# Patient Record
Sex: Male | Born: 1950 | Race: White | Hispanic: No | Marital: Married | Smoking: Current every day smoker
Health system: Southern US, Community
[De-identification: ages and names within clinical notes are randomized; demographics above are authoritative.]

## PROBLEM LIST (undated history)

## (undated) DIAGNOSIS — G4733 Obstructive sleep apnea (adult) (pediatric): Secondary | ICD-10-CM

## (undated) DIAGNOSIS — E119 Type 2 diabetes mellitus without complications: Secondary | ICD-10-CM

## (undated) DIAGNOSIS — K219 Gastro-esophageal reflux disease without esophagitis: Secondary | ICD-10-CM

## (undated) DIAGNOSIS — H269 Unspecified cataract: Secondary | ICD-10-CM

## (undated) DIAGNOSIS — E079 Disorder of thyroid, unspecified: Secondary | ICD-10-CM

## (undated) DIAGNOSIS — C801 Malignant (primary) neoplasm, unspecified: Secondary | ICD-10-CM

## (undated) DIAGNOSIS — G4730 Sleep apnea, unspecified: Secondary | ICD-10-CM

## (undated) DIAGNOSIS — R740 Nonspecific elevation of levels of transaminase and lactic acid dehydrogenase [LDH]: Secondary | ICD-10-CM

## (undated) DIAGNOSIS — R7309 Other abnormal glucose: Secondary | ICD-10-CM

## (undated) DIAGNOSIS — G7000 Myasthenia gravis without (acute) exacerbation: Secondary | ICD-10-CM

## (undated) HISTORY — DX: Myasthenia gravis without (acute) exacerbation: G70.00

## (undated) HISTORY — PX: POLYPECTOMY: SHX149

## (undated) HISTORY — DX: Nonspecific elevation of levels of transaminase and lactic acid dehydrogenase (ldh): R74.0

## (undated) HISTORY — DX: Unspecified cataract: H26.9

## (undated) HISTORY — DX: Sleep apnea, unspecified: G47.30

## (undated) HISTORY — DX: Obstructive sleep apnea (adult) (pediatric): G47.33

## (undated) HISTORY — DX: Disorder of thyroid, unspecified: E07.9

## (undated) HISTORY — DX: Gastro-esophageal reflux disease without esophagitis: K21.9

## (undated) HISTORY — DX: Other abnormal glucose: R73.09

## (undated) HISTORY — DX: Type 2 diabetes mellitus without complications: E11.9

## (undated) HISTORY — DX: Malignant (primary) neoplasm, unspecified: C80.1

---

## 1996-10-06 HISTORY — PX: HERNIA REPAIR: SHX51

## 2004-11-12 ENCOUNTER — Ambulatory Visit (HOSPITAL_BASED_OUTPATIENT_CLINIC_OR_DEPARTMENT_OTHER): Admission: RE | Admit: 2004-11-12 | Discharge: 2004-11-12 | Payer: Self-pay | Admitting: Family Medicine

## 2004-11-12 ENCOUNTER — Ambulatory Visit: Payer: Self-pay | Admitting: Internal Medicine

## 2004-12-03 ENCOUNTER — Encounter: Payer: Self-pay | Admitting: Family Medicine

## 2004-12-03 ENCOUNTER — Ambulatory Visit (HOSPITAL_BASED_OUTPATIENT_CLINIC_OR_DEPARTMENT_OTHER): Admission: RE | Admit: 2004-12-03 | Discharge: 2004-12-03 | Payer: Self-pay | Admitting: Family Medicine

## 2005-10-06 HISTORY — PX: BASAL CELL CARCINOMA EXCISION: SHX1214

## 2009-10-01 ENCOUNTER — Encounter: Payer: Self-pay | Admitting: Family Medicine

## 2009-12-12 ENCOUNTER — Ambulatory Visit: Payer: Self-pay | Admitting: Family Medicine

## 2009-12-12 DIAGNOSIS — K219 Gastro-esophageal reflux disease without esophagitis: Secondary | ICD-10-CM | POA: Insufficient documentation

## 2009-12-12 DIAGNOSIS — R7401 Elevation of levels of liver transaminase levels: Secondary | ICD-10-CM | POA: Insufficient documentation

## 2009-12-12 DIAGNOSIS — R7309 Other abnormal glucose: Secondary | ICD-10-CM

## 2009-12-12 DIAGNOSIS — E1165 Type 2 diabetes mellitus with hyperglycemia: Secondary | ICD-10-CM | POA: Insufficient documentation

## 2009-12-12 DIAGNOSIS — R740 Nonspecific elevation of levels of transaminase and lactic acid dehydrogenase [LDH]: Secondary | ICD-10-CM

## 2009-12-12 HISTORY — DX: Gastro-esophageal reflux disease without esophagitis: K21.9

## 2009-12-12 HISTORY — DX: Other abnormal glucose: R73.09

## 2009-12-12 HISTORY — DX: Elevation of levels of liver transaminase levels: R74.01

## 2010-02-13 ENCOUNTER — Ambulatory Visit: Payer: Self-pay | Admitting: Family Medicine

## 2010-02-13 LAB — CONVERTED CEMR LAB: Blood Glucose, Fingerstick: 125

## 2010-02-14 LAB — CONVERTED CEMR LAB
Albumin: 4.3 g/dL (ref 3.5–5.2)
Alkaline Phosphatase: 62 units/L (ref 39–117)
Bilirubin, Direct: 0.1 mg/dL (ref 0.0–0.3)
Hgb A1c MFr Bld: 6.3 % (ref 4.6–6.5)
Total Protein: 6.8 g/dL (ref 6.0–8.3)

## 2010-03-07 ENCOUNTER — Encounter: Payer: Self-pay | Admitting: Family Medicine

## 2010-04-20 ENCOUNTER — Encounter: Payer: Self-pay | Admitting: Family Medicine

## 2010-08-14 ENCOUNTER — Ambulatory Visit: Payer: Self-pay | Admitting: Family Medicine

## 2010-08-14 DIAGNOSIS — G4733 Obstructive sleep apnea (adult) (pediatric): Secondary | ICD-10-CM | POA: Insufficient documentation

## 2010-08-14 HISTORY — DX: Obstructive sleep apnea (adult) (pediatric): G47.33

## 2010-08-16 LAB — CONVERTED CEMR LAB
ALT: 38 units/L (ref 0–53)
AST: 25 units/L (ref 0–37)
Bilirubin, Direct: 0.1 mg/dL (ref 0.0–0.3)
Hgb A1c MFr Bld: 6.4 % (ref 4.6–6.5)
Total Bilirubin: 0.9 mg/dL (ref 0.3–1.2)

## 2010-08-20 ENCOUNTER — Encounter: Payer: Self-pay | Admitting: Family Medicine

## 2010-11-05 NOTE — Assessment & Plan Note (Signed)
Summary: PT NEW TO [facility]/TO EST/CJR   Vital Signs:  Patient profile:   60 year old male Height:      70.75 inches Weight:      281 pounds BMI:     39.61 Temp:     98.6 degrees F oral Pulse rate:   60 / minute Pulse rhythm:   regular Resp:     12 per minute BP sitting:   130 / 80  (left arm) Cuff size:   large  Vitals Entered By: Sid Falcon LPN (December 12, 1608 10:16 AM)  Nutrition Counseling: Patient's BMI is greater than 25 and therefore counseled on weight management options. CC: New pt to establish   History of Present Illness: New patient to establish care.  Past medical history reviewed. History of basal cell skin cancer 2007. History of mild reflux controlled with over-the-counter ranitidine.  Recent history is that he had life insurance physical significant for elevated glucose 130 with hemoglobin A1c 7.3%. No prior history of diabetes. Denies any thirst, weight loss or urine frequency.  diabetes mother and grandmother. Father had history of atrial fib.  Patient does not exercise.  poor dietary compliance. No visual difficulties.  patient also had mildly elevated liver transaminases on lab work. No alcohol use. No history of risk factors for hepatitis B or C.  Date of last tetanus unknown. No history of Pneumovax. Smokes cigars  Preventive Screening-Counseling & Management  Alcohol-Tobacco     Smoking Status: current     Year Started: 1974  Caffeine-Diet-Exercise     Does Patient Exercise: no  Allergies (verified): No Known Drug Allergies  Past History:  Family History: Last updated: 12/12/2009 Father, heart disease Mother, Type ll diabetes Both grandfathers maternal grandmother, type ll diabetes  Social History: Last updated: 12/12/2009 Occupation:  Dentist Married Current Smoker, cigars Regular exercise-no Alcohol use-no  Risk Factors: Exercise: no (12/12/2009)  Risk Factors: Smoking Status: current (12/12/2009)  Past Medical  History: Skin cancer, face GERD HIV testing  Past Surgical History: Umbilical hernia 1998 MOHS, basal cell CA face 2007  Family History: Father, heart disease Mother, Type ll diabetes Both grandfathers maternal grandmother, type ll diabetes  Social History: Occupation:  Dentist Married Current Smoker, cigars Regular exercise-no Alcohol use-no Smoking Status:  current Occupation:  employed Does Patient Exercise:  no  Review of Systems  The patient denies anorexia, fever, weight loss, weight gain, vision loss, chest pain, syncope, dyspnea on exertion, peripheral edema, prolonged cough, headaches, hemoptysis, abdominal pain, melena, hematochezia, severe indigestion/heartburn, muscle weakness, and suspicious skin lesions.    Physical Exam  General:  Well-developed,well-nourished,in no acute distress; alert,appropriate and cooperative throughout examination Head:  Normocephalic and atraumatic without obvious abnormalities. No apparent alopecia or balding. Eyes:  pupils equal, pupils round, and pupils reactive to light.   Ears:  External ear exam shows no significant lesions or deformities.  Otoscopic examination reveals clear canals, tympanic membranes are intact bilaterally without bulging, retraction, inflammation or discharge. Hearing is grossly normal bilaterally. Mouth:  Oral mucosa and oropharynx without lesions or exudates.  Teeth in good repair. Neck:  No deformities, masses, or tenderness noted. Lungs:  Normal respiratory effort, chest expands symmetrically. Lungs are clear to auscultation, no crackles or wheezes. Heart:  Normal rate and regular rhythm. S1 and S2 normal without gallop, murmur, click, rub or other extra sounds. Abdomen:  soft and non-tender.   Extremities:  No clubbing, cyanosis, edema, or deformity noted with normal full range of motion of all joints.  Neurologic:  alert & oriented X3, cranial nerves II-XII intact, and strength normal in all  extremities.     Impression & Recommendations:  Problem # 1:  HYPERGLYCEMIA (ICD-790.29) patient had a one-time fasting glucose 130. Discussed lifestyle management. Work on weight loss and reduce sugars and starches and reassess fasting glucose within 2 months along with repeat A1c  Problem # 2:  GERD (ICD-530.81)  His updated medication list for this problem includes:    Zantac 150 Mg Tabs (Ranitidine hcl) .Marland Kitchen... At bedtime daily  Problem # 3:  Preventive Health Care (ICD-V70.0) Tdap and Pneumovax recommended and patient consents and these are both given.  Problem # 4:  TRANSAMINASES, SERUM, ELEVATED (ICD-790.4) probably fatty liver related. Recheck in 2 months.  Complete Medication List: 1)  Zantac 150 Mg Tabs (Ranitidine hcl) .... At bedtime daily  Other Orders: Tdap => 40yrs IM (21308) Pneumococcal Vaccine (65784) Admin 1st Vaccine (69629) Admin of Any Addtl Vaccine (52841)  Patient Instructions: 1)  It is important that you exercise reguarly at least 20 minutes 5 times a week. If you develop chest pain, have severe difficulty breathing, or feel very tired, stop exercising immediately and seek medical attention.  2)  You need to lose weight. Consider a lower calorie diet and regular exercise.  3)  Reduce overall sugars and starches and diet. 4)  Please schedule a follow-up appointment in 2 months FASTING and we will plan to repeat A1C, liver enzymes, and Glucose then.   Immunizations Administered:  Tetanus Vaccine:    Vaccine Type: Tdap    Site: left deltoid    Mfr: GlaxoSmithKline    Dose: 0.5 ml    Route: IM    Given by: Sid Falcon LPN    Exp. Date: 12/01/2011    Lot #: [id number]  Pneumonia Vaccine:    Vaccine Type: Pneumovax    Site: right deltoid    Mfr: Merck    Dose: 0.5 ml    Route: IM    Given by: Sid Falcon LPN    Exp. Date: 01/24/2011    Lot #: [id number]

## 2010-11-05 NOTE — Assessment & Plan Note (Signed)
Summary: 6 month rov/njr   Vital Signs:  Patient profile:   60 year old male Weight:      269 pounds Temp:     98.7 degrees F oral BP sitting:   110 / 78  (left arm) Cuff size:   large  Vitals Entered By: Sid Falcon LPN (August 14, 2010 8:21 AM) CBG Result 141   History of Present Illness: Patient seen in followup hyperglycemia. Not monitoring blood sugars at home. Last A1c 6.3%. No symptoms of hyperglycemia. Poor compliance with diet at times. Also history of elevated liver transaminases which have been mildly elevated. History of GERD well-controlled. Obstructive sleep apnea using CPAP consistently but no reported problems.  no regular ETOH use.   Allergies (verified): No Known Drug Allergies  Past History:  Past Medical History: Last updated: 02/13/2010 Basal Cell Ca, face GERD Elevated blood glucose  Past Surgical History: Last updated: 12/12/2009 Umbilical hernia 1998 MOHS, basal cell CA face 2007  Family History: Last updated: 12/12/2009 Father, heart disease Mother, Type ll diabetes Both grandfathers maternal grandmother, type ll diabetes  Social History: Last updated: 12/12/2009 Occupation:  Dentist Married Current Smoker, cigars Regular exercise-no Alcohol use-no  Risk Factors: Exercise: no (12/12/2009)  Risk Factors: Smoking Status: current (02/13/2010) PMH-FH-SH reviewed for relevance  Review of Systems  The patient denies anorexia, fever, weight loss, weight gain, chest pain, syncope, dyspnea on exertion, headaches, hemoptysis, abdominal pain, melena, hematochezia, and severe indigestion/heartburn.    Physical Exam  General:  Well-developed,well-nourished,in no acute distress; alert,appropriate and cooperative throughout examination Mouth:  Oral mucosa and oropharynx without lesions or exudates.  Teeth in good repair. Neck:  No deformities, masses, or tenderness noted. Lungs:  Normal respiratory effort, chest expands  symmetrically. Lungs are clear to auscultation, no crackles or wheezes. Heart:  Normal rate and regular rhythm. S1 and S2 normal without gallop, murmur, click, rub or other extra sounds. Extremities:  No clubbing, cyanosis, edema, or deformity noted with normal full range of motion of all joints.   Psych:  normally interactive, good eye contact, not anxious appearing, and not depressed appearing.     Impression & Recommendations:  Problem # 1:  HYPERGLYCEMIA (ICD-790.29) diet and weight loss discussed. Orders: TLB-A1C / Hgb A1C (Glycohemoglobin) (83036-A1C) Venipuncture (96295) Specimen Handling (28413)  Problem # 2:  TRANSAMINASES, SERUM, ELEVATED (ICD-790.4)  Orders: TLB-Hepatic/Liver Function Pnl (80076-HEPATIC) Venipuncture (24401) Specimen Handling (02725)  Problem # 3:  OBSTRUCTIVE SLEEP APNEA (ICD-327.23)  Problem # 4:  GERD (ICD-530.81)  His updated medication list for this problem includes:    Zantac 150 Mg Tabs (Ranitidine hcl) .Marland Kitchen... At bedtime daily  Complete Medication List: 1)  Zantac 150 Mg Tabs (Ranitidine hcl) .... At bedtime daily  Other Orders: Capillary Blood Glucose/CBG (36644)  Patient Instructions: 1)  Please schedule a follow-up appointment in 6 months .  2)  It is important that you exercise reguarly at least 20 minutes 5 times a week. If you develop chest pain, have severe difficulty breathing, or feel very tired, stop exercising immediately and seek medical attention.  3)  You need to lose weight. Consider a lower calorie diet and regular exercise.    Orders Added: 1)  TLB-A1C / Hgb A1C (Glycohemoglobin) [83036-A1C] 2)  TLB-Hepatic/Liver Function Pnl [80076-HEPATIC] 3)  Capillary Blood Glucose/CBG [82948] 4)  Venipuncture [03474] 5)  Specimen Handling [99000] 6)  Est. Patient Level IV [25956]  Appended Document: 6 month rov/njr    Clinical Lists Changes  Medications: Added new medication  of FREESTYLE FREEDOM LITE W/DEVICE KIT (BLOOD  GLUCOSE MONITORING SUPPL) Disp on 11/9 for BS checks as needed Added new medication of FREESTYLE LANCETS  MISC (LANCETS) as needed Added new medication of FREESTYLE TEST  STRP (GLUCOSE BLOOD) prn

## 2010-11-05 NOTE — Medication Information (Signed)
Summary: Order for CPAP Supplies/[facility]  Order for CPAP Supplies/[facility]   Imported By: Maryln Gottron 03/11/2010 10:25:10  _____________________________________________________________________  External Attachment:    Type:   Image     Comment:   External Document

## 2010-11-05 NOTE — Assessment & Plan Note (Signed)
Summary: 2 month rov/pt will come in fasting for labs/njr   Vital Signs:  Patient profile:   60 year old male Weight:      268 pounds Temp:     97.8 degrees F oral BP sitting:   102 / 70  (left arm) Cuff size:   large  Vitals Entered By: Sid Falcon LPN (Feb 13, 2010 8:23 AM) CC: 2 month follow-up CBG Result 125   History of Present Illness: Followup type 2 diabetes. Recent fasting blood sugar 130 with A1c 7.3%. Patient has lost 13 pounds since last visit with dietary modification. No symptoms of hyperglycemia. Currently on no medications. Has greatly reduced carbohydrate intake. Also recent mild elevation in liver transaminases and we planned followup today.  Preventive Screening-Counseling & Management  Alcohol-Tobacco     Smoking Status: current  Comments: currently smoking an occasional cigar  Allergies (verified): No Known Drug Allergies  Past History:  Past Medical History: Basal Cell Ca, face GERD Elevated blood glucose  Review of Systems  The patient denies chest pain, syncope, dyspnea on exertion, peripheral edema, and abdominal pain.    Physical Exam  General:  Well-developed,well-nourished,in no acute distress; alert,appropriate and cooperative throughout examination Mouth:  Oral mucosa and oropharynx without lesions or exudates.  Teeth in good repair. Neck:  No deformities, masses, or tenderness noted. Lungs:  Normal respiratory effort, chest expands symmetrically. Lungs are clear to auscultation, no crackles or wheezes. Heart:  Normal rate and regular rhythm. S1 and S2 normal without gallop, murmur, click, rub or other extra sounds.   Impression & Recommendations:  Problem # 1:  HYPERGLYCEMIA (ICD-790.29) Assessment Improved  Orders: Glucose, (CBG) (16109) Venipuncture (60454) TLB-A1C / Hgb A1C (Glycohemoglobin) (83036-A1C)  Problem # 2:  TRANSAMINASES, SERUM, ELEVATED (ICD-790.4) prob related to fatty liver changes. Orders: Venipuncture  (09811) TLB-Hepatic/Liver Function Pnl (80076-HEPATIC)  Complete Medication List: 1)  Zantac 150 Mg Tabs (Ranitidine hcl) .... At bedtime daily  Patient Instructions: 1)  It is important that you exercise reguarly at least 20 minutes 5 times a week. If you develop chest pain, have severe difficulty breathing, or feel very tired, stop exercising immediately and seek medical attention.  2)  You need to lose weight. Consider a lower calorie diet and regular exercise.  3)  Check your blood sugars regularly. If your readings are usually above:130  or below 70 you should contact our office.  4)  Please schedule a follow-up appointment in 6 months .

## 2011-02-07 ENCOUNTER — Encounter: Payer: Self-pay | Admitting: Family Medicine

## 2011-02-12 ENCOUNTER — Ambulatory Visit: Payer: Self-pay | Admitting: Family Medicine

## 2011-02-18 ENCOUNTER — Encounter: Payer: Self-pay | Admitting: Family Medicine

## 2011-02-19 ENCOUNTER — Ambulatory Visit (INDEPENDENT_AMBULATORY_CARE_PROVIDER_SITE_OTHER): Payer: PRIVATE HEALTH INSURANCE | Admitting: Family Medicine

## 2011-02-19 ENCOUNTER — Encounter: Payer: Self-pay | Admitting: Family Medicine

## 2011-02-19 DIAGNOSIS — E119 Type 2 diabetes mellitus without complications: Secondary | ICD-10-CM

## 2011-02-19 DIAGNOSIS — R7401 Elevation of levels of liver transaminase levels: Secondary | ICD-10-CM

## 2011-02-19 DIAGNOSIS — R739 Hyperglycemia, unspecified: Secondary | ICD-10-CM

## 2011-02-19 DIAGNOSIS — E785 Hyperlipidemia, unspecified: Secondary | ICD-10-CM

## 2011-02-19 DIAGNOSIS — R7309 Other abnormal glucose: Secondary | ICD-10-CM

## 2011-02-19 LAB — LIPID PANEL
HDL: 33.7 mg/dL — ABNORMAL LOW (ref >39.00)
Total CHOL/HDL Ratio: 4
Triglycerides: 118 mg/dL (ref 0.0–149.0)
VLDL: 23.6 mg/dL (ref 0.0–40.0)

## 2011-02-19 LAB — HEMOGLOBIN A1C: Hgb A1c MFr Bld: 6.4 % (ref 4.6–6.5)

## 2011-02-19 LAB — HEPATIC FUNCTION PANEL
ALT: 46 U/L (ref 0–53)
AST: 28 U/L (ref 0–37)
Albumin: 4.1 g/dL (ref 3.5–5.2)
Total Bilirubin: 0.8 mg/dL (ref 0.3–1.2)

## 2011-02-19 LAB — GLUCOSE, POCT (MANUAL RESULT ENTRY): POC Glucose: 139

## 2011-02-19 NOTE — Patient Instructions (Signed)
Work on weight loss and more consistent exercise.

## 2011-02-19 NOTE — Progress Notes (Signed)
  Subjective:    Patient ID: Rodney Martinez, male    DOB: April 30, 1951, 61 y.o.   MRN: [id number]  HPI Patient has history of type 2 diabetes currently not treated with medication. Not monitoring blood sugars at home. No symptoms of hyperglycemia. Fasting blood sugar here today 139. Last A1c 6.4%. No consistent exercise. Fairly compliant with diet. Also history of obstructive sleep apnea. Uses CPAP and that seems to be working well. History of mildly elevated liver transaminases. No alcohol use.   Review of Systems  Constitutional: Negative for fever, chills, activity change, fatigue and unexpected weight change.  Respiratory: Negative for cough and shortness of breath.   Cardiovascular: Negative for chest pain, palpitations and leg swelling.  Gastrointestinal: Negative for abdominal pain.  Neurological: Negative for dizziness and headaches.       Objective:   Physical Exam  Constitutional: He appears well-developed and well-nourished.  HENT:  Right Ear: External ear normal.  Left Ear: External ear normal.  Neck: Neck supple. No thyromegaly present.  Cardiovascular: Normal rate, regular rhythm and normal heart sounds.   No murmur heard. Pulmonary/Chest: Effort normal and breath sounds normal. No respiratory distress. He has no wheezes. He has no rales.  Musculoskeletal: He exhibits no edema.  Lymphadenopathy:    He has no cervical adenopathy.          Assessment & Plan:  #1 type 2 diabetes. Reassess A1c today #2 history of reported mild hyperlipidemia. Recheck lipids and hepatic panel

## 2011-02-20 NOTE — Progress Notes (Signed)
Quick Note:  Pt wife informed ______ 

## 2011-02-21 NOTE — Procedures (Signed)
NAME:  Rodney Martinez, Rodney Martinez NO.:  [id number]   MEDICAL RECORD NO.:  [id number]          PATIENT TYPE:  OUT   LOCATION:  SLEEP CENTER                 FACILITY:  [facility]   PHYSICIAN:  Clinton D. Maple Hudson, M.D. DATE OF BIRTH:  [birth year]   DATE OF STUDY:  12/03/2004                              NOCTURNAL POLYSOMNOGRAM   REFERRING PHYSICIAN:  Dara Hoyer, MD   INDICATIONS FOR STUDY:  Hypersomnia with sleep apnea. Epworth sleepiness  score 14/24, BMI 37, weight 260 pounds. A diagnostic NPSG on November 12, 2004, recorded RDI of 75 per  hour. CPAP titration is requested.   SLEEP ARCHITECTURE:  Total sleep time 419 minutes with sleep efficiency of  89%. Stage I was 4%, stage II was 80%, stages III and IV were 3%, REM was  12% of total sleep time. Latency to sleep onset 11 minutes. Latency to REM  109 minutes. Awake after sleep onset 39 minutes. Arousal index 13. No  medication taken.   RESPIRATORY DATA:  CPAP titration protocol. CPAP was titrated to 16 CWP, RDI  1.9 per hour, using a large Resmed Ultra Mirage full face mask with heated  humidifier. Most events were stressed with residual snoring and pressures  above 12 CWP. Suggest initial pressure trial of 16 CWP understanding it can  be turned down if a few steps if necessary for comfort at home.   OXYGEN DATA:  Mild to moderate snoring finally suppressed by CPAP. Oxygen  nadir of 74% which corrected by CPAP to a mean oxygen saturation of 94% to  95% on room air.   CARDIAC DATA:  Sinus rhythm with PVCs including some intervals of  ventricular trigeminy not clearly associated with respiratory events.   MOVEMENT/PARASOMNIA:  127 limbs were recorded, but very few of these could  be  identified as causing sleep disturbance. Bathroom times one.   IMPRESSION/RECOMMENDATIONS:  1.  Successful CPAP titration to 16 CWP, RDI of 1.9 per hour  using a large      Resmed Ultra Mirage full face mask with heated humidifier. See comments      above.  2.  Previous diagnostic NPSG on November 12, 2004, recorded an RDI of 75 per      hour.  3.  Short intervals of ventricular trigeminy.    CDY/MEDQ  D:  12/08/2004 08:33:57  T:  12/08/2004 09:20:00  Job:  [id number]

## 2011-02-21 NOTE — Procedures (Signed)
NAME:  Rodney Martinez, Rodney Martinez NO.:  [id number]   MEDICAL RECORD NO.:  [id number]          PATIENT TYPE:  OUT   LOCATION:  SLEEP CENTER                 FACILITY:  [facility]   PHYSICIAN:  Clinton D. Maple Hudson, M.D. DATE OF BIRTH:  03-Dec-1950   DATE OF STUDY:  11/12/2004                              NOCTURNAL POLYSOMNOGRAM   REFFERING PHYSICIAN:  Dara Hoyer, MD   INDICATIONS FOR STUDY:  Hypersomnia with sleep apnea. Epworth sleepiness  score 15/24, BMI 37, weight 260 pounds.   SLEEP ARCHITECTURE:  Total sleep time 335 minutes with sleep efficiency of  75%. Stage I was 5%, stage II was 78%, stages III and IV were absent, REM  was 17% of total sleep time. Latency to sleep onset 99 minutes. Latency to  REM 80 minutes. Awake after sleep onset 12 minutes. Arousal index increased  at 42.   RESPIRATORY DATA:  NPSG protocol. Respiratory disturbance index (RDI) 75.1  per hour indicating severe obstructive sleep apnea/hypopnea syndrome. This  included 129 obstructive apneas and 291 hypopneas. Events were not  positional. REM RDI of 48.   OXYGEN DATA:  Loud snoring with oxygen desaturation to a nadir of 78%. Mean  oxygen saturation through this study was 92% to 93% on room air.   CARDIAC DATA:  Sinus rhythm with frequent PVCs and intervals of ventricular  bigeminy not necessarily associated with apneas.   MOVEMENT/PARASOMNIA:  Occasional leg jerks with insignificant impact on  sleep.   IMPRESSION/RECOMMENDATIONS:  1.  Severe obstructive sleep apnea/hypopnea syndrome, RDI 75.1 per hour with      oxygen desaturation to 78% with loud snoring.  2.  Occasional ventricular bigeminy.  3.  Consider return for CPAP titration or evaluate for alternative therapies      as appropriate.      CDY/MEDQ  D:  11/17/2004 10:46:12  T:  11/17/2004 19:35:11  Job:  [id number]

## 2011-08-22 ENCOUNTER — Ambulatory Visit: Payer: PRIVATE HEALTH INSURANCE | Admitting: Family Medicine

## 2011-09-03 ENCOUNTER — Ambulatory Visit: Payer: PRIVATE HEALTH INSURANCE | Admitting: Family Medicine

## 2011-10-15 ENCOUNTER — Encounter: Payer: Self-pay | Admitting: Family Medicine

## 2011-10-15 ENCOUNTER — Ambulatory Visit (INDEPENDENT_AMBULATORY_CARE_PROVIDER_SITE_OTHER): Payer: PRIVATE HEALTH INSURANCE | Admitting: Family Medicine

## 2011-10-15 VITALS — BP 100/68 | Temp 97.8°F | Wt 278.0 lb

## 2011-10-15 DIAGNOSIS — R7309 Other abnormal glucose: Secondary | ICD-10-CM

## 2011-10-15 LAB — HEMOGLOBIN A1C: Hgb A1c MFr Bld: 6.5 % (ref 4.6–6.5)

## 2011-10-15 NOTE — Progress Notes (Signed)
  Subjective:    Patient ID: Rodney Martinez, male    DOB: Oct 28, 1950, 61 y.o.   MRN: [id number]  HPI  Medical followup. History of hyperglycemia. Most recent A1c 6.4%. Not monitoring home blood sugars. No symptoms of hyperglycemia. Poor compliance with diet and exercise. Has gained 5 pounds since last visit. Continues to smoke cigars. Prior history of mild elevated liver transaminases. No consistent alcohol use. History of GERD stable. Has not had complete physical in quite some time  Review of Systems  Constitutional: Negative for fatigue and unexpected weight change.  Respiratory: Negative for cough and shortness of breath.   Cardiovascular: Negative for chest pain, palpitations and leg swelling.       Objective:   Physical Exam  Constitutional: He appears well-developed and well-nourished.  HENT:  Mouth/Throat: Oropharynx is clear and moist.  Neck: Neck supple.  Cardiovascular: Normal rate and regular rhythm.   Pulmonary/Chest: Effort normal and breath sounds normal. No respiratory distress. He has no wheezes. He has no rales.  Musculoskeletal: He exhibits no edema.  Lymphadenopathy:    He has no cervical adenopathy.          Assessment & Plan:  Type 2 diabetes. History of stable control. Recheck A1c. Schedule complete physical. Work on weight loss

## 2011-10-16 NOTE — Progress Notes (Signed)
Quick Note:  Pt aware ______ 

## 2012-01-14 ENCOUNTER — Encounter: Payer: Self-pay | Admitting: Family Medicine

## 2012-01-14 ENCOUNTER — Ambulatory Visit (INDEPENDENT_AMBULATORY_CARE_PROVIDER_SITE_OTHER): Payer: PRIVATE HEALTH INSURANCE | Admitting: Family Medicine

## 2012-01-14 VITALS — BP 102/62 | HR 60 | Temp 98.1°F | Resp 12 | Ht 71.0 in | Wt 277.0 lb

## 2012-01-14 DIAGNOSIS — E119 Type 2 diabetes mellitus without complications: Secondary | ICD-10-CM

## 2012-01-14 DIAGNOSIS — Z0000 Encounter for general adult medical examination without abnormal findings: Secondary | ICD-10-CM

## 2012-01-14 LAB — CBC WITH DIFFERENTIAL/PLATELET
Basophils Absolute: 0 10*3/uL (ref 0.0–0.1)
Basophils Relative: 0.3 % (ref 0.0–3.0)
Eosinophils Absolute: 0.2 10*3/uL (ref 0.0–0.7)
HCT: 42.4 % (ref 39.0–52.0)
Hemoglobin: 14.4 g/dL (ref 13.0–17.0)
Lymphs Abs: 3.4 10*3/uL (ref 0.7–4.0)
MCHC: 34 g/dL (ref 30.0–36.0)
MCV: 93.7 fl (ref 78.0–100.0)
Monocytes Absolute: 0.5 10*3/uL (ref 0.1–1.0)
Neutro Abs: 3.3 10*3/uL (ref 1.4–7.7)
RBC: 4.53 Mil/uL (ref 4.22–5.81)
RDW: 12.6 % (ref 11.5–14.6)

## 2012-01-14 LAB — LIPID PANEL
Cholesterol: 146 mg/dL (ref 0–200)
Triglycerides: 102 mg/dL (ref 0.0–149.0)

## 2012-01-14 LAB — BASIC METABOLIC PANEL
BUN: 16 mg/dL (ref 6–23)
Chloride: 107 mEq/L (ref 96–112)
Creatinine, Ser: 1 mg/dL (ref 0.4–1.5)
GFR: 79.23 mL/min (ref >60.00)

## 2012-01-14 LAB — POCT URINALYSIS DIPSTICK
Ketones, UA: NEGATIVE
Leukocytes, UA: NEGATIVE
Nitrite, UA: NEGATIVE
Protein, UA: NEGATIVE
Urobilinogen, UA: 0.2

## 2012-01-14 LAB — HEPATIC FUNCTION PANEL
ALT: 55 U/L — ABNORMAL HIGH (ref 0–53)
Albumin: 4.4 g/dL (ref 3.5–5.2)
Bilirubin, Direct: 0.1 mg/dL (ref 0.0–0.3)
Total Protein: 6.8 g/dL (ref 6.0–8.3)

## 2012-01-14 LAB — PSA: PSA: 0.34 ng/mL (ref 0.10–4.00)

## 2012-01-14 NOTE — Progress Notes (Signed)
  Subjective:    Patient ID: Rodney Martinez, male    DOB: November 12, 1950, 61 y.o.   MRN: [id number]  HPI  Patient here for complete physical. He continues to smoke cigars. Type 2 diabetes which has been well controlled. Currently not treated with medications. He has never had shingles vaccine. Tetanus is up-to-date. No history of screening colonoscopy.  He does have some frequent loose stools intermittently for about 2 years now. Never bloody stools. No appetite or weight changes.  Past Medical History  Diagnosis Date  . OBSTRUCTIVE SLEEP APNEA 08/14/2010  . GERD 12/12/2009  . HYPERGLYCEMIA 12/12/2009  . TRANSAMINASES, SERUM, ELEVATED 12/12/2009   Past Surgical History  Procedure Date  . Hernia repair 1998    umbilical  . Basal cell carcinoma excision 2007    face (Mohs)    reports that he has been smoking Cigars.  He does not have any smokeless tobacco history on file. His alcohol and drug histories not on file. family history includes Diabetes in his maternal grandfather, mother, paternal grandfather, and paternal grandmother and Heart disease in his father. No Known Allergies    Review of Systems  Constitutional: Negative for fever, activity change, appetite change and fatigue.  HENT: Negative for ear pain, congestion and trouble swallowing.   Eyes: Negative for pain and visual disturbance.  Respiratory: Negative for cough, shortness of breath and wheezing.   Cardiovascular: Negative for chest pain and palpitations.  Gastrointestinal: Negative for nausea, vomiting, abdominal pain, constipation, blood in stool, abdominal distention and rectal pain.  Genitourinary: Negative for dysuria, hematuria and testicular pain.  Musculoskeletal: Negative for joint swelling and arthralgias.  Skin: Negative for rash.  Neurological: Negative for dizziness, syncope and headaches.  Hematological: Negative for adenopathy.  Psychiatric/Behavioral: Negative for confusion and dysphoric mood.         Objective:   Physical Exam  Constitutional: He is oriented to person, place, and time. He appears well-developed and well-nourished. No distress.  HENT:  Head: Normocephalic and atraumatic.  Right Ear: External ear normal.  Left Ear: External ear normal.  Mouth/Throat: Oropharynx is clear and moist.  Eyes: Conjunctivae and EOM are normal. Pupils are equal, round, and reactive to light.  Neck: Normal range of motion. Neck supple. No thyromegaly present.  Cardiovascular: Normal rate, regular rhythm and normal heart sounds.   No murmur heard. Pulmonary/Chest: No respiratory distress. He has no wheezes. He has no rales.  Abdominal: Soft. Bowel sounds are normal. He exhibits no distension and no mass. There is no tenderness. There is no rebound and no guarding.  Genitourinary: Rectum normal and prostate normal.  Musculoskeletal: He exhibits no edema.  Lymphadenopathy:    He has no cervical adenopathy.  Neurological: He is alert and oriented to person, place, and time. He displays normal reflexes. No cranial nerve deficit.  Skin: No rash noted.  Psychiatric: He has a normal mood and affect.          Assessment & Plan:  Complete physical. Check on coverage for shingles vaccine. Obtain screening lab work. Refer for screening colonoscopy. Diabetic education materials given. Consider baby aspirin one daily

## 2012-01-14 NOTE — Patient Instructions (Signed)
We will set up screening colonoscopy Check on insurance coverage for shingles vaccine Try to quit smoking Consider baby aspirin one daily

## 2012-01-20 NOTE — Progress Notes (Signed)
Quick Note:  Pt informed ______ 

## 2012-02-16 ENCOUNTER — Ambulatory Visit (INDEPENDENT_AMBULATORY_CARE_PROVIDER_SITE_OTHER): Payer: PRIVATE HEALTH INSURANCE | Admitting: Family Medicine

## 2012-02-16 ENCOUNTER — Encounter: Payer: Self-pay | Admitting: Internal Medicine

## 2012-02-16 DIAGNOSIS — Z2911 Encounter for prophylactic immunotherapy for respiratory syncytial virus (RSV): Secondary | ICD-10-CM

## 2012-02-16 DIAGNOSIS — Z299 Encounter for prophylactic measures, unspecified: Secondary | ICD-10-CM

## 2012-04-01 ENCOUNTER — Encounter: Payer: Self-pay | Admitting: Internal Medicine

## 2012-04-01 ENCOUNTER — Ambulatory Visit (AMBULATORY_SURGERY_CENTER): Payer: PRIVATE HEALTH INSURANCE | Admitting: *Deleted

## 2012-04-01 VITALS — Ht 71.0 in | Wt 271.9 lb

## 2012-04-01 DIAGNOSIS — Z1211 Encounter for screening for malignant neoplasm of colon: Secondary | ICD-10-CM

## 2012-04-01 MED ORDER — MOVIPREP 100 G PO SOLR: ORAL | Status: DC

## 2012-04-01 NOTE — Progress Notes (Signed)
No allergies to eggs or soybeans 

## 2012-04-15 ENCOUNTER — Encounter: Payer: Self-pay | Admitting: Internal Medicine

## 2012-04-15 ENCOUNTER — Ambulatory Visit (AMBULATORY_SURGERY_CENTER): Payer: PRIVATE HEALTH INSURANCE | Admitting: Internal Medicine

## 2012-04-15 VITALS — BP 111/71 | HR 50 | Temp 97.2°F | Resp 14 | Ht 71.0 in | Wt 271.0 lb

## 2012-04-15 DIAGNOSIS — D126 Benign neoplasm of colon, unspecified: Secondary | ICD-10-CM

## 2012-04-15 DIAGNOSIS — K635 Polyp of colon: Secondary | ICD-10-CM

## 2012-04-15 DIAGNOSIS — Z1211 Encounter for screening for malignant neoplasm of colon: Secondary | ICD-10-CM

## 2012-04-15 HISTORY — PX: COLONOSCOPY: SHX174

## 2012-04-15 MED ORDER — SODIUM CHLORIDE 0.9 % IV SOLN: 500.0000 mL | INTRAVENOUS | Status: DC

## 2012-04-15 NOTE — Progress Notes (Signed)
Patient did not have preoperative order for IV antibiotic SSI prophylaxis. (G8918)  Patient did not experience any of the following events: a burn prior to discharge; a fall within the facility; wrong site/side/patient/procedure/implant event; or a hospital transfer or hospital admission upon discharge from the facility. (G8907)  

## 2012-04-15 NOTE — Op Note (Signed)
[facility] [address] [place], [place]  [address]  COLONOSCOPY PROCEDURE REPORT  PATIENT:  Rodney Martinez, Rodney Martinez  MR#:  [id number] BIRTHDATE:  [birth year], 61 yrs. old  GENDER:  male ENDOSCOPIST:  Carie Caddy. Amaria Mundorf, MD REF. BY:  Evelena Peat, M.D. PROCEDURE DATE:  04/15/2012 PROCEDURE:  Colonoscopy with snare polypectomy, Colon with cold biopsy polypectomy ASA CLASS:  Class II INDICATIONS:  Routine Risk Screening, 1st colonoscopy MEDICATIONS:   MAC sedation, administered by CRNA, propofol (Diprivan) 200 mg IV  DESCRIPTION OF PROCEDURE:   After the risks benefits and alternatives of the procedure were thoroughly explained, informed consent was obtained.  Digital rectal exam was performed and revealed no rectal masses.   The LB CF-[id number] endoscope was introduced through the anus and advanced to the cecum, which was identified by both the appendix and ileocecal valve, without limitations.  The quality of the prep was good, using MoviPrep. The instrument was then slowly withdrawn as the colon was fully examined. <<PROCEDUREIMAGES>>  FINDINGS:  Three sessile polyps were found in the ascending colon (1) and transverse colon (2), measured 3 -5 mm. The polyps were removed using cold biopsy forceps.  A 7 mm sessile polyp was found in the sigmoid colon. Polyp was snared without cautery. Retrieval was successful. A 3 mm sessile polyp was found in the rectum. The polyp was removed using cold biopsy forceps.  Moderate diverticulosis was found in the sigmoid colon.   Retroflexed views in the rectum revealed no abnormalities.   The scope was then withdrawn from the cecum and the procedure completed.  COMPLICATIONS:  None ENDOSCOPIC IMPRESSION: 1) Three polyps in the ascending colon and transverse colon. Removed and sent to pathology. 2) Sessile polyp in the sigmoid colon. Removed and sent to pathology. 3) Sessile polyp in the rectum. Removed and sent to pathology. 4)  Moderate diverticulosis in the sigmoid colon  RECOMMENDATIONS: 1) Hold aspirin, aspirin products, and anti-inflammatory medication for 1 week. 2) Await pathology results 3) High fiber diet. 4) If the polyps removed today are proven to be adenomatous (pre-cancerous) polyps, you will need a colonoscopy in 3 years. Otherwise you should continue to follow colorectal cancer screening guidelines for "routine risk" patients with a colonoscopy in 10 years. You will receive a letter within 1-2 weeks with the results of your biopsy as well as final recommendations. Please call my office if you have not received a letter after 3 weeks.  Carie Caddy. Rhea Belton, MD  CC:  The Patient Evelena Peat, MD  n. eSIGNEDCarie Caddy. Emer Onnen at 04/15/2012 09:29 AM  Loraine Grip, [phone]

## 2012-04-15 NOTE — Patient Instructions (Addendum)
YOU HAD AN ENDOSCOPIC PROCEDURE TODAY AT THE [facility]: Refer to the procedure report that was given to you for any specific questions about what was found during the examination.  If the procedure report does not answer your questions, please call your gastroenterologist to clarify.  If you requested that your care partner not be given the details of your procedure findings, then the procedure report has been included in a sealed envelope for you to review at your convenience later.  YOU SHOULD EXPECT: Some feelings of bloating in the abdomen. Passage of more gas than usual.  Walking can help get rid of the air that was put into your GI tract during the procedure and reduce the bloating. If you had a lower endoscopy (such as a colonoscopy or flexible sigmoidoscopy) you may notice spotting of blood in your stool or on the toilet paper. If you underwent a bowel prep for your procedure, then you may not have a normal bowel movement for a few days.  DIET: Your first meal following the procedure should be a light meal and then it is ok to progress to your normal diet.  A half-sandwich or bowl of soup is an example of a good first meal.  Heavy or fried foods are harder to digest and may make you feel nauseous or bloated.  Likewise meals heavy in dairy and vegetables can cause extra gas to form and this can also increase the bloating.  Drink plenty of fluids but you should avoid alcoholic beverages for 24 hours.  ACTIVITY: Your care partner should take you home directly after the procedure.  You should plan to take it easy, moving slowly for the rest of the day.  You can resume normal activity the day after the procedure however you should NOT DRIVE or use heavy machinery for 24 hours (because of the sedation medicines used during the test).    SYMPTOMS TO REPORT IMMEDIATELY: A gastroenterologist can be reached at any hour.  During normal business hours, 8:30 AM to 5:00 PM Monday through Friday,  call [phone].  After hours and on weekends, please call the GI answering service at [phone] who will take a message and have the physician on call contact you.   Following lower endoscopy (colonoscopy or flexible sigmoidoscopy):  Excessive amounts of blood in the stool  Significant tenderness or worsening of abdominal pains  Swelling of the abdomen that is new, acute  Fever of 100F or higher    FOLLOW UP: If any biopsies were taken you will be contacted by phone or by letter within the next 1-3 weeks.  Call your gastroenterologist if you have not heard about the biopsies in 3 weeks.  Our staff will call the home number listed on your records the next business day following your procedure to check on you and address any questions or concerns that you may have at that time regarding the information given to you following your procedure. This is a courtesy call and so if there is no answer at the home number and we have not heard from you through the emergency physician on call, we will assume that you have returned to your regular daily activities without incident.  SIGNATURES/CONFIDENTIALITY: You and/or your care partner have signed paperwork which will be entered into your electronic medical record.  These signatures attest to the fact that that the information above on your After Visit Summary has been reviewed and is understood.  Full responsibility of the confidentiality   of this discharge information lies with you and/or your care-partner.     

## 2012-04-15 NOTE — Progress Notes (Signed)
Called to check on patient and there was no answer.  Message left to call us if necessary left on machine.

## 2012-04-16 ENCOUNTER — Telehealth: Payer: Self-pay | Admitting: *Deleted

## 2012-04-16 NOTE — Telephone Encounter (Signed)
  Follow up Call-  Call back number 04/15/2012  Post procedure Call Back phone  # [phone]  Permission to leave phone message Yes     Patient questions:  Message left to call us if necessary.

## 2012-04-20 ENCOUNTER — Encounter: Payer: Self-pay | Admitting: Internal Medicine

## 2013-06-17 ENCOUNTER — Telehealth: Payer: Self-pay | Admitting: Family Medicine

## 2013-06-17 NOTE — Telephone Encounter (Signed)
Pt needs rx for cpap cushion for his cpap mask [facility] to [facility] [phone].

## 2013-06-17 NOTE — Telephone Encounter (Signed)
OK to provide. 

## 2013-06-22 NOTE — Telephone Encounter (Signed)
Patient calling back. He states he has not received the cushion and is concerned we did not fax the rx over to [facility]. Please fax rx for CPAP cushion to [facility] [phone]) dx: sleep apnea. Thank you.

## 2013-06-23 NOTE — Telephone Encounter (Signed)
Faxed the RX

## 2013-10-19 ENCOUNTER — Other Ambulatory Visit: Payer: Self-pay | Admitting: Dermatology

## 2013-10-19 DIAGNOSIS — C4492 Squamous cell carcinoma of skin, unspecified: Secondary | ICD-10-CM

## 2013-10-19 HISTORY — DX: Squamous cell carcinoma of skin, unspecified: C44.92

## 2013-12-05 ENCOUNTER — Ambulatory Visit (INDEPENDENT_AMBULATORY_CARE_PROVIDER_SITE_OTHER): Payer: Medicare Other | Admitting: Family Medicine

## 2013-12-05 ENCOUNTER — Encounter: Payer: Self-pay | Admitting: Family Medicine

## 2013-12-05 VITALS — BP 110/62 | HR 61 | Temp 98.4°F | Wt 278.0 lb

## 2013-12-05 DIAGNOSIS — R7309 Other abnormal glucose: Secondary | ICD-10-CM

## 2013-12-05 DIAGNOSIS — Z79899 Other long term (current) drug therapy: Secondary | ICD-10-CM

## 2013-12-05 DIAGNOSIS — B351 Tinea unguium: Secondary | ICD-10-CM

## 2013-12-05 LAB — HEPATIC FUNCTION PANEL
ALBUMIN: 4.2 g/dL (ref 3.5–5.2)
ALT: 79 U/L — AB (ref 0–53)
AST: 41 U/L — AB (ref 0–37)
Alkaline Phosphatase: 52 U/L (ref 39–117)
Bilirubin, Direct: 0 mg/dL (ref 0.0–0.3)
Total Bilirubin: 0.7 mg/dL (ref 0.3–1.2)
Total Protein: 7.1 g/dL (ref 6.0–8.3)

## 2013-12-05 LAB — HEMOGLOBIN A1C: HEMOGLOBIN A1C: 7.2 % — AB (ref 4.6–6.5)

## 2013-12-05 MED ORDER — TERBINAFINE HCL 250 MG PO TABS: 250.0000 mg | ORAL_TABLET | Freq: Every day | ORAL | Status: DC

## 2013-12-05 NOTE — Progress Notes (Signed)
   Subjective:    Patient ID: Rodney Martinez, male    DOB: Feb 04, 1951, 63 y.o.   MRN: [id number]  HPI Patient is seen with thickened brittle changes right great toe. He has some mild associated discomfort at times. No drainage. He has history of mildly elevated blood sugars in the past. He has not had blood sugar checked in some time. He is noted the nail changes for several months and these are progressive. No prior history of onychomycosis. Patient does smoke. No history of peripheral vascular disease.  Past Medical History  Diagnosis Date  . OBSTRUCTIVE SLEEP APNEA 08/14/2010  . GERD 12/12/2009  . HYPERGLYCEMIA 12/12/2009  . TRANSAMINASES, SERUM, ELEVATED 12/12/2009   Past Surgical History  Procedure Laterality Date  . Hernia repair  [id number]    umbilical  . Basal cell carcinoma excision  2007    face (Mohs)    reports that he has been smoking Cigars.  He has never used smokeless tobacco. He reports that he drinks alcohol. He reports that he does not use illicit drugs. family history includes Diabetes in his maternal grandfather, mother, paternal grandfather, and paternal grandmother; Heart disease in his father. There is no history of Colon cancer or Stomach cancer. No Known Allergies    Review of Systems  Constitutional: Negative for fever and chills.       Objective:   Physical Exam  Constitutional: He appears well-developed and well-nourished.  Cardiovascular: Normal rate.   Pulmonary/Chest: Effort normal and breath sounds normal. No respiratory distress. He has no wheezes. He has no rales.  Musculoskeletal:  Foot pulses are normal bilaterally  Skin:  Patient has some thickened brittle changes of the right great toenail. No surrounding skin changes.          Assessment & Plan:  Dysmorphic right great toenail. Strongly suspect onychomycosis. Patient has history of borderline diabetes. Reassess hemoglobin A1c. Check hepatic panel. If normal, start Lamisil 250 mg once daily  for 3 months

## 2013-12-05 NOTE — Patient Instructions (Signed)
Ringworm, Nail A fungal infection of the nail (tinea unguium/onychomycosis) is common. It is common as the visible part of the nail is composed of dead cells which have no blood supply to help prevent infection. It occurs because fungi are everywhere and will pick any opportunity to grow on any dead material. Because nails are very slow growing they require up to 2 years of treatment with anti-fungal medications. The entire nail back to the base is infected. This includes approximately  of the nail which you cannot see. If your caregiver has prescribed a medication by mouth, take it every day and as directed. No progress will be seen for at least 6 to 9 months. Do not be disappointed! Because fungi live on dead cells with little or no exposure to blood supply, medication delivery to the infection is slow; thus the cure is slow. It is also why you can observe no progress in the first 6 months. The nail becoming cured is the base of the nail, as it has the blood supply. Topical medication such as creams and ointments are usually not effective. Important in successful treatment of nail fungus is closely following the medication regimen that your doctor prescribes. Sometimes you and your caregiver may elect to speed up this process by surgical removal of all the nails. Even this may still require 6 to 9 months of additional oral medications. See your caregiver as directed. Remember there will be no visible improvement for at least 6 months. See your caregiver sooner if other signs of infection (redness and swelling) develop. Document Released: 09/19/2000 Document Revised: 12/15/2011 Document Reviewed: 11/28/2008 ExitCare Patient Information 2014 [employer].  

## 2013-12-05 NOTE — Progress Notes (Signed)
Pre visit review using our clinic review tool, if applicable. No additional management support is needed unless otherwise documented below in the visit note. 

## 2013-12-06 ENCOUNTER — Telehealth: Payer: Self-pay | Admitting: Family Medicine

## 2013-12-06 NOTE — Telephone Encounter (Signed)
Relevant patient education assigned to patient using Emmi. ° °

## 2014-01-10 ENCOUNTER — Other Ambulatory Visit: Payer: Self-pay | Admitting: Family Medicine

## 2014-01-10 DIAGNOSIS — R7309 Other abnormal glucose: Secondary | ICD-10-CM

## 2014-01-10 DIAGNOSIS — Z79899 Other long term (current) drug therapy: Secondary | ICD-10-CM

## 2014-02-15 LAB — HM DIABETES EYE EXAM

## 2014-02-24 ENCOUNTER — Telehealth: Payer: Self-pay | Admitting: Family Medicine

## 2014-02-24 DIAGNOSIS — H9190 Unspecified hearing loss, unspecified ear: Secondary | ICD-10-CM

## 2014-02-24 NOTE — Telephone Encounter (Signed)
Clarify.  If sudden loss would recommend ENT.  If gradual and bilateral should probably see audiologist.

## 2014-02-24 NOTE — Telephone Encounter (Signed)
Pt called and informed me this has been a gradual problem.

## 2014-02-24 NOTE — Telephone Encounter (Signed)
Left message for patient to return call.

## 2014-02-24 NOTE — Telephone Encounter (Signed)
Pt states he has hearing loss that has gotten progressively worse and pt would like to see a specialist.  Would like a referral.

## 2014-04-13 ENCOUNTER — Encounter: Payer: Self-pay | Admitting: Family Medicine

## 2014-10-11 ENCOUNTER — Other Ambulatory Visit: Payer: Self-pay | Admitting: Dermatology

## 2015-02-21 ENCOUNTER — Encounter: Payer: Self-pay | Admitting: Internal Medicine

## 2016-04-02 ENCOUNTER — Telehealth: Payer: Self-pay | Admitting: Family Medicine

## 2016-04-02 NOTE — Telephone Encounter (Signed)
lmom for pt to call the office to make an appointment.

## 2016-04-02 NOTE — Telephone Encounter (Signed)
Pt has not been seen here in the office since 2015. Please schedule him a follow appt morning appt to review his medications. Dr. Elease Hashimoto will then decide what labs to be done. Thanks.

## 2016-04-02 NOTE — Telephone Encounter (Signed)
Pt would like to have his PSA and A1C checked.  May I have a order put in for this?

## 2016-04-04 NOTE — Telephone Encounter (Signed)
Pt scheduled  

## 2016-04-07 ENCOUNTER — Other Ambulatory Visit: Payer: Self-pay

## 2016-04-07 ENCOUNTER — Ambulatory Visit (INDEPENDENT_AMBULATORY_CARE_PROVIDER_SITE_OTHER): Payer: BLUE CROSS/BLUE SHIELD | Admitting: Family Medicine

## 2016-04-07 ENCOUNTER — Encounter: Payer: Self-pay | Admitting: Family Medicine

## 2016-04-07 VITALS — BP 100/70 | HR 71 | Temp 98.0°F | Resp 16 | Ht 71.0 in | Wt 264.0 lb

## 2016-04-07 DIAGNOSIS — R740 Nonspecific elevation of levels of transaminase and lactic acid dehydrogenase [LDH]: Secondary | ICD-10-CM | POA: Diagnosis not present

## 2016-04-07 DIAGNOSIS — R7309 Other abnormal glucose: Secondary | ICD-10-CM

## 2016-04-07 DIAGNOSIS — R5383 Other fatigue: Secondary | ICD-10-CM | POA: Diagnosis not present

## 2016-04-07 DIAGNOSIS — R7401 Elevation of levels of liver transaminase levels: Secondary | ICD-10-CM

## 2016-04-07 DIAGNOSIS — R350 Frequency of micturition: Secondary | ICD-10-CM | POA: Diagnosis not present

## 2016-04-07 LAB — CBC WITH DIFFERENTIAL/PLATELET
Basophils Absolute: 0 10*3/uL (ref 0.0–0.1)
Basophils Relative: 0.3 % (ref 0.0–3.0)
EOS PCT: 2.4 % (ref 0.0–5.0)
Eosinophils Absolute: 0.2 10*3/uL (ref 0.0–0.7)
HCT: 44.6 % (ref 39.0–52.0)
HEMOGLOBIN: 15.1 g/dL (ref 13.0–17.0)
Lymphocytes Relative: 45 % (ref 12.0–46.0)
Lymphs Abs: 4.6 10*3/uL — ABNORMAL HIGH (ref 0.7–4.0)
MCHC: 34 g/dL (ref 30.0–36.0)
MCV: 92.6 fl (ref 78.0–100.0)
Monocytes Absolute: 0.5 10*3/uL (ref 0.1–1.0)
Monocytes Relative: 4.7 % (ref 3.0–12.0)
Neutro Abs: 4.8 10*3/uL (ref 1.4–7.7)
Neutrophils Relative %: 47.6 % (ref 43.0–77.0)
Platelets: 170 10*3/uL (ref 150.0–400.0)
RBC: 4.82 Mil/uL (ref 4.22–5.81)
RDW: 12.8 % (ref 11.5–15.5)
WBC: 10.1 10*3/uL (ref 4.0–10.5)

## 2016-04-07 LAB — HEPATIC FUNCTION PANEL
ALBUMIN: 4.6 g/dL (ref 3.5–5.2)
ALT: 51 U/L (ref 0–53)
AST: 30 U/L (ref 0–37)
Alkaline Phosphatase: 62 U/L (ref 39–117)
BILIRUBIN DIRECT: 0.1 mg/dL (ref 0.0–0.3)
BILIRUBIN TOTAL: 0.4 mg/dL (ref 0.2–1.2)
Total Protein: 7.1 g/dL (ref 6.0–8.3)

## 2016-04-07 LAB — BASIC METABOLIC PANEL
BUN: 18 mg/dL (ref 6–23)
CO2: 28 mEq/L (ref 19–32)
Calcium: 9.4 mg/dL (ref 8.4–10.5)
Chloride: 106 mEq/L (ref 96–112)
Creatinine, Ser: 1.01 mg/dL (ref 0.40–1.50)
GFR: 78.19 mL/min (ref >60.00)
Glucose, Bld: 152 mg/dL — ABNORMAL HIGH (ref 70–99)
POTASSIUM: 4.5 meq/L (ref 3.5–5.1)
SODIUM: 138 meq/L (ref 135–145)

## 2016-04-07 LAB — TSH: TSH: 4.56 u[IU]/mL — ABNORMAL HIGH (ref 0.35–4.50)

## 2016-04-07 LAB — HEMOGLOBIN A1C: Hgb A1c MFr Bld: 7.1 % — ABNORMAL HIGH (ref 4.6–6.5)

## 2016-04-07 LAB — TESTOSTERONE: Testosterone: 293.6 ng/dL — ABNORMAL LOW (ref 300.00–890.00)

## 2016-04-07 LAB — PSA: PSA: 0.41 ng/mL (ref 0.10–4.00)

## 2016-04-07 NOTE — Progress Notes (Signed)
   Subjective:    Patient ID: Rodney Martinez, male    DOB: 1950-11-19, 65 y.o.   MRN: [id number]  HPI  Patient seen for medical follow-up He has history of obesity, obstructive sleep apnea, mild elevated liver transaminases, hyperglycemia, and GERD Not monitoring blood sugars. Last A1c 7.2% and this was over 2 years ago.  Recent mild nocturia. Usually gets up 1-3 times per night. Denies any slow stream. Last PSA 4 years ago 0.34. No burning with urination. Denies any daytime polyuria or polydipsia.  Nonspecific fatigue. 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 No chest pains. No dyspnea. Sleeping fairly well. No depression issues. Obstructive sleep apnea and remains on CPAP. He feels his CPAP is still working well and generally feels rested when he gets up each morning  Past Medical History  Diagnosis Date  . OBSTRUCTIVE SLEEP APNEA 08/14/2010  . GERD 12/12/2009  . HYPERGLYCEMIA 12/12/2009  . TRANSAMINASES, SERUM, ELEVATED 12/12/2009   Past Surgical History  Procedure Laterality Date  . Hernia repair  [id number]    umbilical  . Basal cell carcinoma excision  2007    face (Mohs)    reports that he has been smoking Cigars.  He has never used smokeless tobacco. He reports that he drinks alcohol. He reports that he does not use illicit drugs. family history includes Diabetes in his maternal grandfather, mother, paternal grandfather, and paternal grandmother; Heart disease in his father. There is no history of Colon cancer or Stomach cancer. No Known Allergies   Review of Systems  Constitutional: Positive for fatigue. Negative for fever, chills, appetite change and unexpected weight change.  Respiratory: Negative for cough, shortness of breath and wheezing.   Cardiovascular: Negative for chest pain, palpitations and leg swelling.  Gastrointestinal: Negative for abdominal pain.  Genitourinary:  Negative for hematuria, flank pain and decreased urine volume.  Musculoskeletal: Negative for back pain.  Psychiatric/Behavioral: Negative for dysphoric mood.       Objective:   Physical Exam  Constitutional: He appears well-developed and well-nourished.  Neck: Neck supple. No thyromegaly present.  Cardiovascular: Normal rate and regular rhythm.   Pulmonary/Chest: Effort normal.  Slightly diminished breath sounds throughout but clear  Abdominal: Soft. Bowel sounds are normal. He exhibits no distension and no mass. There is no tenderness. There is no rebound and no guarding.  Genitourinary:  Prostate is not enlarged. Symmetric with no nodules  Musculoskeletal: He exhibits no edema.          Assessment & Plan:  #1 fatigue. Etiology unclear. He does have obstructive sleep apnea but uses CPAP regularly. Will check further labs including CBC, chemistries, TSH, testosterone level  #2 history of hyperglycemia. Recheck A1c and fasting glucose. Consider metformin if A1c climbing  #3 history of mild elevated liver transaminases. Probably related to fatty liver changes. Recheck  #4 mild nocturia. Doubt obstructive urinary issue. May have some mild urgency. At this point not interested in medications.  [name] MD [facility] at [facility]

## 2016-04-09 ENCOUNTER — Other Ambulatory Visit: Payer: Self-pay | Admitting: Family Medicine

## 2016-04-09 DIAGNOSIS — Z0000 Encounter for general adult medical examination without abnormal findings: Secondary | ICD-10-CM

## 2016-05-12 ENCOUNTER — Other Ambulatory Visit (INDEPENDENT_AMBULATORY_CARE_PROVIDER_SITE_OTHER): Payer: Medicare Other

## 2016-05-12 DIAGNOSIS — Z0000 Encounter for general adult medical examination without abnormal findings: Secondary | ICD-10-CM

## 2016-05-12 LAB — TESTOSTERONE: TESTOSTERONE: 316.88 ng/dL (ref 300.00–890.00)

## 2016-05-12 LAB — TSH: TSH: 5.14 u[IU]/mL — AB (ref 0.35–4.50)

## 2016-05-12 LAB — T4, FREE: Free T4: 0.71 ng/dL (ref 0.60–1.60)

## 2016-05-16 ENCOUNTER — Other Ambulatory Visit: Payer: Self-pay

## 2016-05-16 DIAGNOSIS — E039 Hypothyroidism, unspecified: Secondary | ICD-10-CM

## 2016-05-16 MED ORDER — LEVOTHYROXINE SODIUM 25 MCG PO TABS: 25.0000 ug | ORAL_TABLET | Freq: Every day | ORAL | 5 refills | Status: DC

## 2016-06-12 DIAGNOSIS — H1013 Acute atopic conjunctivitis, bilateral: Secondary | ICD-10-CM | POA: Diagnosis not present

## 2016-08-22 ENCOUNTER — Other Ambulatory Visit (INDEPENDENT_AMBULATORY_CARE_PROVIDER_SITE_OTHER): Payer: Medicare Other

## 2016-08-22 DIAGNOSIS — E039 Hypothyroidism, unspecified: Secondary | ICD-10-CM | POA: Diagnosis not present

## 2016-08-22 LAB — TSH: TSH: 2.99 u[IU]/mL (ref 0.35–4.50)

## 2016-08-24 ENCOUNTER — Encounter: Payer: Self-pay | Admitting: Family Medicine

## 2016-10-29 DIAGNOSIS — D229 Melanocytic nevi, unspecified: Secondary | ICD-10-CM | POA: Diagnosis not present

## 2016-10-29 DIAGNOSIS — Z85828 Personal history of other malignant neoplasm of skin: Secondary | ICD-10-CM | POA: Diagnosis not present

## 2016-10-29 DIAGNOSIS — L821 Other seborrheic keratosis: Secondary | ICD-10-CM | POA: Diagnosis not present

## 2016-10-29 DIAGNOSIS — L570 Actinic keratosis: Secondary | ICD-10-CM | POA: Diagnosis not present

## 2016-12-01 ENCOUNTER — Other Ambulatory Visit: Payer: Self-pay | Admitting: Family Medicine

## 2017-05-01 ENCOUNTER — Other Ambulatory Visit: Payer: Self-pay | Admitting: Family Medicine

## 2017-06-25 ENCOUNTER — Encounter: Payer: Self-pay | Admitting: Family Medicine

## 2017-07-28 DIAGNOSIS — L723 Sebaceous cyst: Secondary | ICD-10-CM | POA: Diagnosis not present

## 2017-07-28 DIAGNOSIS — L089 Local infection of the skin and subcutaneous tissue, unspecified: Secondary | ICD-10-CM | POA: Diagnosis not present

## 2017-08-17 DIAGNOSIS — L723 Sebaceous cyst: Secondary | ICD-10-CM | POA: Diagnosis not present

## 2017-10-21 ENCOUNTER — Encounter: Payer: Medicare Other | Admitting: Family Medicine

## 2017-10-25 ENCOUNTER — Other Ambulatory Visit: Payer: Self-pay | Admitting: Family Medicine

## 2017-10-26 ENCOUNTER — Ambulatory Visit: Payer: Self-pay | Admitting: *Deleted

## 2017-10-26 NOTE — Telephone Encounter (Signed)
Pt calling stating that he has experienced tongue swelling off and on since last Thursday. Pt denies any pain, discoloration or difficulty breathing. Pt is hard to understand with talking due to tongue swelling. Pt denies trying any new food or being prescribed new medications Pt calling and wanted to make an appt to be seen. Pt advised to go to Urgent Care tonight to be evaluated. Pt verbalized understanding and agreed to be seen for treatment.  Reason for Disposition . All other adults with swollen tongue   (Exception: tongue swelling is a recurrent problem AND NO swelling at present)  Answer Assessment - Initial Assessment Questions 1. ONSET: "When did the swelling start?" (e.g., minutes, hours, days)     Thursday 2. LOCATION: "What part of the tongue is swollen?"     Whole tongue 3. SEVERITY: "How swollen is it?"     Pt has difficulty talking 4. CAUSE: "What do you think is causing the tongue swelling?" (e.g., hx of angioedema, allergies)     unknkown 5. RECURRENT SYMPTOM: "Have you had tongue swelling before?" If so, ask: "When was the last time?" "What happened that time?"     Pt states the swelling has been off and on since last Thursday 6. OTHER SYMPTOMS: "Do you have any other symptoms?" (e.g., difficulty breathing, facial swelling)     No, pt denies other symptoms  Protocols used: TONGUE SWELLING-A-AH

## 2017-10-27 ENCOUNTER — Ambulatory Visit (HOSPITAL_COMMUNITY)
Admission: RE | Admit: 2017-10-27 | Discharge: 2017-10-27 | Disposition: A | Discharge status: Home / Self Care | Payer: Medicare Other | Source: Ambulatory Visit | Attending: Family Medicine | Admitting: Family Medicine

## 2017-10-27 ENCOUNTER — Encounter: Payer: Self-pay | Admitting: Family Medicine

## 2017-10-27 ENCOUNTER — Ambulatory Visit: Payer: Medicare Other | Admitting: Family Medicine

## 2017-10-27 ENCOUNTER — Telehealth: Payer: Self-pay | Admitting: Family Medicine

## 2017-10-27 VITALS — BP 128/68 | HR 64 | Temp 98.2°F | Wt 271.0 lb

## 2017-10-27 DIAGNOSIS — R4781 Slurred speech: Secondary | ICD-10-CM

## 2017-10-27 DIAGNOSIS — G1220 Motor neuron disease, unspecified: Secondary | ICD-10-CM | POA: Diagnosis not present

## 2017-10-27 LAB — CBC WITH DIFFERENTIAL/PLATELET
BASOS ABS: 0 10*3/uL (ref 0.0–0.1)
BASOS PCT: 0.3 % (ref 0.0–3.0)
EOS ABS: 0.3 10*3/uL (ref 0.0–0.7)
Eosinophils Relative: 2.9 % (ref 0.0–5.0)
HEMATOCRIT: 43.7 % (ref 39.0–52.0)
HEMOGLOBIN: 15 g/dL (ref 13.0–17.0)
LYMPHS PCT: 52.9 % — AB (ref 12.0–46.0)
Lymphs Abs: 4.8 10*3/uL — ABNORMAL HIGH (ref 0.7–4.0)
MCHC: 34.4 g/dL (ref 30.0–36.0)
MCV: 93.8 fl (ref 78.0–100.0)
MONOS PCT: 6.7 % (ref 3.0–12.0)
Monocytes Absolute: 0.6 10*3/uL (ref 0.1–1.0)
NEUTROS ABS: 3.4 10*3/uL (ref 1.4–7.7)
Neutrophils Relative %: 37.2 % — ABNORMAL LOW (ref 43.0–77.0)
Platelets: 183 10*3/uL (ref 150.0–400.0)
RBC: 4.65 Mil/uL (ref 4.22–5.81)
RDW: 12.6 % (ref 11.5–15.5)
WBC: 9.1 10*3/uL (ref 4.0–10.5)

## 2017-10-27 LAB — BASIC METABOLIC PANEL
BUN: 22 mg/dL (ref 6–23)
CHLORIDE: 105 meq/L (ref 96–112)
CO2: 26 mEq/L (ref 19–32)
CREATININE: 0.91 mg/dL (ref 0.40–1.50)
Calcium: 9 mg/dL (ref 8.4–10.5)
GFR: 87.78 mL/min (ref >60.00)
GLUCOSE: 163 mg/dL — AB (ref 70–99)
POTASSIUM: 4.1 meq/L (ref 3.5–5.1)
Sodium: 137 mEq/L (ref 135–145)

## 2017-10-27 MED ORDER — GADOBENATE DIMEGLUMINE 529 MG/ML IV SOLN
20.0000 mL | Freq: Once | INTRAVENOUS | Status: AC | PRN
  Administered 2017-10-27: 20 mL via INTRAVENOUS

## 2017-10-27 NOTE — Patient Instructions (Signed)
Stat labs  We will get you set up for a brain scan MRI stat

## 2017-10-27 NOTE — Progress Notes (Signed)
Rodney Martinez is a 67 year old married male smoker....... Cigars...Marland KitchenMarland KitchenMarland Kitchen who comes in acutely because of difficulty speaking  He noticed last Thursday he had difficulty pronouncing words. His wife is a Animal nutritionist also. She did some neurologic tests and felt he was otherwise well. Last night it seemed to get worse he went to a local urgent care on bowel program. They recommend he go the hospital and have imaging studies done. He declined. He's here today for further evaluation.  He's never any neurologic problems in the past. No history of trauma.  Medications he takes thyroid once daily 25 g and melatonin for sleep.  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  Neurologic exam cranial nerves II through XII are intact except he has slurred speech. Sensation muscle strength reflexes gait all within normal limits. He is oriented 3  #1 slurred speech for 6 days..........Marland Kitchen begin neurologic workup.

## 2017-10-27 NOTE — Telephone Encounter (Signed)
Reference number was put in order.

## 2017-10-27 NOTE — Telephone Encounter (Signed)
Copied from [place] [id number]. Topic: Referral - Request >> Oct 27, 2017  1:44 PM Robina Ade, Helene Kelp D wrote: Reason for CRM: Pam from [place] called and needs to talk to Dr. Sherren Mocha CMA about a authorization for patient to have a MRI Brain with and without. She needs to talk to someone before 4 today or else it will be reschedule.

## 2017-10-27 NOTE — Telephone Encounter (Signed)
LM for pt to call and schedule OV if still having issues.

## 2017-10-27 NOTE — Telephone Encounter (Signed)
Waiting on response from referral coordinator.

## 2017-10-28 ENCOUNTER — Encounter: Payer: Self-pay | Admitting: Neurology

## 2017-10-28 ENCOUNTER — Other Ambulatory Visit: Payer: Self-pay

## 2017-10-28 DIAGNOSIS — R4781 Slurred speech: Secondary | ICD-10-CM

## 2017-10-29 ENCOUNTER — Ambulatory Visit: Payer: Self-pay | Admitting: *Deleted

## 2017-10-29 ENCOUNTER — Encounter (HOSPITAL_COMMUNITY): Payer: Self-pay

## 2017-10-29 ENCOUNTER — Emergency Department (EMERGENCY_DEPARTMENT_HOSPITAL)
Admission: EM | Admit: 2017-10-29 | Discharge: 2017-10-30 | Disposition: A | Discharge status: Home / Self Care | Payer: Medicare Other | Source: Home / Self Care | Attending: Emergency Medicine | Admitting: Emergency Medicine

## 2017-10-29 ENCOUNTER — Other Ambulatory Visit: Payer: Self-pay

## 2017-10-29 DIAGNOSIS — Z79899 Other long term (current) drug therapy: Secondary | ICD-10-CM | POA: Insufficient documentation

## 2017-10-29 DIAGNOSIS — F17290 Nicotine dependence, other tobacco product, uncomplicated: Secondary | ICD-10-CM | POA: Insufficient documentation

## 2017-10-29 DIAGNOSIS — Z85828 Personal history of other malignant neoplasm of skin: Secondary | ICD-10-CM

## 2017-10-29 DIAGNOSIS — G7000 Myasthenia gravis without (acute) exacerbation: Secondary | ICD-10-CM | POA: Insufficient documentation

## 2017-10-29 DIAGNOSIS — R4781 Slurred speech: Secondary | ICD-10-CM | POA: Diagnosis not present

## 2017-10-29 DIAGNOSIS — R1310 Dysphagia, unspecified: Secondary | ICD-10-CM | POA: Diagnosis not present

## 2017-10-29 LAB — CBC WITH DIFFERENTIAL/PLATELET
BASOS ABS: 0 10*3/uL (ref 0.0–0.1)
Basophils Relative: 0 %
Eosinophils Absolute: 0.2 10*3/uL (ref 0.0–0.7)
Eosinophils Relative: 3 %
HEMATOCRIT: 42.9 % (ref 39.0–52.0)
HEMOGLOBIN: 14.6 g/dL (ref 13.0–17.0)
LYMPHS PCT: 54 %
Lymphs Abs: 4.8 10*3/uL — ABNORMAL HIGH (ref 0.7–4.0)
MCH: 31.6 pg (ref 26.0–34.0)
MCHC: 34 g/dL (ref 30.0–36.0)
MCV: 92.9 fL (ref 78.0–100.0)
MONO ABS: 0.4 10*3/uL (ref 0.1–1.0)
Monocytes Relative: 4 %
NEUTROS ABS: 3.4 10*3/uL (ref 1.7–7.7)
Neutrophils Relative %: 39 %
Platelets: 178 10*3/uL (ref 150–400)
RBC: 4.62 MIL/uL (ref 4.22–5.81)
RDW: 12.2 % (ref 11.5–15.5)
WBC: 8.9 10*3/uL (ref 4.0–10.5)

## 2017-10-29 LAB — COMPREHENSIVE METABOLIC PANEL
ALT: 98 U/L — AB (ref 17–63)
AST: 67 U/L — AB (ref 15–41)
Albumin: 4.2 g/dL (ref 3.5–5.0)
Alkaline Phosphatase: 61 U/L (ref 38–126)
Anion gap: 11 (ref 5–15)
BILIRUBIN TOTAL: 0.9 mg/dL (ref 0.3–1.2)
BUN: 17 mg/dL (ref 6–20)
CO2: 24 mmol/L (ref 22–32)
CREATININE: 1.01 mg/dL (ref 0.61–1.24)
Calcium: 9.2 mg/dL (ref 8.9–10.3)
Chloride: 105 mmol/L (ref 101–111)
GFR calc Af Amer: >60 mL/min (ref >60)
Glucose, Bld: 147 mg/dL — ABNORMAL HIGH (ref 65–99)
Potassium: 4.4 mmol/L (ref 3.5–5.1)
Sodium: 140 mmol/L (ref 135–145)
TOTAL PROTEIN: 6.8 g/dL (ref 6.5–8.1)

## 2017-10-29 MED ORDER — PYRIDOSTIGMINE BROMIDE 60 MG PO TABS: 30.0000 mg | ORAL_TABLET | Freq: Three times a day (TID) | ORAL | 0 refills | Status: DC

## 2017-10-29 MED ORDER — SODIUM CHLORIDE 0.9 % IV BOLUS (SEPSIS): 1000.0000 mL | Freq: Once | INTRAVENOUS | Status: DC

## 2017-10-29 NOTE — ED Notes (Signed)
Spoke to EDP, pt has had prior imaging and just needs basic labs for now.

## 2017-10-29 NOTE — Telephone Encounter (Signed)
Pt reports difficulty swallowing fluids and solid food. Was seen in ED 10/26/17 for slurred speech, MRI W/O contrast done. Neurology appt set for 11/26/17. Reports onset of dysphagia began yesterday. "Feels like tongue swollen" Daughter present, reports tongue is not swollen, does not deviate. Denies any one-sided weakness, no numbness or tingling, no facial droop,denies nausea or visual changes. No new medications, change in diet. Pt's speech very slurred during call. Pt states has been able to drink some sips of fluids "very slowly." Directed to ED with new onset of symptoms. Instructed not to eat or drink anything until evaluated. Daughter will drive pt to ED now.  Reason for Disposition . [1] Severe difficulty swallowing (e.g., drooling or spitting, can't swallow water) AND [2] new onset AND [3] normal breathing  Answer Assessment - Initial Assessment Questions 1. SYMPTOM: "Are you having difficulty swallowing liquids, solids, or both?"    Both 2. ONSET: "When did the swallowing problems begin?"      Yesterday 3. CAUSE: "What do you think is causing the problem?"      Not sure 4. CHRONIC/RECURRENT: "Is this a new problem for you?"  If no, ask: "How long have you had this problem?" (e.g., days, weeks, months)     Seen in ED 10/26/17 for slurred speech, dysphasia new onset 5. OTHER SYMPTOMS: "Do you have any other symptoms?" (e.g., difficulty breathing, sore throat, swollen tongue, chest pain)     Slurred speech "feels like tongue swollen". Daughter present, states does not look swollen  Protocols used: SWALLOWING DIFFICULTY-A-AH

## 2017-10-29 NOTE — Telephone Encounter (Signed)
Pt has arrived at [facility] ED.  

## 2017-10-29 NOTE — Progress Notes (Signed)
Pt. Performed >-40 on the NIF and 2.6L on the vital capacity.

## 2017-10-29 NOTE — ED Notes (Signed)
Lab work, radiology results and vital signs reviewed, no critical results at this time, no change in acuity indicated.  

## 2017-10-29 NOTE — ED Triage Notes (Signed)
Pt states he has had slurred speech X1 week. Saw his PCP and had an MRI. States he is now having trouble swallowing. Pt is alert and oriented. No unilateral weakness noted.

## 2017-10-29 NOTE — Telephone Encounter (Signed)
Will monitor for ED arrival.  

## 2017-10-29 NOTE — Discharge Instructions (Addendum)
1.  You have an order placed for a swallowing study.  Call radiology in the morning to confirm a time. 2.  Per Dr. Cecil Cobbs instructions, you are not to start your pyridiostigmine until after you have completed your swallowing study. 3.  Return to the emergency department immediately if your symptoms are worsening.

## 2017-10-29 NOTE — ED Provider Notes (Signed)
[place] EMERGENCY DEPARTMENT Provider Note   CSN: [id number] Arrival date & time: 10/29/17  1628     History   Chief Complaint Chief Complaint  Patient presents with  . Aphasia    HPI Rodney Martinez is a 67 y.o. male.  HPI Started developing speech dysfunction 1 week ago.  First speech was just slightly slurred and he had some difficulty pronouncing words.  There were no other associated symptoms.  This is however progressed to increasing difficulty with slurred speech due to movement of the tongue.  Patient reports as of the past 2 days now he has started to notice that it is more difficult to swallow.  Yesterday he had a lot of difficulty swallowing liquids and food.  He felt that he could not move the food to the back of his mouth with his tongue.  Today he has now avoided eating and drinking because of the symptoms.  There have been no associated ocular symptoms.  No blurred vision or double vision.  No associated motor dysfunction or gait dysfunction.  No associated throat pain fever or signs of infectious illness.  Patient reports he does smoke cigars.  He is a Animal nutritionist.  He has diet-controlled diabetes.  His physician got an MRI which did not show any stroke findings but symptoms have progressed to difficulty swallowing now thus he proceeded to the emergency department for evaluation. Past Medical History:  Diagnosis Date  . GERD 12/12/2009  . HYPERGLYCEMIA 12/12/2009  . OBSTRUCTIVE SLEEP APNEA 08/14/2010  . TRANSAMINASES, SERUM, ELEVATED 12/12/2009    Patient Active Problem List   Diagnosis Date Noted  . Slurred speech 10/27/2017  . OBSTRUCTIVE SLEEP APNEA 08/14/2010  . GERD 12/12/2009  . HYPERGLYCEMIA 12/12/2009  . TRANSAMINASES, SERUM, ELEVATED 12/12/2009    Past Surgical History:  Procedure Laterality Date  . BASAL CELL CARCINOMA EXCISION  2007   face (Mohs)  . HERNIA REPAIR  0865   umbilical       Home Medications    Prior to  Admission medications   Medication Sig Start Date End Date Taking? Authorizing Provider  glucosamine-chondroitin 500-400 MG tablet Take 1 tablet by mouth daily.   Yes [provider]  levothyroxine (SYNTHROID, LEVOTHROID) 25 MCG tablet TAKE 1 TABLET (25 MCG TOTAL) BY MOUTH DAILY BEFORE BREAKFAST. 05/01/17  Yes Burchette, Alinda Sierras, MD  Melatonin 5 MG CAPS Take 5 mg by mouth at bedtime as needed (for sleep).    Yes [provider]  glucose blood test strip 1 each by Other route as needed. Reported on 04/07/2016    [provider]  Lancets (FREESTYLE) lancets 1 each by Other route as needed. Reported on 04/07/2016    [provider]  pyridostigmine (MESTINON) 60 MG tablet Take 0.5 tablets (30 mg total) by mouth 3 (three) times daily. 10/29/17   Charlesetta Shanks, MD    Family History Family History  Problem Relation Age of Onset  . Diabetes Mother        type ll  . Heart disease Father        ?atrial fibrillation  . Diabetes Maternal Grandfather   . Diabetes Paternal Grandmother   . Diabetes Paternal Grandfather   . Colon cancer Neg Hx   . Stomach cancer Neg Hx     Social History Social History   Tobacco Use  . Smoking status: Current Every Day Smoker    Years: 10.00    Types: Cigars  . Smokeless tobacco: Never  Used  Substance Use Topics  . Alcohol use: Yes    Comment: rare  . Drug use: No     Allergies   Patient has no known allergies.   Review of Systems Review of Systems 10 Systems reviewed and are negative for acute change except as noted in the HPI.   Physical Exam Updated Vital Signs BP 130/75 (BP Location: Left Arm)   Pulse (!) 59   Temp 97.8 F (36.6 C)   Resp 15   SpO2 96%   Physical Exam  Constitutional: He is oriented to person, place, and time.  Patient is alert and nontoxic.  No respiratory distress.  HENT:  Oral cavity is patent.  Patient does not have pooling of secretions.  No swelling of the tongue or uvula.  Eyes:  EOM are normal.  Neck:  No palpable neck masses.  Patient does have a fairly thick neck but I do not appreciate specific lymphadenopathy thyromegaly or masses  Cardiovascular: Normal rate, regular rhythm, normal heart sounds and intact distal pulses.  Pulmonary/Chest: Effort normal and breath sounds normal.  Abdominal: Soft. He exhibits no distension. There is no tenderness.  Musculoskeletal: Normal range of motion. He exhibits no edema or tenderness.  Neurological: He is alert and oriented to person, place, and time. No sensory deficit. He exhibits normal muscle tone. Coordination normal.  Patient speech is slightly slurred with normal content and speech generation no a aphasia.  Normal extraocular motions.  Symmetric grimace.  5\5 motor strength x4 extremities.  Sensation intact to light touch.  Skin: Skin is warm and dry.  Psychiatric: He has a normal mood and affect.     ED Treatments / Results  Labs (all labs ordered are listed, but only abnormal results are displayed) Labs Reviewed  CBC WITH DIFFERENTIAL/PLATELET - Abnormal; Notable for the following components:      Result Value   Lymphs Abs 4.8 (*)    All other components within normal limits  COMPREHENSIVE METABOLIC PANEL - Abnormal; Notable for the following components:   Glucose, Bld 147 (*)    AST 67 (*)    ALT 98 (*)    All other components within normal limits  ACETYLCHOLINE RECEPTOR AB, ALL    EKG  EKG Interpretation None       Radiology No results found.  Procedures Procedures (including critical care time)  Medications Ordered in ED Medications  sodium chloride 0.9 % bolus 1,000 mL (1,000 mLs Intravenous Refused 10/29/17 2330)     Initial Impression / Assessment and Plan / ED Course  I have reviewed the triage vital signs and the nursing notes.  Pertinent labs & imaging results that were available during my care of the patient were reviewed by me and considered in my medical decision making (see  chart for details).  Clinical Course as of Oct 29 2342  Thu Oct 29, 2017  2215 Consult: Dr. Kathrynn Speed will see the patient in the ED  [MP]    Clinical Course User Index [MP] Charlesetta Shanks, MD     Final Clinical Impressions(s) / ED Diagnoses   Final diagnoses:  Myasthenia gravis [facility])   Patient presents with symptoms consistent myasthenia gravis.  He has had consultation with neurology.  Recommendation was for inpatient treatment and completion of diagnostic evaluation.  Patient however is alert and appropriate.  He has oral symptoms with dysphasia.  He however is not having any difficulty breathing.  Patient is very committed to completing his workup and treatment  as an outpatient.  He has been counseled on necessity to return immediately should symptoms worsen or change. ED Discharge Orders        Ordered    DG Swallowing [facility] Pathology     10/29/17 2255    SLP modified barium swallow     10/29/17 2255    SLP swallowing-dysphagia (IP)     10/29/17 2256    pyridostigmine (MESTINON) 60 MG tablet  3 times daily     10/29/17 2257       Charlesetta Shanks, MD 10/29/17 2344

## 2017-10-30 ENCOUNTER — Other Ambulatory Visit: Payer: Self-pay

## 2017-10-30 ENCOUNTER — Emergency Department (HOSPITAL_COMMUNITY): Payer: Medicare Other

## 2017-10-30 ENCOUNTER — Emergency Department (HOSPITAL_COMMUNITY)
Admission: EM | Admit: 2017-10-30 | Discharge: 2017-10-30 | Disposition: A | Discharge status: Home / Self Care | Payer: Medicare Other | Attending: Emergency Medicine | Admitting: Emergency Medicine

## 2017-10-30 ENCOUNTER — Encounter (HOSPITAL_COMMUNITY): Payer: Self-pay

## 2017-10-30 DIAGNOSIS — R1310 Dysphagia, unspecified: Secondary | ICD-10-CM | POA: Diagnosis not present

## 2017-10-30 DIAGNOSIS — F17290 Nicotine dependence, other tobacco product, uncomplicated: Secondary | ICD-10-CM | POA: Insufficient documentation

## 2017-10-30 DIAGNOSIS — Z79899 Other long term (current) drug therapy: Secondary | ICD-10-CM | POA: Insufficient documentation

## 2017-10-30 LAB — CBC WITH DIFFERENTIAL/PLATELET
BASOS ABS: 0 10*3/uL (ref 0.0–0.1)
Basophils Relative: 0 %
EOS ABS: 0.2 10*3/uL (ref 0.0–0.7)
Eosinophils Relative: 2 %
HEMATOCRIT: 44.8 % (ref 39.0–52.0)
HEMOGLOBIN: 15.3 g/dL (ref 13.0–17.0)
Lymphocytes Relative: 55 %
Lymphs Abs: 4.9 10*3/uL — ABNORMAL HIGH (ref 0.7–4.0)
MCH: 31.9 pg (ref 26.0–34.0)
MCHC: 34.2 g/dL (ref 30.0–36.0)
MCV: 93.3 fL (ref 78.0–100.0)
Monocytes Absolute: 0.3 10*3/uL (ref 0.1–1.0)
Monocytes Relative: 4 %
NEUTROS ABS: 3.4 10*3/uL (ref 1.7–7.7)
NEUTROS PCT: 39 %
Platelets: 182 10*3/uL (ref 150–400)
RBC: 4.8 MIL/uL (ref 4.22–5.81)
RDW: 12.4 % (ref 11.5–15.5)
WBC: 8.9 10*3/uL (ref 4.0–10.5)

## 2017-10-30 LAB — COMPREHENSIVE METABOLIC PANEL
ALBUMIN: 4.5 g/dL (ref 3.5–5.0)
ALK PHOS: 57 U/L (ref 38–126)
ALT: 105 U/L — ABNORMAL HIGH (ref 17–63)
ANION GAP: 13 (ref 5–15)
AST: 70 U/L — AB (ref 15–41)
BILIRUBIN TOTAL: 1 mg/dL (ref 0.3–1.2)
BUN: 20 mg/dL (ref 6–20)
CALCIUM: 9.2 mg/dL (ref 8.9–10.3)
CO2: 20 mmol/L — AB (ref 22–32)
Chloride: 104 mmol/L (ref 101–111)
Creatinine, Ser: 0.95 mg/dL (ref 0.61–1.24)
GFR calc Af Amer: >60 mL/min (ref >60)
GFR calc non Af Amer: >60 mL/min (ref >60)
GLUCOSE: 180 mg/dL — AB (ref 65–99)
POTASSIUM: 4.6 mmol/L (ref 3.5–5.1)
SODIUM: 137 mmol/L (ref 135–145)
TOTAL PROTEIN: 6.9 g/dL (ref 6.5–8.1)

## 2017-10-30 NOTE — ED Notes (Signed)
PT states understanding of care given, follow up care, and medication prescribed. PT ambulated from ED to car with a steady gait. 

## 2017-10-30 NOTE — Discharge Instructions (Signed)
Please continue taking Mestinon as prescribed.  Follow up with your doctor or with neurologist for further evaluation of your difficulty swallowing.  You may continue to eat and drink as tolerated.  Return if your condition worsen or if you have other concerns.

## 2017-10-30 NOTE — ED Provider Notes (Signed)
[place] EMERGENCY DEPARTMENT Provider Note   CSN: [id number] Arrival date & time: 10/30/17  1139     History   Chief Complaint No chief complaint on file.   HPI AEDAN Martinez is a 67 y.o. male.  HPI   67 year old male with history of obstructive sleep apnea, GERD, and a recent diagnosis of myasthenia gravis presenting for admission for further workup.  Patient was seen last night in the ED for difficulty swallowing.  He developed dysphasia for approximately 1 week.  It started with slightly slurred speech and now he is having trouble pronoucning words.  He noticed increasing difficulty moving his tongue, and now having trouble swallowing both liquid and solid.  No report of vision changes, denies any active pain, no chest pain or trouble breathing.  Neurologist, Dr. Leonel Ramsay did evaluate patient during yesterday's visit.  He felt that his symptoms likely myasthenia gravis.  He recommend admission to obtain swallow evaluation.  Patient initially declined admission and prefers to have his evaluation outpatient.  At home he finds out the evaluation will be in 5 days.  Patient therefore return for further management.  He did filled his prescription Mestinon 30mg  TID and took one dose yesterday.  He is currently without complaint.    Past Medical History:  Diagnosis Date  . GERD 12/12/2009  . HYPERGLYCEMIA 12/12/2009  . OBSTRUCTIVE SLEEP APNEA 08/14/2010  . TRANSAMINASES, SERUM, ELEVATED 12/12/2009    Patient Active Problem List   Diagnosis Date Noted  . Slurred speech 10/27/2017  . OBSTRUCTIVE SLEEP APNEA 08/14/2010  . GERD 12/12/2009  . HYPERGLYCEMIA 12/12/2009  . TRANSAMINASES, SERUM, ELEVATED 12/12/2009    Past Surgical History:  Procedure Laterality Date  . BASAL CELL CARCINOMA EXCISION  2007   face (Mohs)  . HERNIA REPAIR  3220   umbilical       Home Medications    Prior to Admission medications   Medication Sig Start Date End Date Taking?  Authorizing Provider  glucosamine-chondroitin 500-400 MG tablet Take 1 tablet by mouth daily.    [provider]  glucose blood test strip 1 each by Other route as needed. Reported on 04/07/2016    [provider]  Lancets (FREESTYLE) lancets 1 each by Other route as needed. Reported on 04/07/2016    [provider]  levothyroxine (SYNTHROID, LEVOTHROID) 25 MCG tablet TAKE 1 TABLET (25 MCG TOTAL) BY MOUTH DAILY BEFORE BREAKFAST. 05/01/17   Burchette, Alinda Sierras, MD  Melatonin 5 MG CAPS Take 5 mg by mouth at bedtime as needed (for sleep).     [provider]  pyridostigmine (MESTINON) 60 MG tablet Take 0.5 tablets (30 mg total) by mouth 3 (three) times daily. 10/29/17   Charlesetta Shanks, MD    Family History Family History  Problem Relation Age of Onset  . Diabetes Mother        type ll  . Heart disease Father        ?atrial fibrillation  . Diabetes Maternal Grandfather   . Diabetes Paternal Grandmother   . Diabetes Paternal Grandfather   . Colon cancer Neg Hx   . Stomach cancer Neg Hx     Social History Social History   Tobacco Use  . Smoking status: Current Every Day Smoker    Years: 10.00    Types: Cigars  . Smokeless tobacco: Never Used  Substance Use Topics  . Alcohol use: Yes    Comment: rare  . Drug use: No  Allergies   Patient has no known allergies.   Review of Systems Review of Systems  All other systems reviewed and are negative.    Physical Exam Updated Vital Signs BP 131/85 (BP Location: Right Arm)   Pulse 64   Temp 97.6 F (36.4 C) (Oral)   Resp 16   SpO2 98%   Physical Exam  Constitutional: He is oriented to person, place, and time. He appears well-developed and well-nourished. No distress.  HENT:  Head: Atraumatic.  Mouth/Throat: Oropharynx is clear and moist.  Eyes: Conjunctivae are normal.  Neck: Neck supple.  Cardiovascular: Normal rate and regular rhythm.  Pulmonary/Chest: Effort normal and breath sounds  normal.  Abdominal: Soft. He exhibits no distension. There is no tenderness.  Neurological: He is alert and oriented to person, place, and time. He has normal strength. No cranial nerve deficit or sensory deficit. GCS eye subscore is 4. GCS verbal subscore is 5. GCS motor subscore is 6.  Speech is slightly dysarthric.  Tongue with normal movement.  Moving all 4 extremities with equal strength.    Skin: No rash noted.  Psychiatric: He has a normal mood and affect.  Nursing note and vitals reviewed.    ED Treatments / Results  Labs (all labs ordered are listed, but only abnormal results are displayed) Labs Reviewed  CBC WITH DIFFERENTIAL/PLATELET - Abnormal; Notable for the following components:      Result Value   Lymphs Abs 4.9 (*)    All other components within normal limits  COMPREHENSIVE METABOLIC PANEL - Abnormal; Notable for the following components:   CO2 20 (*)    Glucose, Bld 180 (*)    AST 70 (*)    ALT 105 (*)    All other components within normal limits    EKG  EKG Interpretation None       Radiology Dg Swallowing Func-speech Pathology  Result Date: 10/30/2017 Objective Swallowing Evaluation: Type of Study: MBS-Modified Barium Swallow Study  Patient Details Name: Rodney Martinez MRN: [id number] Date of Birth: 1950/11/27 Today's Date: 10/30/2017 Time: SLP Start Time (ACUTE ONLY): 1430 -SLP Stop Time (ACUTE ONLY): 1455 SLP Time Calculation (min) (ACUTE ONLY): 25 min Past Medical History: Past Medical History: Diagnosis Date . GERD 12/12/2009 . HYPERGLYCEMIA 12/12/2009 . OBSTRUCTIVE SLEEP APNEA 08/14/2010 . TRANSAMINASES, SERUM, ELEVATED 12/12/2009 Past Surgical History: Past Surgical History: Procedure Laterality Date . BASAL CELL CARCINOMA EXCISION  2007  face (Mohs) . HERNIA REPAIR  4098  umbilical HPI: Rodney Martinez a 67 y.o.malewho presents with a week long history of dysarthria and dysphagia. They first started noticing him slurring his speech about a week ago. He  sought care for this with his PCP ordered an MRI.This was performed on 1/22 long after symptoms that started and was negative. He continued having difficulties which wax and wane according to both him and his wife. He noticed that he was having difficulty with swallowing therefore presented to the emergency department on 1/24 at night. Dr Leonel Ramsay assessed  him and flet finding were consistent with a bulbar dysfunction, most likely Myasthenia Gravis given the absence of any peripheral weakness. Her prescribed Mestinon 3x a day. Pt was advised to await MBS in am, but did not want to stay the night. He was then instructed to stay NPO and schedule an OP MBS, but this was not available until next week, so he returned to the ED and SLP was call to complete MBS acutely. Pt reports struggle to form and transit solids  and eventually anterior spillage of liquids and poor ability to trigger a swallow up until today. He subjectively reports improvement in speech since Mestinon was started, though he is overtly dysarthric at time of assessment. Respiratory therapist noted greater than -40 NIF and 2.6L on vital capacity last night.  No Data Recorded Assessment / Plan / Recommendation CHL IP CLINICAL IMPRESSIONS 10/30/2017 Clinical Impression Pt demosntrates swallow function impaired by fatigue of the base of tongue and upper pharyngeal constrictor, consistent with mild bulbar dysfunction. Pt presented with dysarthria, worsening throughout study with observable distortion of bilabial, palatal and velar phonemes. Pt initially presented with normal oral and ororpharyngeal function with no residuals, timely mastication and transit. Challenged pt to consume 4 oz of peaches and a whole nutrigrain bar without fluoroscopy. After this, pt more significantly dysarthric with prolonged mastication, laborious bolus formation and base of tongue residual. Base of tongue contact with posterior pharyngeal wall reduced. Despite this, even  after fatigue, dysphagia was only mild in severity. Counseled pt to consume soft PO after mestinon dose, when he is feeling strong and to stop eating and drinking if fatigue sets in. Also outlined signs of decreased airway protection such as throat clearing with secretions, wet vocal quality, weak cough or an inability to expectorate a sensation of mucous. No SLP f/u needed unless pt fails to improve or worsens despite medical management. Pt in agreement with plan.  SLP Visit Diagnosis Dysphagia, oropharyngeal phase (R13.12) Attention and concentration deficit following -- Frontal lobe and executive function deficit following -- Impact on safety and function --   CHL IP TREATMENT RECOMMENDATION 10/30/2017 Treatment Recommendations No treatment recommended at this time   No flowsheet data found. CHL IP DIET RECOMMENDATION 10/30/2017 SLP Diet Recommendations Dysphagia 3 (Mech soft) solids;Thin liquid Liquid Administration via Cup;Straw Medication Administration Whole meds with liquid Compensations Slow rate;Small sips/bites Postural Changes Seated upright at 90 degrees   CHL IP OTHER RECOMMENDATIONS 10/30/2017 Recommended Consults -- Oral Care Recommendations Patient independent with oral care Other Recommendations --   No flowsheet data found.  No flowsheet data found.     CHL IP ORAL PHASE 10/30/2017 Oral Phase Impaired Oral - Pudding Teaspoon -- Oral - Pudding Cup -- Oral - Honey Teaspoon -- Oral - Honey Cup -- Oral - Nectar Teaspoon -- Oral - Nectar Cup -- Oral - Nectar Straw -- Oral - Thin Teaspoon -- Oral - Thin Cup WFL Oral - Thin Straw WFL Oral - Puree WFL Oral - Mech Soft -- Oral - Regular Impaired mastication;Weak lingual manipulation;Decreased bolus cohesion;Lingual/palatal residue Oral - Multi-Consistency -- Oral - Pill -- Oral Phase - Comment --  CHL IP PHARYNGEAL PHASE 10/30/2017 Pharyngeal Phase Impaired Pharyngeal- Pudding Teaspoon -- Pharyngeal -- Pharyngeal- Pudding Cup -- Pharyngeal -- Pharyngeal- Honey  Teaspoon -- Pharyngeal -- Pharyngeal- Honey Cup -- Pharyngeal -- Pharyngeal- Nectar Teaspoon -- Pharyngeal -- Pharyngeal- Nectar Cup -- Pharyngeal -- Pharyngeal- Nectar Straw -- Pharyngeal -- Pharyngeal- Thin Teaspoon -- Pharyngeal -- Pharyngeal- Thin Cup WFL Pharyngeal -- Pharyngeal- Thin Straw WFL Pharyngeal -- Pharyngeal- Puree WFL Pharyngeal -- Pharyngeal- Mechanical Soft -- Pharyngeal -- Pharyngeal- Regular Pharyngeal residue - valleculae;Reduced pharyngeal peristalsis Pharyngeal -- Pharyngeal- Multi-consistency WFL Pharyngeal -- Pharyngeal- Pill -- Pharyngeal -- Pharyngeal Comment --  No flowsheet data found. No flowsheet data found. DeBlois, Katherene Ponto 10/30/2017, 3:21 PM               Procedures Procedures (including critical care time)  Medications Ordered in ED Medications - No data to  display   Initial Impression / Assessment and Plan / ED Course  I have reviewed the triage vital signs and the nursing notes.  Pertinent labs & imaging results that were available during my care of the patient were reviewed by me and considered in my medical decision making (see chart for details).     BP 131/85 (BP Location: Right Arm)   Pulse 64   Temp 97.6 F (36.4 C) (Oral)   Resp 16   SpO2 98%    Final Clinical Impressions(s) / ED Diagnoses   Final diagnoses:  Dysphagia    ED Discharge Orders    None     1:11 PM Patient here with progressive bulbar dysfunction which is suspected to be neuromuscular etiology likely myasthenia gravis.  Patient has been evaluated by neurology who recommend swallow evaluation, n.p.o. pending swallowing study, trial of Mestinon, checking acetylcholine receptor antibodies, and eventually get an EMG/nerve conduction study outpatient.  Plan to consult neurology to have further recommendation.  Pt likely will need swallow study.    1:44 PM Appreciate consultation with on call neurologist Dr. Rory Percy who agrees pt should get swallow study by speech  pathology.    3:16 PM Patient did receive his functional speech pathology swallowing screen study.  He performed very well only mild fatigue after prolonged chewing.  The speech pathologist felt that patient is stable to go home to eat and drink as per usual.  He will continue to take his Mestinon and will continue with outpatient evaluation of his condition.   Domenic Moras, PA-C 10/30/17 1527    Drenda Freeze, MD 10/30/17 [phone]

## 2017-10-30 NOTE — Progress Notes (Signed)
Modified Barium Swallow Progress Note  Patient Details  Name: Rodney Martinez MRN: [id number] Date of Birth: June 30, 1951  Today's Date: 10/30/2017  Modified Barium Swallow completed.  Full report located under Chart Review in the Imaging Section.  Brief recommendations include the following:  Clinical Impression  Pt demosntrates swallow function impaired by fatigue of the base of tongue and upper pharyngeal constrictor, consistent with mild bulbar dysfunction. Pt presented with dysarthria, worsening throughout study with observable distortion of bilabial, palatal and velar phonemes. Pt initially presented with normal oral and ororpharyngeal function with no residuals, timely mastication and transit. Challenged pt to consume 4 oz of peaches and a whole nutrigrain bar without fluoroscopy. After this, pt more significantly dysarthric with prolonged mastication, laborious bolus formation and base of tongue residual. Base of tongue contact with posterior pharyngeal wall reduced. Despite this, even after fatigue, dysphagia was only mild in severity. Counseled pt to consume soft PO after mestinon dose, when he is feeling strong and to stop eating and drinking if fatigue sets in. Also outlined signs of decreased airway protection such as throat clearing with secretions, wet vocal quality, weak cough or an inability to expectorate a sensation of mucous. No SLP f/u needed unless pt fails to improve or worsens despite medical management. Pt in agreement with plan.    Swallow Evaluation Recommendations       SLP Diet Recommendations: Dysphagia 3 (Mech soft) solids;Thin liquid   Liquid Administration via: Cup;Straw   Medication Administration: Whole meds with liquid   Supervision: Patient able to self feed   Compensations: Slow rate;Small sips/bites   Postural Changes: Seated upright at 90 degrees   Oral Care Recommendations: Patient independent with oral care        Tanyiah Laurich, Katherene Ponto 10/30/2017,3:14 PM

## 2017-10-30 NOTE — Consult Note (Signed)
Neurology Consultation Reason for Consult: Dysphagia Referring Physician: Johnney Killian, M  CC: Dysphagia  History is obtained from: Patient  HPI: Rodney Martinez is a 67 y.o. male who presents with a week long history of dysarthria and dysphagia.  They first started noticing him slurring his speech about a week ago.  He sought care for this with his PCP ordered an MRI.  This was performed on 1/22 long after symptoms that started and was negative.  He continued having difficulties which wax and wane according to both him and his wife.  He noticed that he was having difficulty with swallowing therefore presented to the emergency department tonight.  He may have had some fatigue, but states that he is not certain if this is just due to getting older, nothing really different over the last week.  No difficulty with going up stairs.  He denies any other symptoms, denies weakness, numbness, visual change, diplopia, droopy eyelid.  ROS: A 14 point ROS was performed and is negative except as noted in the HPI.   Past Medical History:  Diagnosis Date  . GERD 12/12/2009  . HYPERGLYCEMIA 12/12/2009  . OBSTRUCTIVE SLEEP APNEA 08/14/2010  . TRANSAMINASES, SERUM, ELEVATED 12/12/2009     Family History  Problem Relation Age of Onset  . Diabetes Mother        type ll  . Heart disease Father        ?atrial fibrillation  . Diabetes Maternal Grandfather   . Diabetes Paternal Grandmother   . Diabetes Paternal Grandfather   . Colon cancer Neg Hx   . Stomach cancer Neg Hx      Social History:  reports that he has been smoking cigars.  He has smoked for the past 10.00 years. he has never used smokeless tobacco. He reports that he drinks alcohol. He reports that he does not use drugs.   Exam: Current vital signs: BP 130/75 (BP Location: Left Arm)   Pulse (!) 59   Temp 97.8 F (36.6 C)   Resp 15   SpO2 96%  Vital signs in last 24 hours: Temp:  [97.8 F (36.6 C)-97.9 F (36.6 C)] 97.8 F (36.6 C)  (01/24 2046) Pulse Rate:  [58-63] 59 (01/24 2342) Resp:  [15-18] 15 (01/24 2342) BP: (125-142)/(74-76) 130/75 (01/24 2342) SpO2:  [96 %-98 %] 96 % (01/24 2342)   Physical Exam  Constitutional: Appears well-developed and well-nourished.  Psych: Affect appropriate to situation Eyes: No scleral injection HENT: No OP obstrucion Head: Normocephalic.  Cardiovascular: Normal rate and regular rhythm.  Respiratory: Effort normal, non-labored breathing GI: Soft.  No distension. There is no tenderness.  Skin: WDI  Neuro: Mental Status: Patient is awake, alert, oriented to person, place, month, year, and situation. Patient is able to give a clear and coherent history. No signs of aphasia or neglect Cranial Nerves: II: Visual Fields are full. Pupils are equal, round, and reactive to light.   III,IV, VI: EOMI without ptosis or diploplia.  V: Facial sensation is symmetric to temperature VII: He has a transverse smile, I question whether there may be slightly more involvement of the left side than the right, but this is not definite. VIII: hearing is intact to voice X: Uvula elevates symmetrically XI: Shoulder shrug is symmetric. XII: tongue is midline without atrophy or fasciculations.  Motor: Tone is normal. Bulk is normal. 5/5 strength was present in all four extremities.  He has no pronator drift Sensory: Sensation is symmetric to light touch and temperature in  the arms and legs. Deep Tendon Reflexes: 2+ and symmetric in the biceps and patellae.  Cerebellar: FNF intact bilaterally Gait: Normal gait  I have reviewed labs in epic and the results pertinent to this consultation are: CMP-unremarkable other than mildly elevated LFTs  I have reviewed the images obtained: MRI brain-negative  Impression: 67 year old male with 1 week of progressive bulbar dysfunction.  I suspect neuromuscular etiology to the symptoms, no evidence of structural or infectious etiology.  I think that  myasthenia gravis is the most likely based on his presentation.  No evidence of hyperreflexia or other weakness to suggest ALS.  No evidence of the typical proximal muscle weakness that would be characteristic of a myositis and myositis typically causes bulbar dysfunction long after it causes peripheral weakness.  With the progressive nature as well as with normal imaging, I think that ischemic stroke is much less likely.  I advised him that I was concerned about his safety to swallow, and advised admission to obtain a swallowing evaluation.  He declined admission, and asked if we could arrange this to be done as an outpatient.  After discussion with ED physician, we did order a swallowing study to be done, but I advised him that I did not have any way of scheduling this today, and that admission would provide the most certain means of getting this done in a timely fashion.  I advised the going home would put him at an increased risk of choking, and he advised understanding.   If this were to fall through, then he may need admission to expedite swallowing evaluation to ensure he is safe.  Recommendations: 1) swallowing evaluation 2) I advised n.p.o. status pending his swallowing evaluation 3) following this evaluation, trial of Mestinon 30 mg 3 times daily 4) acetylcholine receptor antibodies 5) EMG/nerve conduction study which would need to be done as an outpatient   Roland Rack, MD [facility] [phone]  If 7pm- 7am, please page neurology on call as listed in [place].

## 2017-10-30 NOTE — ED Triage Notes (Signed)
Pt seen last night here in ED and seen by Dr. Leonel Ramsay who recommended admission for barium swallow study. PT declined admission at that time and swallow study was ordered for out pt to be done today. When pt called he was told he could be seen for swallow study until Tuesday.   Per Dr. Henderson Newcomer note he is to remain NPO until swallow study is completed and if that was not able to be done today pt would need admission for study.

## 2017-10-30 NOTE — ED Notes (Signed)
ED Provider at bedside. 

## 2017-11-02 ENCOUNTER — Encounter: Payer: Self-pay | Admitting: Family Medicine

## 2017-11-02 ENCOUNTER — Ambulatory Visit (INDEPENDENT_AMBULATORY_CARE_PROVIDER_SITE_OTHER): Payer: Medicare Other | Admitting: Family Medicine

## 2017-11-02 VITALS — BP 110/64 | HR 60 | Temp 97.6°F | Ht 71.0 in | Wt 267.9 lb

## 2017-11-02 DIAGNOSIS — Z0000 Encounter for general adult medical examination without abnormal findings: Secondary | ICD-10-CM | POA: Diagnosis not present

## 2017-11-02 DIAGNOSIS — R197 Diarrhea, unspecified: Secondary | ICD-10-CM

## 2017-11-02 DIAGNOSIS — Z23 Encounter for immunization: Secondary | ICD-10-CM

## 2017-11-02 DIAGNOSIS — D369 Benign neoplasm, unspecified site: Secondary | ICD-10-CM | POA: Insufficient documentation

## 2017-11-02 LAB — LIPID PANEL
CHOL/HDL RATIO: 4
Cholesterol: 145 mg/dL (ref 0–200)
HDL: 35.1 mg/dL — ABNORMAL LOW (ref >39.00)
LDL Cholesterol: 82 mg/dL (ref 0–99)
NonHDL: 109.84
TRIGLYCERIDES: 140 mg/dL (ref 0.0–149.0)
VLDL: 28 mg/dL (ref 0.0–40.0)

## 2017-11-02 LAB — HEMOGLOBIN A1C: HEMOGLOBIN A1C: 7.9 % — AB (ref 4.6–6.5)

## 2017-11-02 LAB — PSA: PSA: 0.33 ng/mL (ref 0.10–4.00)

## 2017-11-02 LAB — TSH: TSH: 3.75 u[IU]/mL (ref 0.35–4.50)

## 2017-11-02 NOTE — Patient Instructions (Signed)
Consider the following later this year after current medication condition stable: -low dose CT lung cancer screen -Abdominal aortic aneurysm screen -repeat Colonoscopy.

## 2017-11-02 NOTE — Progress Notes (Signed)
Subjective:     Patient ID: Rodney Martinez, male   DOB: 11-Apr-1951, 67 y.o.   MRN: [id number]  HPI Patient seen for physical exam. He presented here just last week with some dysarthria apparent also having some dysphagia. He was sent for MRI by colleague from this practice and that came back normal. He presented to ER with some progressive difficulty swallowing. Was seen by neurology and felt to have likely myasthenia gravis. No evidence for any peripheral weakness. Was started on low-dose Mestinon and has follow-up with neurology scheduled. Acetylcholine receptor antibodies pending. He had chemistries with CBC and comprehensive metabolic panel which were unremarkable.  Patient had swallowing study consistent with mild bulbar dysfunction. He is keeping down softened foods but is taking longer to eat, in general. He has not had any choking episodes.  Other medical problems include history of obesity, obstructive sleep apnea, GERD, history of mild elevated liver transaminases, and hyperglycemia. Has not had any recent follow-up for diabetes.  Overdue for repeat colonoscopy. He had tubular adenomas on colonoscopy 2013 .  Also has not had flu vaccine or Prevnar 13. No history of abdominal aortic aneurysm screening. No history of CT lung cancer screening. He does have about a 30 pack year history of smoking.  He's had some chronic loose stools and wonders whether he may have lactose intolerance or possibly celiac disease. There some question whether his father may have lactose intolerance issues. He states his father had chronic loose stools. He's never been tested for celiac disease. No history of anemia.  No alcohol use.  Past Medical History:  Diagnosis Date  . GERD 12/12/2009  . HYPERGLYCEMIA 12/12/2009  . OBSTRUCTIVE SLEEP APNEA 08/14/2010  . TRANSAMINASES, SERUM, ELEVATED 12/12/2009   Past Surgical History:  Procedure Laterality Date  . BASAL CELL CARCINOMA EXCISION  2007   face (Mohs)  . HERNIA  REPAIR  [id number]   umbilical    reports that he has been smoking cigars.  He has smoked for the past 10.00 years. he has never used smokeless tobacco. He reports that he drinks alcohol. He reports that he does not use drugs. family history includes Diabetes in his maternal grandfather, mother, paternal grandfather, and paternal grandmother; Heart disease in his father. No Known Allergies   Review of Systems  Constitutional: Negative for fatigue and unexpected weight change.  HENT: Positive for trouble swallowing and voice change. Negative for congestion.   Eyes: Negative for visual disturbance.  Respiratory: Negative for cough, chest tightness and shortness of breath.   Cardiovascular: Negative for chest pain, palpitations and leg swelling.  Gastrointestinal: Negative for abdominal pain, nausea and vomiting.  Endocrine: Negative for polydipsia and polyuria.  Genitourinary: Negative for dysuria.  Skin: Negative for rash.  Neurological: Negative for dizziness, syncope, weakness, light-headedness and headaches.  Psychiatric/Behavioral: Negative for confusion.       Objective:   Physical Exam  Constitutional: He is oriented to person, place, and time. He appears well-developed and well-nourished.  HENT:  Right Ear: External ear normal.  Left Ear: External ear normal.  Mouth/Throat: Oropharynx is clear and moist.  Eyes: Pupils are equal, round, and reactive to light.  Neck: Neck supple.  Cardiovascular: Normal rate and regular rhythm.  Pulmonary/Chest: Effort normal and breath sounds normal. No respiratory distress. He has no wheezes. He has no rales.  Musculoskeletal: He exhibits no edema.  Lymphadenopathy:    He has no cervical adenopathy.  Neurological: He is alert and oriented to person, place, and time.  No cranial nerve deficit.  Patient has clear slurring of speech but comprehensible  Psychiatric: He has a normal mood and affect. His behavior is normal.       Assessment:      #1 physical exam. The following preventative issues were discussed as below  #2 recent onset of dysarthria and dysphagia probably related to myasthenia gravis  #3 ongoing nicotine use with cigar smoking  #4 type 2 diabetes currently and treated with no recent assessment  #5 history of tubular adenomatous colon polyps  #6 history of frequent loose stools    Plan:     -Check lab work. Will not get CBC and comprehensive metabolic panel since these were done recently. Check A1c, hepatitis C antibody, TSH. -Patient requesting Celiac screen. Will check tissue transglutaminase antibodies -Patient overdue for repeat colonoscopy but he wishes to get his current situation stable with regard to myasthenia first -Discussed abdominal aortic aneurysm Medicare screen. He wishes to wait at this time -Discussed low-dose CT lung cancer screening. He will consider but defers at this time Flu vaccine and Prevnar 13 given  [name] MD [facility] -

## 2017-11-03 ENCOUNTER — Other Ambulatory Visit (HOSPITAL_COMMUNITY): Payer: Medicare Other

## 2017-11-03 ENCOUNTER — Ambulatory Visit (HOSPITAL_COMMUNITY): Payer: Medicare Other

## 2017-11-04 LAB — ACETYLCHOLINE RECEPTOR AB, ALL
ACETYLCHOLINE BINDING AB: 1.01 nmol/L — AB (ref 0.00–0.24)
ACETYLCHOLINE MODULAT AB: 25 % — AB (ref 0–20)
Acetylchol Block Ab: 61 % — ABNORMAL HIGH (ref 0–25)

## 2017-11-05 LAB — HEPATITIS C ANTIBODY
HEP C AB: NONREACTIVE
SIGNAL TO CUT-OFF: 0.01 (ref <1.00)

## 2017-11-05 LAB — TISSUE TRANSGLUTAMINASE ABS,IGG,IGA
(TTG) AB, IGA: 1 U/mL
(TTG) AB, IGG: 1 U/mL

## 2017-11-06 ENCOUNTER — Encounter: Payer: Self-pay | Admitting: Family Medicine

## 2017-11-09 ENCOUNTER — Other Ambulatory Visit: Payer: Self-pay | Admitting: *Deleted

## 2017-11-09 MED ORDER — PYRIDOSTIGMINE BROMIDE 60 MG PO TABS: 30.0000 mg | ORAL_TABLET | Freq: Three times a day (TID) | ORAL | 0 refills | Status: DC

## 2017-11-26 ENCOUNTER — Encounter: Payer: Self-pay | Admitting: Neurology

## 2017-11-26 ENCOUNTER — Ambulatory Visit: Payer: Medicare Other | Admitting: Neurology

## 2017-11-26 VITALS — BP 110/70 | HR 58 | Ht 71.0 in | Wt 250.4 lb

## 2017-11-26 DIAGNOSIS — G7000 Myasthenia gravis without (acute) exacerbation: Secondary | ICD-10-CM

## 2017-11-26 MED ORDER — PREDNISONE 10 MG PO TABS: 20.0000 mg | ORAL_TABLET | Freq: Every day | ORAL | 1 refills | Status: DC

## 2017-11-26 MED ORDER — PYRIDOSTIGMINE BROMIDE 60 MG PO TABS: 60.0000 mg | ORAL_TABLET | Freq: Three times a day (TID) | ORAL | 1 refills | Status: DC

## 2017-11-26 NOTE — Progress Notes (Signed)
NEUROLOGY CONSULTATION NOTE  Rodney Martinez MRN: [id number] DOB: [birth year]  Referring provider: Dr. Sherren Mocha Primary care provider: Dr. Elease Hashimoto  Reason for consult:  Myasthenia gravis.  HISTORY OF PRESENT ILLNESS: Rodney Martinez is a 68 year old right-handed male with OSA who presents for myasthenia gravis.  He is accompanied by his wife who supplements history.  History also supplemented by ED and referring provider's notes.  Last month, he started having trouble pronouncing words, which progressively got worse.  He had trouble moving his tongue.  MRI of the brain with and without contrast on 10/27/17 was personally reviewed and was unremarkable.  He then started having trouble swallowing both solids and liquids.  He went to the [facility] ED on 10/29/17 for further evaluation.  Acetylcholine antibodies were positive.  He was started on pyridostigmine 30mg  three times daily. He declined hospital admission for swallow evaluation.  He returned to the ED the following day for further management.  He had a modified barium swallow study which demonstrated mild bulbar dysfunction.    Dysarthria and dysphagia are improved in the morning or after period of rest.  However, the duration without muscle weakness has gotten shorter.  He reports difficulty putting in his contact lenses because he cannot open his eyes well.  While it takes longer to eat, he is able to tolerate soft foods.  He has lost 20 lbs since onset of symptoms.  He denies double vision, shortness of breath or generalized weakness of the extremities.  TSH from 11/02/17 was 3.75.     PAST MEDICAL HISTORY: Past Medical History:  Diagnosis Date  . GERD 12/12/2009  . HYPERGLYCEMIA 12/12/2009  . OBSTRUCTIVE SLEEP APNEA 08/14/2010  . TRANSAMINASES, SERUM, ELEVATED 12/12/2009    PAST SURGICAL HISTORY: Past Surgical History:  Procedure Laterality Date  . BASAL CELL CARCINOMA EXCISION  2007   face (Mohs)  . HERNIA REPAIR  [id number]   umbilical    MEDICATIONS: Current Outpatient Medications on File Prior to Visit  Medication Sig Dispense Refill  . glucosamine-chondroitin 500-400 MG tablet Take 1 tablet by mouth daily.    Marland Kitchen glucose blood test strip 1 each by Other route as needed. Reported on 04/07/2016    . Lancets (FREESTYLE) lancets 1 each by Other route as needed. Reported on 04/07/2016    . levothyroxine (SYNTHROID, LEVOTHROID) 25 MCG tablet TAKE 1 TABLET (25 MCG TOTAL) BY MOUTH DAILY BEFORE BREAKFAST. 90 tablet 1  . Melatonin 5 MG CAPS Take 5 mg by mouth at bedtime as needed (for sleep).      No current facility-administered medications on file prior to visit.     ALLERGIES: No Known Allergies  FAMILY HISTORY: Family History  Problem Relation Age of Onset  . Diabetes Mother        type ll  . Heart disease Father        ?atrial fibrillation  . Diabetes Maternal Grandfather   . Diabetes Paternal Grandmother   . Diabetes Paternal Grandfather   . Colon cancer Neg Hx   . Stomach cancer Neg Hx     SOCIAL HISTORY: Social History   Socioeconomic History  . Marital status: Married    Spouse name: Not on file  . Number of children: 1  . Years of education: Not on file  . Highest education level: Not on file  Social Needs  . Financial resource strain: Not on file  . Food insecurity - worry: Not on file  . Food insecurity -  inability: Not on file  . Transportation needs - medical: Not on file  . Transportation needs - non-medical: Not on file  Occupational History  . Occupation: retired Animal nutritionist  Tobacco Use  . Smoking status: Current Every Day Smoker    Years: 10.00    Types: Cigars  . Smokeless tobacco: Never Used  Substance and Sexual Activity  . Alcohol use: Yes    Comment: rare  . Drug use: No  . Sexual activity: Not on file  Other Topics Concern  . Not on file  Social History Narrative   Lives with wife in a 2 story home.  Has one child. Retired Animal nutritionist.      REVIEW OF  SYSTEMS: Constitutional: No fevers, chills, or sweats, no generalized fatigue, change in appetite Eyes: No visual changes, double vision, eye pain Ear, nose and throat: No hearing loss, ear pain, nasal congestion, sore throat Cardiovascular: No chest pain, palpitations Respiratory:  No shortness of breath at rest or with exertion, wheezes GastrointestinaI: No nausea, vomiting, diarrhea, abdominal pain, fecal incontinence Genitourinary:  No dysuria, urinary retention or frequency Musculoskeletal:  No neck pain, back pain Integumentary: No rash, pruritus, skin lesions Neurological: as above Psychiatric: No depression, insomnia, anxiety Endocrine: No palpitations, fatigue, diaphoresis, mood swings, change in appetite, change in weight, increased thirst Hematologic/Lymphatic:  No purpura, petechiae. Allergic/Immunologic: no itchy/runny eyes, nasal congestion, recent allergic reactions, rashes  PHYSICAL EXAM: Vitals:   11/26/17 1304  BP: 110/70  Pulse: (!) 58  SpO2: 95%   General: No acute distress.  Patient appears well-groomed.  Head:  Normocephalic/atraumatic Eyes:  fundi examined but not visualized Neck: supple, no paraspinal tenderness, full range of motion Back: No paraspinal tenderness Heart: regular rate and rhythm Lungs: Clear to auscultation bilaterally. Vascular: No carotid bruits. Neurological Exam: Mental status: alert and oriented to person, place, and time, recent and remote memory intact, fund of knowledge intact, attention and concentration intact, speech fluent but with bulbar dysarthria, language intact. Cranial nerves: CN I: not tested CN II: pupils equal, round and reactive to light, visual fields intact CN III, IV, VI:  full range of motion, no nystagmus, mild bilateral ptosis CN V: facial sensation intact CN VII: upper and lower face symmetric CN VIII: hearing intact CN IX, X: gag intact, uvula midline CN XI: sternocleidomastoid and trapezius muscles  intact CN XII: tongue midline Bulk & Tone: normal, no fasciculations. Motor:  4+/5 neck flexion; otherwise 5/5 throughout Able to count to 41 with deep inhalation. Sensation: temperature and vibration sensation intact. Deep Tendon Reflexes:  2+ throughout, toes downgoing.  Finger to nose testing:  Without dysmetria.  Heel to shin:  Without dysmetria.  Gait:  Normal station and stride.  Able to turn and tandem walk. Romberg negative.  IMPRESSION: Myasthenia gravis.  PLAN: 1.  Start prednisone 20mg  daily with food.  He should also take omeprazole 20mg  daily. 2.  Continue pyridostigmine 60mg  three times daily. 3.  He will follow up with Dr. Posey Pronto, our neuromuscular specialist, on Wednesday. 4.  If he begins to have trouble breathing or is unable to take any food or liquid, then I advised that he go to the hospital.  Thank you for allowing me to take part in the care of this patient.  Metta Clines, DO  CC:  [name], MD  Stevie Kern, MD

## 2017-11-26 NOTE — Patient Instructions (Signed)
1.  Start prednisone 20mg  daily with food.  Take with omeprazole 20mg  daily. 2.  Continue pyridostigmine 60mg  three times daily. 3.  If you start experiencing difficulty breathing or unable to eat/drink anything, then you need to go to the hospital. 4.  Follow up with Dr. Posey Pronto next week.

## 2017-12-02 ENCOUNTER — Telehealth: Payer: Self-pay | Admitting: *Deleted

## 2017-12-02 ENCOUNTER — Encounter: Payer: Self-pay | Admitting: Neurology

## 2017-12-02 ENCOUNTER — Ambulatory Visit: Payer: Medicare Other | Admitting: Neurology

## 2017-12-02 VITALS — BP 104/70 | HR 54 | Ht 71.0 in | Wt 245.2 lb

## 2017-12-02 DIAGNOSIS — Z7952 Long term (current) use of systemic steroids: Secondary | ICD-10-CM

## 2017-12-02 DIAGNOSIS — G7001 Myasthenia gravis with (acute) exacerbation: Secondary | ICD-10-CM | POA: Diagnosis not present

## 2017-12-02 MED ORDER — PREDNISONE 20 MG PO TABS: 20.0000 mg | ORAL_TABLET | Freq: Two times a day (BID) | ORAL | 1 refills | Status: DC

## 2017-12-02 NOTE — Patient Instructions (Addendum)
Maintain calcium intake of 1200 mg/day and vitamin D intake of 800 international units/day through either diet and/or supplements  Increase prednisone to 40mg  daily  Continues mestinon 60mg  three times daily   We will also start working on your insurance authorization for IVIG (intravenous immunoglobulin)  Follow-up with your primary care doctor  Return to clinic on March 14th at 3:30p

## 2017-12-02 NOTE — Progress Notes (Signed)
[facility] Clinic Note - Initial Visit   Date: 12/02/17  Rodney Martinez MRN: [id number] DOB: [birth year]   Dear Dr. Elease Hashimoto:  Thank you for your kind referral of Rodney Martinez for consultation of myasthenia gravis. Although his history is well known to you, please allow [name] to reiterate it for the purpose of our medical record. The patient was accompanied to the clinic by self.   History of Present Illness: Rodney Martinez is a 67 y.o. right-handed Caucasian male with OSA, hypothyroidism, and diabetes mellitus presenting for evaluation of seropositive myasthenia gravis.    Starting in mid January 2018, he developed slurred speech, difficulty swallowing, and weakness with moving his tongue. He also complains of droopiness of the eyes, but no double vision.  MRI brain on 1/22 did not show evidence of stroke, but due to progressive symptoms to went to the ER on 1/24. He was recommended to have in-patient evaluation, but choose to undergo out-patient work-up.  There was high clinical suspicion for myasthenia so he was started on mestinon 30mg  three times daily.  A week later, his AChR antibodies returned positive.  He saw my colleague, Dr. Tomi Likens last week who started him on prednisone 20mg  and increased mestinon to 60mg  three times daily and is here to establish care with me.  He has mild improvement with mestinon and it is too early to appreciate any benefit from steroids.    He denies any double vision, arm or leg weakness. He denies any shortness of breath.  He sleeps on 2 pillows at night, which is normal. Symptoms are always worse in the afternoon and by evening, his speech is barely discernable.  Rest tends to help.  Some mornings, he has difficulty swallowing his morning pills.    Out-side paper records, electronic medical record, and images have been reviewed where available and summarized as:  AChR binding 1.01*, blocking 61*, modulating 25*  Lab  Results  Component Value Date   TSH 3.75 11/02/2017   Lab Results  Component Value Date   CREATININE 0.95 10/30/2017   BUN 20 10/30/2017   NA 137 10/30/2017   K 4.6 10/30/2017   CL 104 10/30/2017   CO2 20 (L) 10/30/2017   Lab Results  Component Value Date   HGBA1C 7.9 (H) 11/02/2017   MRI brain wwo contrast 10/27/2017:  1. No acute intracranial process or abnormal enhancement. 2. Mild parenchymal brain volume loss for age.  Past Medical History:  Diagnosis Date  . GERD 12/12/2009  . HYPERGLYCEMIA 12/12/2009  . OBSTRUCTIVE SLEEP APNEA 08/14/2010  . TRANSAMINASES, SERUM, ELEVATED 12/12/2009    Past Surgical History:  Procedure Laterality Date  . BASAL CELL CARCINOMA EXCISION  2007   face (Mohs)  . HERNIA REPAIR  0973   umbilical     Medications:  Outpatient Encounter Medications as of 12/02/2017  Medication Sig  . glucosamine-chondroitin 500-400 MG tablet Take 1 tablet by mouth daily.  Marland Kitchen glucose blood test strip 1 each by Other route as needed. Reported on 04/07/2016  . Lancets (FREESTYLE) lancets 1 each by Other route as needed. Reported on 04/07/2016  . levothyroxine (SYNTHROID, LEVOTHROID) 25 MCG tablet TAKE 1 TABLET (25 MCG TOTAL) BY MOUTH DAILY BEFORE BREAKFAST.  . Melatonin 5 MG CAPS Take 5 mg by mouth at bedtime as needed (for sleep).   . predniSONE (DELTASONE) 20 MG tablet Take 1 tablet (20 mg total) by mouth 2 (two) times daily with a meal.  . pyridostigmine (MESTINON)  60 MG tablet Take 1 tablet (60 mg total) by mouth 3 (three) times daily.  . [DISCONTINUED] predniSONE (DELTASONE) 10 MG tablet Take 2 tablets (20 mg total) by mouth daily.   No facility-administered encounter medications on file as of 12/02/2017.      Allergies: No Known Allergies  Family History: Family History  Problem Relation Age of Onset  . Diabetes Mother        type ll  . Heart disease Father        ?atrial fibrillation  . Diabetes Maternal Grandfather   . Diabetes Paternal Grandmother     . Diabetes Paternal Grandfather   . Colon cancer Neg Hx   . Stomach cancer Neg Hx     Social History: Social History   Tobacco Use  . Smoking status: Current Every Day Smoker    Years: 10.00    Types: Cigars  . Smokeless tobacco: Never Used  Substance Use Topics  . Alcohol use: Yes    Comment: rare  . Drug use: No   Social History   Social History Narrative   Lives with wife in a 2 story home.  Has one child. Retired Animal nutritionist.      Review of Systems:  CONSTITUTIONAL: No fevers, chills, night sweats, + 20lb weight loss.   EYES: No visual changes or eye pain ENT: No hearing changes.  No history of nose bleeds.   RESPIRATORY: No cough, wheezing and shortness of breath.   CARDIOVASCULAR: Negative for chest pain, and palpitations.   GI: Negative for abdominal discomfort, blood in stools or black stools.  No recent change in bowel habits.   GU:  No history of incontinence.   MUSCLOSKELETAL: No history of joint pain or swelling.  No myalgias.   SKIN: Negative for lesions, rash, and itching.   HEMATOLOGY/ONCOLOGY: Negative for prolonged bleeding, bruising easily, and swollen nodes.  No history of cancer.   ENDOCRINE: Negative for cold or heat intolerance, polydipsia or goiter.   PSYCH:  No depression or anxiety symptoms.   NEURO: As Above.   Vital Signs:  BP 104/70   Pulse (!) 54   Ht 5\' 11"  (1.803 m)   Wt 245 lb 4 oz (111.2 kg)   SpO2 96%   BMI 34.21 kg/m    General Medical Exam:   General:  Well appearing, comfortable.   Eyes/ENT: see cranial nerve examination.   Neck: No masses appreciated.  Full range of motion without tenderness.  No carotid bruits. Respiratory:  Clear to auscultation, good air entry bilaterally.  He is able to count > 30 on single deep breath. Cardiac:  Regular rate and rhythm, no murmur.   Extremities:  No deformities, edema, or skin discoloration.  Skin:  No rashes or lesions.  Neurological Exam: MENTAL STATUS including orientation to  time, place, person, recent and remote memory, attention span and concentration, language, and fund of knowledge is normal.  Speech is dysarthric and nasal.  CRANIAL NERVES: II:  No visual field defects.  Unremarkable fundi.   III-IV-VI: Pupils equal round and reactive to light.  Normal conjugate, extra-ocular eye movements in all directions of gaze.  No nystagmus.  There is bilateral ptosis, worse on the left and with sustained upgaze.   V:  Normal facial sensation.     VII:  Normal facial symmetry and movements. Orbicularis oculi is 5/5, orbicularis oris 5-/5, buccinator 4/5.   VIII:  Normal hearing and vestibular function.   IX-X:  Normal palatal movement.  XI:  Normal shoulder shrug and head rotation.   XII:  Tongue movements are slowed and tongue strength is 4/5.  He is unable to tuck his tongue into the cheek.  MOTOR:  No atrophy, fasciculations or abnormal movements.  No pronator drift.  Tone is normal.   Neck flexion is 5/5.  No fatigability. Right Upper Extremity:    Left Upper Extremity:    Deltoid  5/5   Deltoid  5/5   Biceps  5/5   Biceps  5/5   Triceps  5/5   Triceps  5/5   Wrist extensors  5/5   Wrist extensors  5/5   Wrist flexors  5/5   Wrist flexors  5/5   Finger extensors  5/5   Finger extensors  5/5   Finger flexors  5/5   Finger flexors  5/5   Dorsal interossei  5/5   Dorsal interossei  5/5   Abductor pollicis  5/5   Abductor pollicis  5/5   Tone (Ashworth scale)  0  Tone (Ashworth scale)  0   Right Lower Extremity:    Left Lower Extremity:    Hip flexors  5/5   Hip flexors  5/5   Hip extensors  5/5   Hip extensors  5/5   Knee flexors  5/5   Knee flexors  5/5   Knee extensors  5/5   Knee extensors  5/5   Dorsiflexors  5/5   Dorsiflexors  5/5   Plantarflexors  5/5   Plantarflexors  5/5   Toe extensors  5/5   Toe extensors  5/5   Toe flexors  5/5   Toe flexors  5/5   Tone (Ashworth scale)  0  Tone (Ashworth scale)  0   MSRs:  Right                                                                  Left brachioradialis 2+  brachioradialis 2+  biceps 2+  biceps 2+  triceps 2+  triceps 2+  patellar 2+  patellar 2+  ankle jerk 2+  ankle jerk 2+  Hoffman no  Hoffman no  plantar response down  plantar response down   SENSORY:  Normal and symmetric perception of light touch and vibration.   COORDINATION/GAIT: Normal finger-to- nose-finger.  Intact rapid alternating movements bilaterally.  Gait narrow based and stable.    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  He was started on prednisone 20mg  last week and does not appreciate any improvement, but it can take up to 2 weeks to see any improvement.  Given the severity of his bulbar weakness, I recommend start prior authorization for IVIG 2g/kg over 5 days.  Because this may take 1-2 weeks to coordinate, I also will increase his prednisone to 40mg /d with the goal to slowly taper it once the disease is controlled.  We discussed at length about steroid side effects, namely increasing his blood sugars.  He is not taking any diabetes medications but with his last HbA1c 7.9 and moderate dose of corticosteroids, he most likely will need close monitoring and start diabetes medication.  I have asked him to follow-up with his primary care provider for diabetes management.   I had  an extensive discussion with the patient regarding the diagnosis, pathogenesis, prognosis, management plan, meds to avoid, and potential crisis myasthenia gravis. I also discussed the warning signs and symptoms of MG crisis (including significant swallowing and breathing difficulty) that should prompt urgent medical evaluation since an exacerbation of myasthenia gravis is potentially life-threatening.   PLAN/RECOMMENDATIONS:  1.  Increase prednisone to 40mg  daily.  He will need close follow-up with PCP to monitor blood sugars, whose assistance is appreciated. 2.  Start PA for IVIG  0.4mg /kg x 5 days 3.  Continue mestinon 60mg  three times daily.  Timespan mestinon declined as morning dysphagia is not severe.  4.  Start calcium supplementation to maintain 1200mg /d and vitamin D 800 IU/d 5.  Continue pepcid for GI prophylaxis 6.  If he needs to stay on moderate-high dose prednisone for longer than one month, will need to start bactrim  He will need close monitoring and return to see me in 2 weeks.   The duration of this appointment visit was 45 minutes of face-to-face time with the patient.  Greater than 50% of this time was spent in counseling, explanation of diagnosis, planning of further management, and coordination of care.   Thank you for allowing me to participate in patient's care.  If I can answer any additional questions, I would be pleased to do so.    Sincerely,    Donika K. Posey Pronto, DO

## 2017-12-02 NOTE — Telephone Encounter (Signed)
-----   Message from [name], MD sent at 12/02/2017 11:38 AM EST ----- Apolonio Schneiders,  Can we get him in soon to discuss.  Neurologist has him on increased steroids which is elevating his sugars.

## 2017-12-02 NOTE — Telephone Encounter (Signed)
Left message on machine for patient to return our call.  CRM created 

## 2017-12-04 ENCOUNTER — Telehealth: Payer: Self-pay | Admitting: Neurology

## 2017-12-04 NOTE — Telephone Encounter (Signed)
[facility] left a voicemail message asking if you can refax order, all of it did not go through [phone]

## 2017-12-04 NOTE — Telephone Encounter (Signed)
Order sent again °

## 2017-12-09 ENCOUNTER — Telehealth: Payer: Self-pay | Admitting: Family Medicine

## 2017-12-09 ENCOUNTER — Ambulatory Visit: Payer: Medicare Other | Admitting: Family Medicine

## 2017-12-09 ENCOUNTER — Encounter: Payer: Self-pay | Admitting: Family Medicine

## 2017-12-09 VITALS — BP 110/64 | HR 43 | Temp 97.7°F | Wt 246.0 lb

## 2017-12-09 DIAGNOSIS — R7309 Other abnormal glucose: Secondary | ICD-10-CM | POA: Diagnosis not present

## 2017-12-09 LAB — POCT GLUCOSE (DEVICE FOR HOME USE): POC Glucose: 211 mg/dl — AB (ref 70–99)

## 2017-12-09 MED ORDER — GLUCOSE BLOOD VI STRP: ORAL_STRIP | 3 refills | Status: DC

## 2017-12-09 MED ORDER — ACCU-CHEK FASTCLIX LANCETS MISC: 3 refills | Status: DC

## 2017-12-09 MED ORDER — METFORMIN HCL 500 MG PO TABS: 500.0000 mg | ORAL_TABLET | Freq: Two times a day (BID) | ORAL | 3 refills | Status: DC

## 2017-12-09 NOTE — Patient Instructions (Signed)
Monitor blood sugars several times per week Let me know if sugars consistently > 180.

## 2017-12-09 NOTE — Progress Notes (Signed)
Subjective:     Patient ID: Rodney Martinez, male   DOB: 03-Dec-1950, 67 y.o.   MRN: [id number]  HPI Patient is seen for follow-up diabetes. Recent diagnosis of myasthenia gravis. He is currently on Mestinon 60 mg 3 times a day and prednisone 20 mg twice a day. Neurologist is trying to get prior authorization for immunoglobulin treatment. Patient is currently not taking any medications for diabetes. Denies any polyuria or polydipsia. He is having great difficulty swallowing at times.  Had recent A1c of 7.9%. Blood sugar today one hour after Ensure 211.  He does not have home glucose monitor currently. He has normal renal function  Past Medical History:  Diagnosis Date  . GERD 12/12/2009  . HYPERGLYCEMIA 12/12/2009  . OBSTRUCTIVE SLEEP APNEA 08/14/2010  . TRANSAMINASES, SERUM, ELEVATED 12/12/2009   Past Surgical History:  Procedure Laterality Date  . BASAL CELL CARCINOMA EXCISION  2007   face (Mohs)  . HERNIA REPAIR  8786   umbilical    reports that he has been smoking cigars.  He has smoked for the past 10.00 years. he has never used smokeless tobacco. He reports that he drinks alcohol. He reports that he does not use drugs. family history includes Diabetes in his maternal grandfather, mother, paternal grandfather, and paternal grandmother; Heart disease in his father. No Known Allergies'   Review of Systems  Constitutional: Negative for fatigue.  Eyes: Negative for visual disturbance.  Respiratory: Negative for cough, chest tightness and shortness of breath.   Cardiovascular: Negative for chest pain, palpitations and leg swelling.  Neurological: Negative for dizziness, syncope, weakness, light-headedness and headaches.       Objective:   Physical Exam  Constitutional: He appears well-developed and well-nourished.  Cardiovascular: Normal rate and regular rhythm.  Pulmonary/Chest: Effort normal and breath sounds normal. No respiratory distress. He has no wheezes. He has no rales.       Assessment:     Type 2 diabetes currently untreated and with high risk for exacerbation with concomitant prednisone use for his myasthenia gravis    Plan:     -Home glucose monitor given with instructions for use -Monitor blood sugars very closely over the next several weeks -Start metformin 500 mg twice a day. We did discuss that if he is having difficulty swallowing we may have to look at other alternatives and even insulin potentially -Check blood sugars fasting several days per week and occasional 2 hour postprandials. Be in touch if consistent blood sugars over 180 after starting the metformin -Reassess in one month and sooner as needed  Rodney Post MD [facility]

## 2017-12-09 NOTE — Telephone Encounter (Signed)
Rx sent 

## 2017-12-09 NOTE — Telephone Encounter (Signed)
Copied from [place]. Topic: General - Other >> Dec 09, 2017 12:34 PM Cecelia Byars, NT wrote: Reason for CRM: Patient said  he was told today to contact his insurance company to find out what they will cover for a glucose meter he was told they  they will cover whatever medicare says it will cover or whatever medicare covers , please advise

## 2017-12-10 ENCOUNTER — Telehealth: Payer: Self-pay | Admitting: Neurology

## 2017-12-10 NOTE — Telephone Encounter (Signed)
Looks like something was ordered through [employer].   Caryl Pina - please advise on this.

## 2017-12-10 NOTE — Telephone Encounter (Signed)
Patient called and received information in the mail regarding IV Treatment? He is unsure how to get it started. Please call. Thanks

## 2017-12-11 NOTE — Telephone Encounter (Signed)
Left message for patient letting him know that I will send a message to [facility] to get the infusion scheduled.

## 2017-12-11 NOTE — Telephone Encounter (Signed)
Pt called and wants to know how to get started in the IV treatment, has been approved for it, please call and advise

## 2017-12-15 ENCOUNTER — Telehealth: Payer: Self-pay | Admitting: Neurology

## 2017-12-15 NOTE — Telephone Encounter (Signed)
Left message informing patient that I am trying to reach the [facility] rep to find out what is going on.

## 2017-12-15 NOTE — Telephone Encounter (Signed)
Sent Rodney Martinez a message to please contact patient.

## 2017-12-15 NOTE — Telephone Encounter (Signed)
Pt left a VM message saying he has not heard from in home IV, please call and advise

## 2017-12-16 ENCOUNTER — Other Ambulatory Visit: Payer: Self-pay | Admitting: Family Medicine

## 2017-12-16 MED ORDER — LEVOTHYROXINE SODIUM 25 MCG PO TABS: 25.0000 ug | ORAL_TABLET | Freq: Every day | ORAL | 1 refills | Status: DC

## 2017-12-16 NOTE — Telephone Encounter (Signed)
Copied from [place] [id number] [id number]. Topic: Quick Communication - Rx Refill/Question >> Dec 16, 2017  1:52 PM Lolita Rieger, [place] wrote: Medication: levothyroxine 25 mg   Has the patient contacted their pharmacy? yes   (Agent: If no, request that the patient contact the pharmacy for the refill.)   Preferred Pharmacy (with phone number or street name [facility] [place]   Agent: Please be advised that RX refills may take up to 3 business days. We ask that you follow-up with your pharmacy.

## 2017-12-17 ENCOUNTER — Encounter: Payer: Self-pay | Admitting: Neurology

## 2017-12-17 ENCOUNTER — Ambulatory Visit: Payer: Medicare Other | Admitting: Neurology

## 2017-12-17 ENCOUNTER — Other Ambulatory Visit (INDEPENDENT_AMBULATORY_CARE_PROVIDER_SITE_OTHER): Payer: Medicare Other

## 2017-12-17 ENCOUNTER — Telehealth: Payer: Self-pay | Admitting: Family Medicine

## 2017-12-17 VITALS — BP 130/70 | HR 48 | Ht 71.0 in | Wt 236.0 lb

## 2017-12-17 DIAGNOSIS — Z7952 Long term (current) use of systemic steroids: Secondary | ICD-10-CM

## 2017-12-17 DIAGNOSIS — G7001 Myasthenia gravis with (acute) exacerbation: Secondary | ICD-10-CM | POA: Diagnosis not present

## 2017-12-17 LAB — COMPREHENSIVE METABOLIC PANEL
ALBUMIN: 4.3 g/dL (ref 3.5–5.2)
ALK PHOS: 45 U/L (ref 39–117)
ALT: 87 U/L — ABNORMAL HIGH (ref 0–53)
AST: 34 U/L (ref 0–37)
BUN: 18 mg/dL (ref 6–23)
CALCIUM: 9.3 mg/dL (ref 8.4–10.5)
CHLORIDE: 103 meq/L (ref 96–112)
CO2: 32 mEq/L (ref 19–32)
CREATININE: 0.95 mg/dL (ref 0.40–1.50)
GFR: 83.5 mL/min (ref >60.00)
Glucose, Bld: 203 mg/dL — ABNORMAL HIGH (ref 70–99)
POTASSIUM: 4.8 meq/L (ref 3.5–5.1)
Sodium: 137 mEq/L (ref 135–145)
TOTAL PROTEIN: 6.4 g/dL (ref 6.0–8.3)
Total Bilirubin: 0.6 mg/dL (ref 0.2–1.2)

## 2017-12-17 NOTE — Progress Notes (Signed)
Follow-up Visit   Date: 12/17/17    Rodney Martinez MRN: [id number] DOB: 15-Nov-1950   Interim History: Rodney Martinez is a 67 y.o. right-handed Caucasian male with OSA, hypothyroidism, and diabetes mellitus returning to the clinic for follow-up of seropositive myasthenia gravis.  The patient was accompanied to the clinic by wife who also provides collateral information.    History of present illness: Starting in mid January 2018, he developed slurred speech, difficulty swallowing, and weakness with moving his tongue. He also complains of droopiness of the eyes, but no double vision.  MRI brain on 1/22 did not show evidence of stroke, but due to progressive symptoms to went to the ER on 1/24. He was recommended to have in-patient evaluation, but choose to undergo out-patient work-up.  There was high clinical suspicion for myasthenia so he was started on mestinon 30mg  three times daily.  A week later, his AChR antibodies returned positive.  He saw my colleague, Dr. Tomi Likens initially who started him on prednisone 20mg  and increased mestinon to 60mg  three times daily and is here to establish care with me.  He has mild improvement with mestinon.  UPDATE 12/17/2017: He is here today with his wife for a follow-up visit.  Despite being on prednisone 40mg  daily, he has not appreciated any improvement in his speech, swallowing, or ptosis. Wife states that his speech gets markedly worse by the evening and it is barely discernable.  His swallowing is improved by the afternoon which is when he is able to eat soft foods.  He is having difficulty with thin liquids such as water.  He does not have any weakness of the arms and legs, shortness of breath, or double vision.  He was started on metformin given elevated blood sugars.   Medications:  Current Outpatient Medications on File Prior to Visit  Medication Sig Dispense Refill  . ACCU-CHEK FASTCLIX LANCETS MISC Test once daily.  Dx E11.9 102 each 3  .  Calcium-Magnesium-Vitamin D (CALCIUM 500 PO) Take by mouth.    Marland Kitchen glucose blood (ACCU-CHEK GUIDE) test strip Test once daily.  Dx e11.9 100 each 3  . levothyroxine (SYNTHROID, LEVOTHROID) 25 MCG tablet Take 1 tablet (25 mcg total) by mouth daily before breakfast. 90 tablet 1  . Melatonin 5 MG CAPS Take 5 mg by mouth at bedtime as needed (for sleep).     . metFORMIN (GLUCOPHAGE) 500 MG tablet Take 1 tablet (500 mg total) by mouth 2 (two) times daily with a meal. 180 tablet 3  . predniSONE (DELTASONE) 20 MG tablet Take 40 mg by mouth daily with breakfast.    . pyridostigmine (MESTINON) 60 MG tablet Take 1 tablet (60 mg total) by mouth 3 (three) times daily. 90 tablet 1   No current facility-administered medications on file prior to visit.     Allergies: No Known Allergies  Review of Systems:  CONSTITUTIONAL: No fevers, chills, night sweats, +weight loss.  EYES: No visual changes or eye pain ENT: No hearing changes.  No history of nose bleeds.   RESPIRATORY: No cough, wheezing and shortness of breath.   CARDIOVASCULAR: Negative for chest pain, and palpitations.   GI: Negative for abdominal discomfort, blood in stools or black stools.  No recent change in bowel habits.   GU:  No history of incontinence.   MUSCLOSKELETAL: No history of joint pain or swelling.  No myalgias.   SKIN: Negative for lesions, rash, and itching.   ENDOCRINE: Negative for cold or heat  intolerance, polydipsia or goiter.   PSYCH:  No depression or anxiety symptoms.   NEURO: As Above.   Vital Signs:  BP 130/70   Pulse (!) 48   Ht 5\' 11"  (1.803 m)   Wt 236 lb (107 kg)   SpO2 96%   BMI 32.92 kg/m    General: Well appearing, comfortable, bilateral ptosis  Neurological Exam: MENTAL STATUS including orientation to time, place, person, recent and remote memory, attention span and concentration, language, and fund of knowledge is normal.  Speech is not dysarthric.  CRANIAL NERVES: No visual field defects.  Pupils  equal round and reactive to light.  Right eye is injected.  Normal conjugate, extra-ocular eye movements in all directions of gaze. Bilateral moderate ptosis, worse with sustained upgaze. Face is symmetric.  Orbicularis oculi is 5-/5, buccinator 4/5, orbicularis oris 5-./5.  Palate elevates symmetrically.  Tongue is midline, tongue movements are very slow and he is barely able to tuck the tongue into the cheek.   MOTOR:  Motor strength is 5/5 in all extremities, including neck flexion. No pronator drift.  Tone is normal.    COORDINATION/GAIT:    Gait narrow based and stable.   Data: AChR binding 1.01*, blocking 61*, modulating 25*  MRI brain wwo contrast 10/27/2017:  1. No acute intracranial process or abnormal enhancement. 2. Mild parenchymal brain volume loss for age.  IMPRESSION/PLAN: 1.  Seropositive bulbar myasthenia gravis with exacerbation (diagnosed 11/2017), no improvement and still with moderate bulbar weakness  - Continue prednisone 40mg  daily   - He has been approved for IVIG and will be starting it this week (0.4mg /kg x 5 days), followed by monthly 0.4mg /kg   - Continue mestinon 60mg  TID  - Check CBC, CMP, and TPMT as I anticipate he will need to start steroid-sparing agent  - Recommend using thickner for thin liquids  2.  Long-term corticosteroid use      - Continue calcium supplementation to maintain 1200mg /d and vitamin D 800 IU/d    - Continue pepcid for GI prophylaxis  3.  Steroid-induced diabetes, appreciate PCP managing   Return to clinic in 2 weeks  Greater than 50% of this 25 minute visit was spent in counseling, explanation of diagnosis, planning of further management, and coordination of care.  Thank you for allowing me to participate in patient's care.  If I can answer any additional questions, I would be pleased to do so.    Sincerely,    Mao Lockner K. Posey Pronto, DO

## 2017-12-17 NOTE — Patient Instructions (Addendum)
Check labs  Continue prednisone 40mg  daily and mestinon 60mg  three times daily      V  Return to clinic on April 28th at 11:30am.

## 2017-12-17 NOTE — Telephone Encounter (Signed)
Copied from [place] [id number]. Topic: Quick Communication - Rx Refill/Question >> Dec 17, 2017  3:00 PM Neva Seat wrote: Test Strips  Lancets needed for BS monitor  Pt needing Rx's for [facility] monitor  [place], [place] [address] [place] [place] [address] Phone: [phone] Fax: [phone]

## 2017-12-18 MED ORDER — ACCU-CHEK FASTCLIX LANCETS MISC: 3 refills | Status: DC

## 2017-12-18 MED ORDER — GLUCOSE BLOOD VI STRP: ORAL_STRIP | 3 refills | Status: DC

## 2017-12-18 NOTE — Telephone Encounter (Signed)
Rx sent 

## 2017-12-19 DIAGNOSIS — G7001 Myasthenia gravis with (acute) exacerbation: Secondary | ICD-10-CM | POA: Diagnosis not present

## 2017-12-20 DIAGNOSIS — G7001 Myasthenia gravis with (acute) exacerbation: Secondary | ICD-10-CM | POA: Diagnosis not present

## 2017-12-21 DIAGNOSIS — G7001 Myasthenia gravis with (acute) exacerbation: Secondary | ICD-10-CM | POA: Diagnosis not present

## 2017-12-22 ENCOUNTER — Telehealth: Payer: Self-pay | Admitting: Neurology

## 2017-12-22 ENCOUNTER — Other Ambulatory Visit: Payer: Self-pay | Admitting: *Deleted

## 2017-12-22 DIAGNOSIS — G7001 Myasthenia gravis with (acute) exacerbation: Secondary | ICD-10-CM | POA: Diagnosis not present

## 2017-12-22 MED ORDER — PREDNISONE 20 MG PO TABS: 40.0000 mg | ORAL_TABLET | Freq: Every day | ORAL | 0 refills | Status: DC

## 2017-12-22 NOTE — Telephone Encounter (Signed)
Rx sent 

## 2017-12-22 NOTE — Telephone Encounter (Signed)
° °  1.     Which medications need to be refilled? (please list name of each medication and dose if know) prednisone   2.     Which pharmacy/location (including street and city if local pharmacy) is medication to be sent t? [facility]  [address]     3.     Do they need a 30 or 90 day supply? 90   Patient is wanting to use the [place]

## 2017-12-23 DIAGNOSIS — G7001 Myasthenia gravis with (acute) exacerbation: Secondary | ICD-10-CM | POA: Diagnosis not present

## 2017-12-23 DIAGNOSIS — E119 Type 2 diabetes mellitus without complications: Secondary | ICD-10-CM | POA: Diagnosis not present

## 2017-12-24 LAB — THIOPURINE METHYLTRANSFERASE (TPMT), RBC: Thiopurine Methyltransferase, RBC: 17 nmol/hr/mL RBC

## 2017-12-31 ENCOUNTER — Other Ambulatory Visit: Payer: Self-pay | Admitting: *Deleted

## 2017-12-31 ENCOUNTER — Encounter: Payer: Self-pay | Admitting: Neurology

## 2017-12-31 ENCOUNTER — Ambulatory Visit: Payer: Medicare Other | Admitting: Neurology

## 2017-12-31 ENCOUNTER — Telehealth: Payer: Self-pay | Admitting: *Deleted

## 2017-12-31 VITALS — BP 110/68 | HR 45 | Ht 71.0 in | Wt 234.2 lb

## 2017-12-31 DIAGNOSIS — Z7952 Long term (current) use of systemic steroids: Secondary | ICD-10-CM

## 2017-12-31 DIAGNOSIS — R001 Bradycardia, unspecified: Secondary | ICD-10-CM

## 2017-12-31 DIAGNOSIS — G7001 Myasthenia gravis with (acute) exacerbation: Secondary | ICD-10-CM

## 2017-12-31 MED ORDER — PYRIDOSTIGMINE BROMIDE 60 MG PO TABS: 60.0000 mg | ORAL_TABLET | Freq: Three times a day (TID) | ORAL | 3 refills | Status: DC

## 2017-12-31 NOTE — Patient Instructions (Addendum)
CT chest with contrast  Increase prednisone 60mg  daily  Continue mestinon 60mg  three times daily  Continue IVIG   Return to clinic on April 16th at 3pm.

## 2017-12-31 NOTE — Telephone Encounter (Signed)
-----   Message from Alda Berthold, DO sent at 12/31/2017  2:35 PM EDT ----- Please order 12-lead EKG for bradycardia for this patient. Thanks.

## 2017-12-31 NOTE — Telephone Encounter (Signed)
Order placed and left message for patient to go to Dr. Erick Blinks office tomorrow at 2:30.

## 2017-12-31 NOTE — Progress Notes (Signed)
Follow-up Visit   Date: 12/31/17    DASTAN KRIDER MRN: [id number] DOB: [birth year]   Interim History: Rodney Martinez is a 67 y.o. right-handed Caucasian male with OSA, hypothyroidism, and diabetes mellitus returning to the clinic for follow-up of seropositive myasthenia gravis.  The patient was accompanied to the clinic by wife who also provides collateral information.    History of present illness: Starting in mid January 2018, he developed slurred speech, difficulty swallowing, and weakness with moving his tongue. He also complains of droopiness of the eyes, but no double vision.  MRI brain on 1/22 did not show evidence of stroke, but due to progressive symptoms to went to the ER on 1/24. He was recommended to have in-patient evaluation, but choose to undergo out-patient work-up.  There was high clinical suspicion for myasthenia so he was started on mestinon 30mg  three times daily.  A week later, his AChR antibodies returned positive.  He saw my colleague, Dr. Tomi Likens initially who started him on prednisone 20mg  and increased mestinon to 60mg  three times daily and is here to establish care with me.  He has mild improvement with mestinon.  UPDATE 12/17/2017: He is here today with his wife for a follow-up visit.  Despite being on prednisone 40mg  daily, he has not appreciated any improvement in his speech, swallowing, or ptosis. Wife states that his speech gets markedly worse by the evening and it is barely discernable.  His swallowing is improved by the afternoon which is when he is able to eat soft foods.  He is having difficulty with thin liquids such as water.  He does not have any weakness of the arms and legs, shortness of breath, or double vision.  He was started on metformin given elevated blood sugars.   UPDATE 12/31/2017:  He completed IVIG last Wednesday and by Friday, starting having some improvement with swallowing and to a lesser degree speech.  He has not had any choking episodes.  He has been able to eat more soft foods, but is still very cautious with hard textured foods.  He has some improvement with droopy eyelids.  Speech continues to be slurred especially with prolonged talking.  He easily gets fatigued and usually takes a daily nap. No shortness of breath or double vision.   Medications:  Current Outpatient Medications on File Prior to Visit  Medication Sig Dispense Refill  . ACCU-CHEK FASTCLIX LANCETS MISC Test once daily.  Dx E11.9 102 each 3  . Calcium-Magnesium-Vitamin D (CALCIUM 500 PO) Take by mouth.    Marland Kitchen glucose blood (ACCU-CHEK GUIDE) test strip Test once daily.  Dx e11.9 100 each 3  . levothyroxine (SYNTHROID, LEVOTHROID) 25 MCG tablet Take 1 tablet (25 mcg total) by mouth daily before breakfast. 90 tablet 1  . Melatonin 5 MG CAPS Take 5 mg by mouth at bedtime as needed (for sleep).     . metFORMIN (GLUCOPHAGE) 500 MG tablet Take 1 tablet (500 mg total) by mouth 2 (two) times daily with a meal. 180 tablet 3  . predniSONE (DELTASONE) 20 MG tablet Take 2 tablets (40 mg total) by mouth daily with breakfast. (Patient taking differently: Take 20 mg by mouth 2 (two) times daily with a meal. ) 180 tablet 0   No current facility-administered medications on file prior to visit.     Allergies: No Known Allergies  Review of Systems:  CONSTITUTIONAL: No fevers, chills, night sweats, +weight loss.  EYES: No visual changes or eye pain ENT: No  hearing changes.  No history of nose bleeds.   RESPIRATORY: No cough, wheezing and shortness of breath.   CARDIOVASCULAR: Negative for chest pain, and palpitations.   GI: Negative for abdominal discomfort, blood in stools or black stools.  No recent change in bowel habits.   GU:  No history of incontinence.   MUSCLOSKELETAL: No history of joint pain or swelling.  No myalgias.   SKIN: Negative for lesions, rash, and itching.   ENDOCRINE: Negative for cold or heat intolerance, polydipsia or goiter.   PSYCH:  No depression or  anxiety symptoms.   NEURO: As Above.   Vital Signs:  BP 110/68   Pulse (!) 45   Ht 5\' 11"  (1.803 m)   Wt 234 lb 4 oz (106.3 kg)   SpO2 97%   BMI 32.67 kg/m    General: Well appearing, comfortable CV:  Bradycardic rate and regular rhythm Pulm:  Clear Ext: no edema   Neurological Exam: MENTAL STATUS including orientation to time, place, person, recent and remote memory, attention span and concentration, language, and fund of knowledge is normal.  Speech is moderately dysarthric, sounds breathy.  CRANIAL NERVES:  Pupils equal round and reactive to light. Normal conjugate, extra-ocular eye movements in all directions of gaze. Mild bilateral ptosis, worse with sustained upgaze. Face is symmetric.  Orbicularis oris and oculi is 5/5 (improved); buccinator 4+/5.  Palate elevates symmetrically.  Tongue is midline, tongue movements are slowed, he is able to move the tongue to the corner of the mouth, but unable to tuck into his cheek.  MOTOR:  Motor strength is 5/5 in all extremities, including neck flexion. No fatigability.    COORDINATION/GAIT:    Gait narrow based and stable.   Data: AChR binding 1.01*, blocking 61*, modulating 25*  MRI brain wwo contrast 10/27/2017:  1. No acute intracranial process or abnormal enhancement. 2. Mild parenchymal brain volume loss for age.  IMPRESSION/PLAN: 1.  Seropositive bulbar myasthenia gravis with exacerbation (diagnosed 11/2017), still with moderate dysarthria and bulbar weakness.  He has some improvement with induction course of IVIG.  - Increase prednisone to 60mg  daily  - Continue IVIG 0.4mg  every 3 weeks  - Continue mestinon 60mg  TID  - CT chest with contrast to evaluate for thymoma  - Labs indicate mild chronic transaminitis. Plan to start Cellcept as steroid-sparing agent at the next visit  2.  Long-term corticosteroid use  - Continue calcium supplementation to maintain 1200mg /d and vitamin D 800 IU/d  - Continue pepcid for GI  prophylaxis  3.  Steroid-induced diabetes, appreciate PCP following  4.  Asymptomatic bradycardia, will get 12-lead EKG and request Dr. Elease Hashimoto to determine further testing  Return to clinic in 3 weeks  Greater than 50% of this 30 minute visit was spent in counseling, explanation of diagnosis, planning of further management, and coordination of care.   Thank you for allowing me to participate in patient's care.  If I can answer any additional questions, I would be pleased to do so.    Sincerely,    Nahal Wanless K. Posey Pronto, DO

## 2018-01-01 ENCOUNTER — Encounter: Payer: Self-pay | Admitting: Family Medicine

## 2018-01-01 ENCOUNTER — Ambulatory Visit: Payer: Medicare Other | Admitting: Family Medicine

## 2018-01-01 VITALS — BP 102/58 | HR 51 | Temp 98.1°F | Ht 71.0 in | Wt 234.6 lb

## 2018-01-01 DIAGNOSIS — E1165 Type 2 diabetes mellitus with hyperglycemia: Secondary | ICD-10-CM | POA: Diagnosis not present

## 2018-01-01 DIAGNOSIS — R001 Bradycardia, unspecified: Secondary | ICD-10-CM

## 2018-01-01 DIAGNOSIS — G7001 Myasthenia gravis with (acute) exacerbation: Secondary | ICD-10-CM | POA: Diagnosis not present

## 2018-01-01 NOTE — Progress Notes (Signed)
Subjective:     Patient ID: Rodney Martinez, male   DOB: 1951/02/28, 67 y.o.   MRN: [id number]  HPI Patient seen at request of neurology for some recent bradycardia which is asymptomatic. He states his normal baseline pulse is around 60-65. He has myasthenia gravis and was recently started on Mestinon currently 60 mg every 8 hours. He is also on prednisone currently 60 mg daily as well as recent IV immunoglobulin.  He is not sure he has had any benefit clinically from the Mestinon but has seen some improvement recently following IV immunoglobulin  Was seen recently by neurology and had pulse around 45. He has had no syncope. No dizziness. No chest pains. No dyspnea. Does not take any beta blockers or calcium channel blockers. He is on low-dose levothyroxin.  Type 2 diabetes with recent exacerbation by prednisone use. Recent fasting blood sugars around 200. Recently initiated metformin 500 mg twice daily. He had A1c 7.9% in January  Past Medical History:  Diagnosis Date  . GERD 12/12/2009  . HYPERGLYCEMIA 12/12/2009  . OBSTRUCTIVE SLEEP APNEA 08/14/2010  . TRANSAMINASES, SERUM, ELEVATED 12/12/2009   Past Surgical History:  Procedure Laterality Date  . BASAL CELL CARCINOMA EXCISION  2007   face (Mohs)  . HERNIA REPAIR  2620   umbilical    reports that he has been smoking cigars.  He has smoked for the past 10.00 years. He has never used smokeless tobacco. He reports that he drinks alcohol. He reports that he does not use drugs. family history includes Diabetes in his maternal grandfather, mother, paternal grandfather, and paternal grandmother; Heart disease in his father. No Known Allergies   Review of Systems  Constitutional: Negative for chills, fatigue and fever.  Eyes: Negative for visual disturbance.  Respiratory: Negative for cough, chest tightness and shortness of breath.   Cardiovascular: Negative for chest pain, palpitations and leg swelling.  Neurological: Negative for dizziness,  syncope, weakness, light-headedness and headaches.       Objective:   Physical Exam  Constitutional: He is oriented to person, place, and time. He appears well-developed and well-nourished.  HENT:  Right Ear: External ear normal.  Left Ear: External ear normal.  Mouth/Throat: Oropharynx is clear and moist.  Eyes: Pupils are equal, round, and reactive to light.  Neck: Neck supple. No thyromegaly present.  Cardiovascular: Regular rhythm.  Heart rate around 50 and regular  Pulmonary/Chest: Effort normal and breath sounds normal. No respiratory distress. He has no wheezes. He has no rales.  Musculoskeletal: He exhibits no edema.  Neurological: He is alert and oriented to person, place, and time.       Assessment:     #1 asymptomatic bradycardia probably related to Mestinon in a patient with baseline borderline bradycardia. EKG today shows sinus bradycardia with heart rate around 50. No evidence for heart block  #2 type 2 diabetes exacerbated by current prednisone use  #3 myasthenia gravis    Plan:     -Increase metformin to 500 mg 2 twice daily -Continue close monitoring of blood sugars -Follow-up immediately for any syncope, dizziness, or other concerns -We discussed possible referral to cardiology but at this point since he is asymptomatic did not see any immediate indication -Will check with neurology to see whether there may be consideration to reduce Mestinon dosage  [name] MD [facility]

## 2018-01-01 NOTE — Patient Instructions (Signed)
Bradycardia, Adult Bradycardia is a slower-than-normal heartbeat. A normal resting heart rate for an adult ranges from 60 to 100 beats per minute. With bradycardia, the resting heart rate is less than 60 beats per minute. Bradycardia can prevent enough oxygen from reaching certain areas of your body when you are active. It can be serious if it keeps enough oxygen from reaching your brain and other parts of your body. Bradycardia is not a problem for everyone. For some healthy adults, a slow resting heart rate is normal. What are the causes? This condition may be caused by:  A problem with the heart, including: ? A problem with the heart's electrical system, such as a heart block. ? A problem with the heart's natural pacemaker (sinus node). ? Heart disease. ? A heart attack. ? Heart damage. ? A heart infection. ? A heart condition that is present at birth (congenital heart defect).  Certain medicines that treat heart conditions.  Certain conditions, such as hypothyroidism and obstructive sleep apnea.  Problems with the balance of chemicals and other substances, like potassium, in the blood.  What increases the risk? This condition is more likely to develop in adults who:  Are age 75 or older.  Have high blood pressure (hypertension), high cholesterol (hyperlipidemia), or diabetes.  Drink heavily, use tobacco or nicotine products, or use drugs.  Are stressed.  What are the signs or symptoms? Symptoms of this condition include:  Light-headedness.  Feeling faint or fainting.  Fatigue and weakness.  Shortness of breath.  Chest pain (angina).  Drowsiness.  Confusion.  Dizziness.  How is this diagnosed? This condition may be diagnosed based on:  Your symptoms.  Your medical history.  A physical exam.  During the exam, your health care provider will listen to your heartbeat and check your pulse. To confirm the diagnosis, your health care provider may order tests,  such as:  Blood tests.  An electrocardiogram (ECG). This test records the heart's electrical activity. The test can show how fast your heart is beating and whether the heartbeat is steady.  A test in which you wear a portable device (event recorder or Holter monitor) to record your heart's electrical activity while you go about your day.  Anexercise test.  How is this treated? Treatment for this condition depends on the cause of the condition and how severe your symptoms are. Treatment may involve:  Treatment of the underlying condition.  Changing your medicines or how much medicine you take.  Having a small, battery-operated device called a pacemaker implanted under the skin. When bradycardia occurs, this device can be used to increase your heart rate and help your heart to beat in a regular rhythm.  Follow these instructions at home: Lifestyle   Manage any health conditions that contribute to bradycardia as told by your health care provider.  Follow a heart-healthy diet. A nutrition specialist (dietitian) can help to educate you about healthy food options and changes.  Follow an exercise program that is approved by your health care provider.  Maintain a healthy weight.  Try to reduce or manage your stress, such as with yoga or meditation. If you need help reducing stress, ask your health care provider.  Do not use use any products that contain nicotine or tobacco, such as cigarettes and e-cigarettes. If you need help quitting, ask your health care provider.  Do not use illegal drugs.  Limit alcohol intake to no more than 1 drink per day for nonpregnant women and 2 drinks per  day for men. One drink equals 12 oz of beer, 5 oz of wine, or 1 oz of hard liquor. General instructions  Take over-the-counter and prescription medicines only as told by your health care provider.  Keep all follow-up visits as directed by your health care provider. This is important. How is this  prevented? In some cases, bradycardia may be prevented by:  Treating underlying medical problems.  Stopping behaviors or medicines that can trigger the condition.  Contact a health care provider if:  You feel light-headed or dizzy.  You almost faint.  You feel weak or are easily fatigued during physical activity.  You experience confusion or have memory problems. Get help right away if:  You faint.  You have an irregular heartbeat (palpitations).  You have chest pain.  You have trouble breathing. This information is not intended to replace advice given to you by your health care provider. Make sure you discuss any questions you have with your health care provider. Document Released: 06/14/2002 Document Revised: 05/20/2016 Document Reviewed: 03/13/2016 Elsevier Interactive Patient Education  2017 [employer].  Would probably go ahead and increase the Metformin up to 2 twice daily We check with Dr Posey Pronto about the Mestinon.

## 2018-01-05 ENCOUNTER — Encounter: Payer: Self-pay | Admitting: Family Medicine

## 2018-01-06 NOTE — Telephone Encounter (Signed)
Spoke with pt and advised of change in medication. He had already read his email. He did schedule f/u ov for 02/01/18. Nothing further needed.

## 2018-01-13 ENCOUNTER — Encounter: Payer: Self-pay | Admitting: Neurology

## 2018-01-13 ENCOUNTER — Telehealth: Payer: Self-pay | Admitting: Neurology

## 2018-01-13 NOTE — Telephone Encounter (Signed)
Called and spoke with Pt. He checked his VM on his cell, and everything is fine now, is scheduled for Friday.

## 2018-01-13 NOTE — Telephone Encounter (Signed)
Patient called and Lmom regarding having his IV Administered and help with scheduling it? Please Call. Thanks

## 2018-01-15 DIAGNOSIS — G7001 Myasthenia gravis with (acute) exacerbation: Secondary | ICD-10-CM | POA: Diagnosis not present

## 2018-01-16 DIAGNOSIS — G7001 Myasthenia gravis with (acute) exacerbation: Secondary | ICD-10-CM | POA: Diagnosis not present

## 2018-01-19 ENCOUNTER — Encounter: Payer: Self-pay | Admitting: Neurology

## 2018-01-19 ENCOUNTER — Ambulatory Visit: Payer: Medicare Other | Admitting: Neurology

## 2018-01-19 VITALS — BP 120/68 | HR 50 | Ht 71.0 in | Wt 230.0 lb

## 2018-01-19 DIAGNOSIS — Z7952 Long term (current) use of systemic steroids: Secondary | ICD-10-CM

## 2018-01-19 DIAGNOSIS — G7001 Myasthenia gravis with (acute) exacerbation: Secondary | ICD-10-CM | POA: Diagnosis not present

## 2018-01-19 MED ORDER — PREDNISONE 20 MG PO TABS: ORAL_TABLET | ORAL | 3 refills | Status: DC

## 2018-01-19 MED ORDER — MYCOPHENOLATE MOFETIL 500 MG PO TABS: 500.0000 mg | ORAL_TABLET | Freq: Two times a day (BID) | ORAL | 5 refills | Status: DC

## 2018-01-19 NOTE — Progress Notes (Signed)
Follow-up Visit   Date: 01/19/18    Rodney Martinez MRN: [id number] DOB: 09/16/1951   Interim History: Rodney Martinez is a 67 y.o. right-handed Caucasian male with OSA, hypothyroidism, and diabetes mellitus returning to the clinic for follow-up of seropositive myasthenia gravis.  The patient was accompanied to the clinic by self.  History of present illness: Starting in mid January 2018, he developed slurred speech, difficulty swallowing, and weakness with moving his tongue. He also complains of droopiness of the eyes, but no double vision.  MRI brain on 1/22 did not show evidence of stroke, but due to progressive symptoms to went to the ER on 1/24. He was recommended to have in-patient evaluation, but choose to undergo out-patient work-up.  There was high clinical suspicion for myasthenia so he was started on mestinon 30mg  three times daily.  A week later, his AChR antibodies returned positive.  He saw my colleague, Dr. Tomi Likens initially who started him on prednisone 20mg  and increased mestinon to 60mg  three times daily and is here to establish care with me.  He has mild improvement with mestinon.  UPDATE 12/17/2017: He is here today with his wife for a follow-up visit.  Despite being on prednisone 40mg  daily, he has not appreciated any improvement in his speech, swallowing, or ptosis. Wife states that his speech gets markedly worse by the evening and it is barely discernable.  His swallowing is improved by the afternoon which is when he is able to eat soft foods.  He is having difficulty with thin liquids such as water.  He does not have any weakness of the arms and legs, shortness of breath, or double vision.  He was started on metformin given elevated blood sugars.   UPDATE 12/31/2017:  He completed IVIG last Wednesday and by Friday, starting having some improvement with swallowing and to a lesser degree speech.  He has not had any choking episodes. He has been able to eat more soft foods,  but is still very cautious with hard textured foods.  He has some improvement with droopy eyelids.  Speech continues to be slurred especially with prolonged talking.  He easily gets fatigued and usually takes a daily nap. No shortness of breath or double vision.   UPDATE 01/19/2018:  He is here for follow-up visit.  He received his maintenance dose on 4/13 and reports having improved speech. His dysphagia is doing much better and he no longer has any choking spells.  He denies double vision and droopy eyelids.  He has reduced his mestinon to 60mg  twice daily, but does not appreciate any marked benefit with it.  His rate rate remains slow around 50-60 bpm.  Medications:  Current Outpatient Medications on File Prior to Visit  Medication Sig Dispense Refill  . ACCU-CHEK FASTCLIX LANCETS MISC Test once daily.  Dx E11.9 102 each 3  . Calcium-Magnesium-Vitamin D (CALCIUM 500 PO) Take by mouth.    Marland Kitchen glucose blood (ACCU-CHEK GUIDE) test strip Test once daily.  Dx e11.9 100 each 3  . levothyroxine (SYNTHROID, LEVOTHROID) 25 MCG tablet Take 1 tablet (25 mcg total) by mouth daily before breakfast. 90 tablet 1  . Melatonin 5 MG CAPS Take 5 mg by mouth at bedtime as needed (for sleep).     . metFORMIN (GLUCOPHAGE) 500 MG tablet Take 1 tablet (500 mg total) by mouth 2 (two) times daily with a meal. 180 tablet 3  . pyridostigmine (MESTINON) 60 MG tablet Take 1 tablet (60 mg total)  by mouth 3 (three) times daily. 270 tablet 3   No current facility-administered medications on file prior to visit.     Allergies: No Known Allergies  Review of Systems:  CONSTITUTIONAL: No fevers, chills, night sweats, +weight loss.  EYES: No visual changes or eye pain ENT: No hearing changes.  No history of nose bleeds.   RESPIRATORY: No cough, wheezing and shortness of breath.   CARDIOVASCULAR: Negative for chest pain, and palpitations.   GI: Negative for abdominal discomfort, blood in stools or black stools.  No recent change  in bowel habits.   GU:  No history of incontinence.   MUSCLOSKELETAL: No history of joint pain or swelling.  No myalgias.   SKIN: Negative for lesions, rash, and itching.   ENDOCRINE: Negative for cold or heat intolerance, polydipsia or goiter.   PSYCH:  No depression or anxiety symptoms.   NEURO: As Above.   Vital Signs:  BP 120/68   Pulse (!) 50   Ht 5\' 11"  (1.803 m)   Wt 230 lb (104.3 kg)   SpO2 98%   BMI 32.08 kg/m    General: Well appearing, comfortable CV:  Bradycardic rate and regular rhythm Pulm:  Clear Ext: no edema   Neurological Exam: MENTAL STATUS including orientation to time, place, person, recent and remote memory, attention span and concentration, language, and fund of knowledge is normal.  Speech is mildly dysarthric, sounds breathy.  CRANIAL NERVES:  Pupils equal round and reactive to light. Normal conjugate, extra-ocular eye movements in all directions of gaze. Mild bilateral ptosis, worse with sustained upgaze. Face is symmetric.  Orbicularis oris and oculi is 5/5; buccinator 5-/5.  Palate elevates symmetrically.  Tongue is midline, tongue movements are slowed, he is able to move the tongue to the corner of the mouth, but unable to tuck into his cheek (stable).  MOTOR:  Motor strength is 5/5 in all extremities, including neck flexion. No fatigability.    COORDINATION/GAIT:    Gait narrow based and stable.   Data: AChR binding 1.01*, blocking 61*, modulating 25*  MRI brain wwo contrast 10/27/2017:  1. No acute intracranial process or abnormal enhancement. 2. Mild parenchymal brain volume loss for age.  IMPRESSION/PLAN: 1.  Seropositive bulbar myasthenia gravis with exacerbation (diagnosed 11/2017), still with slowed tongue movements and mild dysarthria, but overall improved with IVIG.  - Reduce prednisone to 50mg  in May x 2 weeks, then 40mg  daily  - Continue IVIG 1mg /kg every 3 weeks  - Start cellcept 500mg  twice daily.  Risks and benefits discussed.  Check  CBC and CMP in 2 weeks.  Will need to follow LFTs closely as he has baseline transaminitis  - CT chest with contrast to evaluate for thymoma - this was ordered at his last visit, not sure why it has not been scheduled  - OK to use mestinon as needed  2.  Long-term corticosteroid use  - Continue calcium supplementation to maintain 1200mg /d and vitamin D 800 IU/d  - Continue pepcid for GI prophylaxis  3.  Steroid-induced diabetes, appreciate PCP following   Return to clinic in 6 weeks  Greater than 50% of this 25 minute visit was spent in counseling, explanation of diagnosis, planning of further management, and coordination of care.    Thank you for allowing me to participate in patient's care.  If I can answer any additional questions, I would be pleased to do so.    Sincerely,    Donika K. Posey Pronto, DO

## 2018-01-19 NOTE — Patient Instructions (Addendum)
Starting May 1st, reduce prednisone to 50mg  daily x 2 weeks, then reduce to 40mg  daily  Start Cellcept 500mg  twice daily.  While on this medication, we will check your labs again in 2 weeks  CT chest  Take mestinon as needed only  Return to clinic on May 23rd at 3pm.

## 2018-01-20 ENCOUNTER — Telehealth: Payer: Self-pay | Admitting: Neurology

## 2018-01-20 NOTE — Telephone Encounter (Signed)
[employer] called and lmom regarding the medication being rejected and B vs. D at the Pharmacy. Please Call. Thanks

## 2018-01-21 NOTE — Telephone Encounter (Signed)
Called for PA.

## 2018-01-22 ENCOUNTER — Encounter: Payer: Self-pay | Admitting: Neurology

## 2018-02-01 ENCOUNTER — Ambulatory Visit: Payer: Medicare Other | Admitting: Family Medicine

## 2018-02-01 ENCOUNTER — Other Ambulatory Visit: Payer: Self-pay | Admitting: *Deleted

## 2018-02-01 ENCOUNTER — Other Ambulatory Visit: Payer: Self-pay | Admitting: Family Medicine

## 2018-02-01 DIAGNOSIS — G4733 Obstructive sleep apnea (adult) (pediatric): Secondary | ICD-10-CM | POA: Diagnosis not present

## 2018-02-01 DIAGNOSIS — R351 Nocturia: Secondary | ICD-10-CM | POA: Diagnosis not present

## 2018-02-01 DIAGNOSIS — N401 Enlarged prostate with lower urinary tract symptoms: Secondary | ICD-10-CM | POA: Diagnosis not present

## 2018-02-01 DIAGNOSIS — G7001 Myasthenia gravis with (acute) exacerbation: Secondary | ICD-10-CM | POA: Diagnosis not present

## 2018-02-01 DIAGNOSIS — N400 Enlarged prostate without lower urinary tract symptoms: Secondary | ICD-10-CM | POA: Insufficient documentation

## 2018-02-01 DIAGNOSIS — E1165 Type 2 diabetes mellitus with hyperglycemia: Secondary | ICD-10-CM

## 2018-02-01 LAB — CBC WITH DIFFERENTIAL/PLATELET
BASOS ABS: 0 10*3/uL (ref 0.0–0.1)
Basophils Relative: 0.1 % (ref 0.0–3.0)
EOS PCT: 0.1 % (ref 0.0–5.0)
Eosinophils Absolute: 0 10*3/uL (ref 0.0–0.7)
HCT: 39.9 % (ref 39.0–52.0)
HEMOGLOBIN: 13.7 g/dL (ref 13.0–17.0)
LYMPHS ABS: 5.4 10*3/uL — AB (ref 0.7–4.0)
Lymphocytes Relative: 44.7 % (ref 12.0–46.0)
MCHC: 34.3 g/dL (ref 30.0–36.0)
MCV: 98.2 fl (ref 78.0–100.0)
MONO ABS: 0.3 10*3/uL (ref 0.1–1.0)
MONOS PCT: 2.4 % — AB (ref 3.0–12.0)
NEUTROS PCT: 52.7 % (ref 43.0–77.0)
Neutro Abs: 6.4 10*3/uL (ref 1.4–7.7)
Platelets: 127 10*3/uL — ABNORMAL LOW (ref 150.0–400.0)
RBC: 4.06 Mil/uL — AB (ref 4.22–5.81)
RDW: 15.7 % — ABNORMAL HIGH (ref 11.5–15.5)
WBC: 12.1 10*3/uL — AB (ref 4.0–10.5)

## 2018-02-01 LAB — COMPREHENSIVE METABOLIC PANEL
ALK PHOS: 49 U/L (ref 39–117)
ALT: 49 U/L (ref 0–53)
AST: 22 U/L (ref 0–37)
Albumin: 3.7 g/dL (ref 3.5–5.2)
BUN: 27 mg/dL — ABNORMAL HIGH (ref 6–23)
CALCIUM: 8.9 mg/dL (ref 8.4–10.5)
CO2: 28 meq/L (ref 19–32)
CREATININE: 0.76 mg/dL (ref 0.40–1.50)
Chloride: 99 mEq/L (ref 96–112)
GFR: 107.98 mL/min (ref >60.00)
GLUCOSE: 301 mg/dL — AB (ref 70–99)
Potassium: 4.6 mEq/L (ref 3.5–5.1)
Sodium: 134 mEq/L — ABNORMAL LOW (ref 135–145)
TOTAL PROTEIN: 6.4 g/dL (ref 6.0–8.3)
Total Bilirubin: 0.4 mg/dL (ref 0.2–1.2)

## 2018-02-01 LAB — POCT GLYCOSYLATED HEMOGLOBIN (HGB A1C): HEMOGLOBIN A1C: 7.7

## 2018-02-01 MED ORDER — METFORMIN HCL 500 MG PO TABS: ORAL_TABLET | ORAL | 3 refills | Status: DC

## 2018-02-01 MED ORDER — TAMSULOSIN HCL 0.4 MG PO CAPS: 0.4000 mg | ORAL_CAPSULE | Freq: Every day | ORAL | 3 refills | Status: DC

## 2018-02-01 MED ORDER — METFORMIN HCL 500 MG PO TABS: 500.0000 mg | ORAL_TABLET | Freq: Two times a day (BID) | ORAL | 3 refills | Status: DC

## 2018-02-01 NOTE — Patient Instructions (Signed)
Benign Prostatic Hyperplasia  Benign prostatic hyperplasia (BPH) is an enlarged prostate gland that is caused by the normal aging process and not by cancer. The prostate is a walnut-sized gland that is involved in the production of semen. It is located in front of the rectum and below the bladder. The bladder stores urine and the urethra is the tube that carries the urine out of the body. The prostate may get bigger as a man gets older.  An enlarged prostate can press on the urethra. This can make it harder to pass urine. The build-up of urine in the bladder can cause infection. Back pressure and infection may progress to bladder damage and kidney (renal) failure.  What are the causes?  This condition is part of a normal aging process. However, not all men develop problems from this condition. If the prostate enlarges away from the urethra, urine flow will not be blocked. If it enlarges toward the urethra and compresses it, there will be problems passing urine.  What increases the risk?  This condition is more likely to develop in men over the age of 50 years.  What are the signs or symptoms?  Symptoms of this condition include:  · Getting up often during the night to urinate.  · Needing to urinate frequently during the day.  · Difficulty starting urine flow.  · Decrease in size and strength of your urine stream.  · Leaking (dribbling) after urinating.  · Inability to pass urine. This needs immediate treatment.  · Inability to completely empty your bladder.  · Pain when you pass urine. This is more common if there is also an infection.  · Urinary tract infection (UTI).    How is this diagnosed?  This condition is diagnosed based on your medical history, a physical exam, and your symptoms. Tests will also be done, such as:  · A post-void bladder scan. This measures any amount of urine that may remain in your bladder after you finish urinating.  · A digital rectal exam. In a rectal exam, your health care provider  checks your prostate by putting a lubricated, gloved finger into your rectum to feel the back of your prostate gland. This exam detects the size of your gland and any abnormal lumps or growths.  · An exam of your urine (urinalysis).  · A prostate specific antigen (PSA) screening. This is a blood test used to screen for prostate cancer.  · An ultrasound. This test uses sound waves to electronically produce a picture of your prostate gland.    Your health care provider may refer you to a specialist in kidney and prostate diseases (urologist).  How is this treated?  Once symptoms begin, your health care provider will monitor your condition (active surveillance or watchful waiting). Treatment for this condition will depend on the severity of your condition. Treatment may include:  · Observation and yearly exams. This may be the only treatment needed if your condition and symptoms are mild.  · Medicines to relieve your symptoms, including:  ? Medicines to shrink the prostate.  ? Medicines to relax the muscle of the prostate.  · Surgery in severe cases. Surgery may include:  ? Prostatectomy. In this procedure, the prostate tissue is removed completely through an open incision or with a laparascope or robotics.  ? Transurethral resection of the prostate (TURP). In this procedure, a tool is inserted through the opening at the tip of the penis (urethra). It is used to cut away tissue of   the inner core of the prostate. The pieces are removed through the same opening of the penis. This removes the blockage.  ? Transurethral incision (TUIP). In this procedure, small cuts are made in the prostate. This lessens the prostate's pressure on the urethra.  ? Transurethral microwave thermotherapy (TUMT). This procedure uses microwaves to create heat. The heat destroys and removes a small amount of prostate tissue.  ? Transurethral needle ablation (TUNA). This procedure uses radio frequencies to destroy and remove a small amount of  prostate tissue.  ? Interstitial laser coagulation (ILC). This procedure uses a laser to destroy and remove a small amount of prostate tissue.  ? Transurethral electrovaporization (TUVP). This procedure uses electrodes to destroy and remove a small amount of prostate tissue.  ? Prostatic urethral lift. This procedure inserts an implant to push the lobes of the prostate away from the urethra.    Follow these instructions at home:  · Take over-the-counter and prescription medicines only as told by your health care provider.  · Monitor your symptoms for any changes. Contact your health care provider with any changes.  · Avoid drinking large amounts of liquid before going to bed or out in public.  · Avoid or reduce how much caffeine or alcohol you drink.  · Give yourself time when you urinate.  · Keep all follow-up visits as told by your health care provider. This is important.  Contact a health care provider if:  · You have unexplained back pain.  · Your symptoms do not get better with treatment.  · You develop side effects from the medicine you are taking.  · Your urine becomes very dark or has a bad smell.  · Your lower abdomen becomes distended and you have trouble passing your urine.  Get help right away if:  · You have a fever or chills.  · You suddenly cannot urinate.  · You feel lightheaded, or very dizzy, or you faint.  · There are large amounts of blood or clots in the urine.  · Your urinary problems become hard to manage.  · You develop moderate to severe low back or flank pain. The flank is the side of your body between the ribs and the hip.  These symptoms may represent a serious problem that is an emergency. Do not wait to see if the symptoms will go away. Get medical help right away. Call your local emergency services (911 in the [place] Do not drive yourself to the hospital.  Summary  · Benign prostatic hyperplasia (BPH) is an enlarged prostate that is caused by the normal aging process and not by  cancer.  · An enlarged prostate can press on the urethra. This can make it hard to pass urine.  · This condition is part of a normal aging process and is more likely to develop in men over the age of 50 years.  · Get help right away if you suddenly cannot urinate.  This information is not intended to replace advice given to you by your health care provider. Make sure you discuss any questions you have with your health care provider.  Document Released: 09/22/2005 Document Revised: 10/27/2016 Document Reviewed: 10/27/2016  Elsevier Interactive Patient Education © 2018 [employer]

## 2018-02-01 NOTE — Progress Notes (Signed)
  Subjective:     Patient ID: PAX REASONER, male   DOB: 06/22/1951, 67 y.o.   MRN: [id number]  HPI Patient is here for medical follow-up with recent diagnosis of myasthenia gravis. Followed closely by neurology.  He is slowly improving with immunoglobulin therapy. He remains on prednisone 50 mg daily with plans to taper 40 mg daily next week. Recent addition of CellCept. Still has some later date fatigue but overall is improving.  His blood sugars have naturally been elevated with prednisone. Recently started metformin currently up to 500 mg 2 twice daily. He had recent postprandial blood sugar over 300. A1c today 7.7%. This was 7.9% back in January  He is having some nocturia about 3-4 times at night. Occasional slow stream. No burning with urination. No known past history of BPH. Recent PSA 0.33.  He feels he might not be emptying bladder completely.    History of obstructive sleep apnea. Compliant with therapy. He thinks he may need some new equipment. Has previously used [employer]  Past Medical History:  Diagnosis Date  . GERD 12/12/2009  . HYPERGLYCEMIA 12/12/2009  . OBSTRUCTIVE SLEEP APNEA 08/14/2010  . TRANSAMINASES, SERUM, ELEVATED 12/12/2009   Past Surgical History:  Procedure Laterality Date  . BASAL CELL CARCINOMA EXCISION  2007   face (Mohs)  . HERNIA REPAIR  8341   umbilical    reports that he has been smoking cigars.  He has smoked for the past 10.00 years. He has never used smokeless tobacco. He reports that he drinks alcohol. He reports that he does not use drugs. family history includes Diabetes in his maternal grandfather, mother, paternal grandfather, and paternal grandmother; Heart disease in his father. No Known Allergies   Review of Systems  Constitutional: Positive for fatigue. Negative for appetite change and unexpected weight change.  Eyes: Negative for visual disturbance.  Respiratory: Negative for cough, chest tightness and shortness of breath.    Cardiovascular: Negative for chest pain, palpitations and leg swelling.  Genitourinary: Positive for difficulty urinating and frequency.  Neurological: Negative for dizziness, syncope, weakness, light-headedness and headaches.       Objective:   Physical Exam  Constitutional: He appears well-developed and well-nourished.  Neck: Neck supple.  Cardiovascular: Normal rate, regular rhythm and normal heart sounds.  Pulmonary/Chest: Effort normal and breath sounds normal. He has no wheezes. He has no rales.  Musculoskeletal: He exhibits no edema.       Assessment:     #1 type 2 diabetes exacerbated by current prednisone usage with plans to taper back slowly per neurology. A1c today of 7.7% but stable compared with January  #2 obstructive sleep apnea  #3 BPH. Patient describes some nocturia and increased slow stream. Recent PSA stable  #4 myasthenia gravis-improving with combination therapy of prednisone, immunoglobulin, CellCept    Plan:     -Discuss diabetes management options. At this point he is reluctant to add additional medication. Would consider either low-dose sulfonylurea or DPP-4 medication if necessary but suspect A1c's will continue to improve as his prednisone is gradually tapered Reassess in 3 months. Continue close home monitoring  -Discuss trial of Flomax 0.4 mg daily at bedtime for BPH symptoms  -will get someone with [facility] to look at his CPAP equipment  [name] MD [facility]

## 2018-02-02 ENCOUNTER — Encounter: Payer: Self-pay | Admitting: Family Medicine

## 2018-02-02 ENCOUNTER — Ambulatory Visit
Admission: RE | Admit: 2018-02-02 | Discharge: 2018-02-02 | Disposition: A | Discharge status: Home / Self Care | Payer: Medicare Other | Source: Ambulatory Visit | Attending: Neurology | Admitting: Neurology

## 2018-02-02 DIAGNOSIS — R001 Bradycardia, unspecified: Secondary | ICD-10-CM

## 2018-02-02 DIAGNOSIS — Z7952 Long term (current) use of systemic steroids: Secondary | ICD-10-CM

## 2018-02-02 DIAGNOSIS — G7001 Myasthenia gravis with (acute) exacerbation: Secondary | ICD-10-CM

## 2018-02-02 DIAGNOSIS — G7000 Myasthenia gravis without (acute) exacerbation: Secondary | ICD-10-CM | POA: Diagnosis not present

## 2018-02-02 MED ORDER — IOPAMIDOL (ISOVUE-300) INJECTION 61%
75.0000 mL | Freq: Once | INTRAVENOUS | Status: AC | PRN
  Administered 2018-02-02: 75 mL via INTRAVENOUS

## 2018-02-04 ENCOUNTER — Encounter: Payer: Self-pay | Admitting: Family Medicine

## 2018-02-04 ENCOUNTER — Telehealth: Payer: Self-pay | Admitting: *Deleted

## 2018-02-04 NOTE — Telephone Encounter (Signed)
Patient given results

## 2018-02-04 NOTE — Telephone Encounter (Signed)
-----   Message from Alda Berthold, DO sent at 02/03/2018 10:46 AM EDT ----- Please inform Dr. Theador Hawthorne that his CT chest did not show abnormal thymus tissue.  There is a small lung nodule, nonspecific and likely incidental. Thanks.

## 2018-02-09 DIAGNOSIS — G4733 Obstructive sleep apnea (adult) (pediatric): Secondary | ICD-10-CM | POA: Diagnosis not present

## 2018-02-12 ENCOUNTER — Encounter: Payer: Self-pay | Admitting: Neurology

## 2018-02-15 ENCOUNTER — Encounter: Payer: Self-pay | Admitting: Neurology

## 2018-02-25 ENCOUNTER — Other Ambulatory Visit: Payer: Self-pay | Admitting: Neurology

## 2018-02-25 ENCOUNTER — Encounter: Payer: Self-pay | Admitting: Neurology

## 2018-02-25 ENCOUNTER — Ambulatory Visit: Payer: Medicare Other | Admitting: Neurology

## 2018-02-25 VITALS — BP 100/64 | HR 67 | Ht 71.0 in | Wt 226.2 lb

## 2018-02-25 DIAGNOSIS — Z7952 Long term (current) use of systemic steroids: Secondary | ICD-10-CM | POA: Diagnosis not present

## 2018-02-25 DIAGNOSIS — R5381 Other malaise: Secondary | ICD-10-CM

## 2018-02-25 DIAGNOSIS — G7001 Myasthenia gravis with (acute) exacerbation: Secondary | ICD-10-CM

## 2018-02-25 MED ORDER — PYRIDOSTIGMINE BROMIDE 60 MG PO TABS: ORAL_TABLET | ORAL | 5 refills | Status: DC

## 2018-02-25 NOTE — Patient Instructions (Addendum)
Continue prednisone 40mg  daily Continue IVIG 1mg /kg every 3 weeks Continue cellcept 500mg  twice daily.  Check labs Stay well-hydrated Restart mestinon 60mg  twice daily (11am, 5pm)  Return to clinic on June 12th 8am

## 2018-02-25 NOTE — Progress Notes (Signed)
Follow-up Visit   Date: 02/25/18    Rodney Martinez MRN: [id number] DOB: [birth year]   Interim History: Rodney Martinez is a 67 y.o. right-handed Caucasian male with OSA, hypothyroidism, and diabetes mellitus returning to the clinic for follow-up of seropositive myasthenia gravis.  The patient was accompanied to the clinic by self.  History of present illness: Starting in mid January 2018, he developed slurred speech, difficulty swallowing, droopy eyelids, and weakness with moving his tongue. MRI brain on 1/22 did not show evidence of stroke.  There was high clinical suspicion for myasthenia so he was started on mestinon 30mg  three times daily; in the interim, AChR antibodies returned positive.  He saw my colleague, Dr. Tomi Likens initially who started him on prednisone 20mg  and increased mestinon to 60mg  three times daily and is here to establish care with me.  He has mild improvement with mestinon.  At his initial visit with me, there was moderate bulbar weakness.  He was started on prednisone 40mg  daily, he has not appreciated any improvement in his speech, swallowing, or ptosis. Wife states that his speech gets markedly worse by the evening and it is barely discernable.  His swallowing is improved by the afternoon which is when he is able to eat soft foods.  He is having difficulty with thin liquids such as water. IVIG was started in March which improved swallowing.  Speech has not improved.   UPDATE 01/19/2018:  He is here for follow-up visit.  He received his maintenance dose on 4/13 and reports having improved speech. His dysphagia is doing much better and he no longer has any choking spells.  He denies double vision and droopy eyelids.  He has reduced his mestinon to 60mg  twice daily, but does not appreciate any marked benefit with it.  His rate rate remains slow around 50-60 bpm.  UPDATE 02/25/2018:  He is here for follow-up visit.   He reports having IVIG last Friday and has not  appreciated any lasting benefit.  He has noticed worsening speech which is worse in the afternoon and evening.  He has not had problems with swallowing, but feels he was slower in chewing his food last night. No double vision.   He had a fall while clearing some leaves.  He has started to have increased lightheadedness, which is triggered by positional changes such as standing from a squatting position.  He has been on prednisone 40mg  daily since 5/14.   Medications:  Current Outpatient Medications on File Prior to Visit  Medication Sig Dispense Refill  . ACCU-CHEK FASTCLIX LANCETS MISC Test once daily.  Dx E11.9 102 each 3  . Calcium-Magnesium-Vitamin D (CALCIUM 500 PO) Take by mouth.    Marland Kitchen glucose blood (ACCU-CHEK GUIDE) test strip Test once daily.  Dx e11.9 100 each 3  . levothyroxine (SYNTHROID, LEVOTHROID) 25 MCG tablet Take 1 tablet (25 mcg total) by mouth daily before breakfast. 90 tablet 1  . Melatonin 5 MG CAPS Take 5 mg by mouth at bedtime as needed (for sleep).     . metFORMIN (GLUCOPHAGE) 500 MG tablet Take 2 tablets twice daily 360 tablet 3  . mycophenolate (CELLCEPT) 500 MG tablet Take 1 tablet (500 mg total) by mouth 2 (two) times daily. 60 tablet 5  . predniSONE (DELTASONE) 20 MG tablet Take 50mg  for two weeks, then reduce 40mg  daily. 80 tablet 3  . pyridostigmine (MESTINON) 60 MG tablet Take 1 tablet (60 mg total) by mouth 3 (three) times daily. (Patient  taking differently: Take 60 mg by mouth. One tablet twice daily) 270 tablet 3  . tamsulosin (FLOMAX) 0.4 MG CAPS capsule Take 1 capsule (0.4 mg total) by mouth daily. 30 capsule 3   No current facility-administered medications on file prior to visit.     Allergies: No Known Allergies  Review of Systems:  CONSTITUTIONAL: No fevers, chills, night sweats, +weight loss.  EYES: No visual changes or eye pain ENT: No hearing changes.  No history of nose bleeds.   RESPIRATORY: No cough, wheezing and shortness of breath.     CARDIOVASCULAR: Negative for chest pain, and palpitations.   GI: Negative for abdominal discomfort, blood in stools or black stools.  No recent change in bowel habits.   GU:  No history of incontinence.   MUSCLOSKELETAL: No history of joint pain or swelling.  No myalgias.   SKIN: Negative for lesions, rash, and itching.   ENDOCRINE: Negative for cold or heat intolerance, polydipsia or goiter.   PSYCH:  No depression or anxiety symptoms.   NEURO: As Above.   Vital Signs:  BP 100/64   Pulse 67   Ht 5\' 11"  (1.803 m)   Wt 226 lb 4 oz (102.6 kg)   SpO2 97%   BMI 31.56 kg/m    General Medical Exam:   General:  Well appearing, comfortable  Eyes/ENT: see cranial nerve examination.   Neck: No masses appreciated.  Full range of motion without tenderness.  No carotid bruits. Respiratory:  Clear to auscultation, good air entry bilaterally.   Cardiac:  Regular rate and rhythm, no murmur.   Ext:  Multiple ecchymosis  Neurological Exam: MENTAL STATUS including orientation to time, place, person, recent and remote memory, attention span and concentration, language, and fund of knowledge is normal.  Speech is mildly dysarthric, sounds breathy.  CRANIAL NERVES:  Pupils equal round and reactive to light. Normal conjugate, extra-ocular eye movements in all directions of gaze. Mild bilateral ptosis, worse with sustained upgaze. Face is symmetric.  Orbicularis oris and oculi is 5/5; buccinator 5-/5.  Palate elevates symmetrically.  Tongue is midline, tongue movements are slowed, he is able to move the tongue to the corner of the mouth, and able to tuck the tongue into his cheek (improved)  MOTOR:  Motor strength is 5/5 in all extremities, including neck flexion and bilateral hip flexors. No fatigability.    COORDINATION/GAIT:    Gait narrow based and stable. He is able to stand up from a low chair without using arms  Data: AChR binding 1.01*, blocking 61*, modulating 25*  MRI brain wwo contrast  10/27/2017:  1. No acute intracranial process or abnormal enhancement. 2. Mild parenchymal brain volume loss for age.  CT chest w contrast 02/03/2018: No acute chest abnormality. Specifically, no evidence for a mediastinal mass or abnormal thymic tissue. Probable scarring in the left lower lung. Indeterminate 4 mm nodular density in the right middle lobe. No follow-up needed if patient is low-risk. Non-contrast chest CT can be considered in 12 months if patient is high-risk.  IMPRESSION/PLAN: 1.  Seropositive bulbar myasthenia gravis with exacerbation (diagnosed 11/2017), still with slowed tongue movements and mild dysarthria.  Subjective proximal leg weakness may be related to underlying MG, will follow   - Continue prednisone 40mg  daily, will hold on tapering this as he is still symptomatic  - Continue IVIG 1mg /kg every 3 weeks  - Continue cellcept 500mg  twice daily.   - Check CBC with diff, CMP, vitamin B12, vitamin D, TSH  -  Restart mestinon 60mg  twice daily (11am, 5pm)  - Heat precautions discussed as warmer temperature has manifest with psuedoexacerbation of MG  2.  Long-term corticosteroid use  - Continue calcium supplementation to maintain 1200mg /d and vitamin D 800 IU/d  - Continue pepcid for GI prophylaxis  3.  Lightheadedness, encouraged to stay well-hydrated.    Return to clinic in 6 weeks  Greater than 50% of this 25 minute visit was spent in counseling, explanation of diagnosis, planning of further management, and coordination of care.    Thank you for allowing me to participate in patient's care.  If I can answer any additional questions, I would be pleased to do so.    Sincerely,    Donika K. Posey Pronto, DO

## 2018-02-26 LAB — COMPLETE METABOLIC PANEL WITH GFR
AG RATIO: 1.1 (calc) (ref 1.0–2.5)
ALKALINE PHOSPHATASE (APISO): 44 U/L (ref 40–115)
ALT: 34 U/L (ref 9–46)
AST: 20 U/L (ref 10–35)
Albumin: 3.6 g/dL (ref 3.6–5.1)
BILIRUBIN TOTAL: 0.6 mg/dL (ref 0.2–1.2)
BUN/Creatinine Ratio: 35 (calc) — ABNORMAL HIGH (ref 6–22)
BUN: 28 mg/dL — ABNORMAL HIGH (ref 7–25)
CO2: 30 mmol/L (ref 20–32)
Calcium: 8.7 mg/dL (ref 8.6–10.3)
Chloride: 98 mmol/L (ref 98–110)
Creat: 0.8 mg/dL (ref 0.70–1.25)
GFR, EST NON AFRICAN AMERICAN: 91 mL/min/{1.73_m2} (ref >=60)
GFR, Est African American: 106 mL/min/{1.73_m2} (ref >=60)
GLOBULIN: 3.2 g/dL (ref 1.9–3.7)
Glucose, Bld: 343 mg/dL — ABNORMAL HIGH (ref 65–139)
POTASSIUM: 5.3 mmol/L (ref 3.5–5.3)
SODIUM: 134 mmol/L — AB (ref 135–146)
Total Protein: 6.8 g/dL (ref 6.1–8.1)

## 2018-02-26 LAB — CBC WITH DIFFERENTIAL/PLATELET
BASOS PCT: 0.2 %
Basophils Absolute: 16 cells/uL (ref 0–200)
EOS PCT: 0.1 %
Eosinophils Absolute: 8 cells/uL — ABNORMAL LOW (ref 15–500)
HCT: 38.3 % — ABNORMAL LOW (ref 38.5–50.0)
Hemoglobin: 13.2 g/dL (ref 13.2–17.1)
Lymphs Abs: 4001 cells/uL — ABNORMAL HIGH (ref 850–3900)
MCH: 33.6 pg — ABNORMAL HIGH (ref 27.0–33.0)
MCHC: 34.5 g/dL (ref 32.0–36.0)
MCV: 97.5 fL (ref 80.0–100.0)
MONOS PCT: 2.5 %
MPV: 11.1 fL (ref 7.5–12.5)
Neutro Abs: 3872 cells/uL (ref 1500–7800)
Neutrophils Relative %: 47.8 %
PLATELETS: 127 10*3/uL — AB (ref 140–400)
RBC: 3.93 10*6/uL — AB (ref 4.20–5.80)
RDW: 14.3 % (ref 11.0–15.0)
Total Lymphocyte: 49.4 %
WBC mixed population: 203 cells/uL (ref 200–950)
WBC: 8.1 10*3/uL (ref 3.8–10.8)

## 2018-02-26 LAB — VITAMIN B12: VITAMIN B 12: 176 pg/mL — AB (ref 200–1100)

## 2018-02-26 LAB — TSH: TSH: 0.87 mIU/L (ref 0.40–4.50)

## 2018-02-26 LAB — VITAMIN D 25 HYDROXY (VIT D DEFICIENCY, FRACTURES): VIT D 25 HYDROXY: 30 ng/mL (ref 30–100)

## 2018-03-02 ENCOUNTER — Telehealth: Payer: Self-pay | Admitting: *Deleted

## 2018-03-02 NOTE — Telephone Encounter (Signed)
Left message for patient to call me back. 

## 2018-03-02 NOTE — Telephone Encounter (Signed)
-----   Message from Alda Berthold, DO sent at 02/26/2018 10:28 AM EDT ----- Please inform patient that his labs show vitamin B12 deficiency, which may explain his fatigue.  Start Vitamin B12 108mcg IM injection daily x 7 days, weekly x 4 weeks, then monthly thereafter x 1 year.  His labs otherwise are stable, blood sugars are still high so recommend he follow this at home.

## 2018-03-03 ENCOUNTER — Other Ambulatory Visit: Payer: Self-pay | Admitting: *Deleted

## 2018-03-03 ENCOUNTER — Telehealth: Payer: Self-pay | Admitting: *Deleted

## 2018-03-03 ENCOUNTER — Telehealth: Payer: Self-pay | Admitting: Neurology

## 2018-03-03 ENCOUNTER — Ambulatory Visit: Payer: Medicare Other

## 2018-03-03 NOTE — Telephone Encounter (Signed)
Patient given results.  He will come in today to start B12 injections.

## 2018-03-03 NOTE — Telephone Encounter (Signed)
-----   Message from Alda Berthold, DO sent at 02/26/2018 10:28 AM EDT ----- Please inform patient that his labs show vitamin B12 deficiency, which may explain his fatigue.  Start Vitamin B12 1036mcg IM injection daily x 7 days, weekly x 4 weeks, then monthly thereafter x 1 year.  His labs otherwise are stable, blood sugars are still high so recommend he follow this at home.

## 2018-03-03 NOTE — Telephone Encounter (Signed)
Pt left a VM message saying he was returning your call

## 2018-03-03 NOTE — Telephone Encounter (Signed)
See results note. 

## 2018-03-04 ENCOUNTER — Ambulatory Visit (INDEPENDENT_AMBULATORY_CARE_PROVIDER_SITE_OTHER): Payer: Medicare Other | Admitting: *Deleted

## 2018-03-04 ENCOUNTER — Encounter: Payer: Self-pay | Admitting: Neurology

## 2018-03-04 DIAGNOSIS — E538 Deficiency of other specified B group vitamins: Secondary | ICD-10-CM

## 2018-03-04 MED ORDER — CYANOCOBALAMIN 1000 MCG/ML IJ SOLN
1000.0000 ug | Freq: Once | INTRAMUSCULAR | Status: AC
  Administered 2018-03-04: 1000 ug via INTRAMUSCULAR

## 2018-03-05 ENCOUNTER — Ambulatory Visit (INDEPENDENT_AMBULATORY_CARE_PROVIDER_SITE_OTHER): Payer: Medicare Other | Admitting: *Deleted

## 2018-03-05 DIAGNOSIS — E538 Deficiency of other specified B group vitamins: Secondary | ICD-10-CM

## 2018-03-05 MED ORDER — CYANOCOBALAMIN 1000 MCG/ML IJ SOLN
1000.0000 ug | Freq: Once | INTRAMUSCULAR | Status: AC
  Administered 2018-03-05: 1000 ug via INTRAMUSCULAR

## 2018-03-07 ENCOUNTER — Other Ambulatory Visit: Payer: Self-pay | Admitting: Neurology

## 2018-03-08 ENCOUNTER — Ambulatory Visit (INDEPENDENT_AMBULATORY_CARE_PROVIDER_SITE_OTHER): Payer: Medicare Other | Admitting: *Deleted

## 2018-03-08 DIAGNOSIS — E538 Deficiency of other specified B group vitamins: Secondary | ICD-10-CM

## 2018-03-08 MED ORDER — CYANOCOBALAMIN 1000 MCG/ML IJ SOLN
1000.0000 ug | Freq: Once | INTRAMUSCULAR | Status: AC
  Administered 2018-03-08: 1000 ug via INTRAMUSCULAR

## 2018-03-09 ENCOUNTER — Ambulatory Visit (INDEPENDENT_AMBULATORY_CARE_PROVIDER_SITE_OTHER): Payer: Medicare Other | Admitting: *Deleted

## 2018-03-09 DIAGNOSIS — E538 Deficiency of other specified B group vitamins: Secondary | ICD-10-CM | POA: Diagnosis not present

## 2018-03-09 MED ORDER — CYANOCOBALAMIN 1000 MCG/ML IJ SOLN
1000.0000 ug | Freq: Once | INTRAMUSCULAR | Status: AC
  Administered 2018-03-09: 1000 ug via INTRAMUSCULAR

## 2018-03-10 ENCOUNTER — Ambulatory Visit (INDEPENDENT_AMBULATORY_CARE_PROVIDER_SITE_OTHER): Payer: Medicare Other | Admitting: *Deleted

## 2018-03-10 ENCOUNTER — Telehealth: Payer: Self-pay | Admitting: Neurology

## 2018-03-10 DIAGNOSIS — E538 Deficiency of other specified B group vitamins: Secondary | ICD-10-CM | POA: Diagnosis not present

## 2018-03-10 MED ORDER — CYANOCOBALAMIN 1000 MCG/ML IJ SOLN
1000.0000 ug | Freq: Once | INTRAMUSCULAR | Status: AC
  Administered 2018-03-10: 1000 ug via INTRAMUSCULAR

## 2018-03-10 NOTE — Telephone Encounter (Signed)
Patient called in to let you know that [facility] does not have his Prednisone prescription. Please Call. Thanks

## 2018-03-10 NOTE — Telephone Encounter (Signed)
Left message for patient that I called [facility] and for some reason his Rx had been put on hold but they will take it off hold and have it ready in about an hour.

## 2018-03-11 ENCOUNTER — Ambulatory Visit (INDEPENDENT_AMBULATORY_CARE_PROVIDER_SITE_OTHER): Payer: Medicare Other | Admitting: *Deleted

## 2018-03-11 DIAGNOSIS — E538 Deficiency of other specified B group vitamins: Secondary | ICD-10-CM

## 2018-03-11 MED ORDER — CYANOCOBALAMIN 1000 MCG/ML IJ SOLN
1000.0000 ug | Freq: Once | INTRAMUSCULAR | Status: AC
  Administered 2018-03-11: 1000 ug via INTRAMUSCULAR

## 2018-03-12 DIAGNOSIS — G4733 Obstructive sleep apnea (adult) (pediatric): Secondary | ICD-10-CM | POA: Diagnosis not present

## 2018-03-12 DIAGNOSIS — G7001 Myasthenia gravis with (acute) exacerbation: Secondary | ICD-10-CM | POA: Diagnosis not present

## 2018-03-13 DIAGNOSIS — G7001 Myasthenia gravis with (acute) exacerbation: Secondary | ICD-10-CM | POA: Diagnosis not present

## 2018-03-17 ENCOUNTER — Other Ambulatory Visit (INDEPENDENT_AMBULATORY_CARE_PROVIDER_SITE_OTHER): Payer: Medicare Other

## 2018-03-17 ENCOUNTER — Encounter: Payer: Self-pay | Admitting: Neurology

## 2018-03-17 ENCOUNTER — Ambulatory Visit: Payer: Medicare Other | Admitting: Neurology

## 2018-03-17 VITALS — BP 104/68 | HR 64 | Ht 71.0 in | Wt 229.1 lb

## 2018-03-17 DIAGNOSIS — G7000 Myasthenia gravis without (acute) exacerbation: Secondary | ICD-10-CM

## 2018-03-17 DIAGNOSIS — R233 Spontaneous ecchymoses: Secondary | ICD-10-CM

## 2018-03-17 DIAGNOSIS — R58 Hemorrhage, not elsewhere classified: Secondary | ICD-10-CM

## 2018-03-17 DIAGNOSIS — E538 Deficiency of other specified B group vitamins: Secondary | ICD-10-CM

## 2018-03-17 DIAGNOSIS — D696 Thrombocytopenia, unspecified: Secondary | ICD-10-CM

## 2018-03-17 DIAGNOSIS — R238 Other skin changes: Secondary | ICD-10-CM | POA: Diagnosis not present

## 2018-03-17 LAB — CBC WITH DIFFERENTIAL/PLATELET
BASOS ABS: 0 10*3/uL (ref 0.0–0.1)
Basophils Relative: 0.4 % (ref 0.0–3.0)
EOS ABS: 0 10*3/uL (ref 0.0–0.7)
Eosinophils Relative: 0.4 % (ref 0.0–5.0)
HCT: 38.9 % — ABNORMAL LOW (ref 39.0–52.0)
Hemoglobin: 13.5 g/dL (ref 13.0–17.0)
LYMPHS ABS: 4.3 10*3/uL — AB (ref 0.7–4.0)
Lymphocytes Relative: 56.1 % — ABNORMAL HIGH (ref 12.0–46.0)
MCHC: 34.6 g/dL (ref 30.0–36.0)
MCV: 101 fl — AB (ref 78.0–100.0)
Monocytes Absolute: 0.2 10*3/uL (ref 0.1–1.0)
Monocytes Relative: 2.7 % — ABNORMAL LOW (ref 3.0–12.0)
NEUTROS ABS: 3.1 10*3/uL (ref 1.4–7.7)
NEUTROS PCT: 40.4 % — AB (ref 43.0–77.0)
PLATELETS: 127 10*3/uL — AB (ref 150.0–400.0)
RBC: 3.85 Mil/uL — AB (ref 4.22–5.81)
RDW: 14.6 % (ref 11.5–15.5)
WBC: 7.8 10*3/uL (ref 4.0–10.5)

## 2018-03-17 LAB — HEPATIC FUNCTION PANEL
ALBUMIN: 3.5 g/dL (ref 3.5–5.2)
ALK PHOS: 35 U/L — AB (ref 39–117)
ALT: 42 U/L (ref 0–53)
AST: 28 U/L (ref 0–37)
Bilirubin, Direct: 0.1 mg/dL (ref 0.0–0.3)
TOTAL PROTEIN: 7.2 g/dL (ref 6.0–8.3)
Total Bilirubin: 0.5 mg/dL (ref 0.2–1.2)

## 2018-03-17 MED ORDER — CYANOCOBALAMIN 1000 MCG/ML IJ SOLN
1000.0000 ug | Freq: Once | INTRAMUSCULAR | Status: AC
  Administered 2018-03-17: 1000 ug via INTRAMUSCULAR

## 2018-03-17 NOTE — Patient Instructions (Addendum)
Starting next week, reduce your prednisone to 40mg  (even days) alternating with 30mg  (odd days) for 2 weeks. If you are doing well without signs of weakness, reduce to prednisone 30mg  daily  Continue weekly vitamin B12 injections  We will refer you to hematology for your easy bruising and low platelets  Return to clinic in on July 16th at 3pm  [email or web address]     Myasthenia Gravis Myasthenia gravis (MG) means severe weakness. It is a long-term (chronic) condition that causes weakness in the muscles you can control (voluntary muscles). MG can affect any voluntary muscle. The muscles most often affected are the ones that control:  Eye movement.  Facial movements.  Swallowing.  MG is an autoimmune disease, which means that your body's defense system (immune system) attacks healthy parts of your body instead of germs and other things that make you sick. When you have MG, your immune system makes proteins (antibodies) that block the chemical (acetylcholine) your body needs to send nerve signals to your muscles. This causes muscle weakness. What are the causes? The exact cause of MG is unknown. One possible cause is an enlarged thymus gland, which is located under your breastbone. What are the signs or symptoms? The earliest symptom of MG is muscle weakness that gets worse with activity and gets better after rest. Other symptoms of MG may include:  Drooping eyelids.  Double vision.  Loss of facial expression.  Trouble chewing and swallowing.  Slurred speech.  A waddling walk.  Weakness of the arms, hands, and legs.  Trouble breathing is the most dangerous symptom of MG. Sudden and severe difficulty breathing (myasthenic crisis) may require emergency breathing support. This symptom sometimes happens after:  Infection.  Fever.  Drug reaction.  How is this diagnosed? It can be hard to diagnose MG because muscle weakness is a common symptom in many conditions. Your  health care provider will do a physical exam. You may also have tests that will help make a diagnosis. These may include:  A blood test.  A test using the medicine edrophonium. This medicine increases muscle strength by slowing the breakdown of acetylcholine.  Tests to measure nerve conduction to muscle (electromyography).  An imaging study of the chest (CT or MRI).  How is this treated? Treatment can improve muscle strength. Sometimes symptoms of MG go away for a while (remission) and you can stop treatment. Possible treatments include:  Medicine.  Removal of the thymus gland (thymectomy). This may result in a long remission for some people.  Follow these instructions at home:  Take medicines only as directed by your health care provider.  Get plenty of rest to conserve your energy.  Take frequent breaks to rest your eyes.  Maintain a healthy diet and a healthy weight.  Do not use any tobacco products including cigarettes, chewing tobacco, or electronic cigarettes. If you need help quitting, ask your health care provider.  Keep all follow-up visits as directed by your health care provider. This is important. Contact a health care provider if:  Your symptoms get worse after a fever or infection.  You have a reaction to a medicine you are taking.  Your symptoms change or get worse. Get help right away if: You have trouble breathing. This information is not intended to replace advice given to you by your health care provider. Make sure you discuss any questions you have with your health care provider. Document Released: 12/29/2000 Document Revised: 02/28/2016 Document Reviewed: 11/23/2013 Elsevier Interactive Patient Education  2018  [employer].

## 2018-03-17 NOTE — Progress Notes (Signed)
Follow-up Visit   Date: 03/17/18    Rodney Martinez MRN: [id number] DOB: 1950/10/09   Interim History: Rodney Martinez is a 67 y.o. right-handed Caucasian male with OSA, hypothyroidism, and diabetes mellitus returning to the clinic for follow-up of seropositive myasthenia gravis.  The patient was accompanied to the clinic by self.  History of present illness: Starting in mid January 2018, he developed slurred speech, difficulty swallowing, droopy eyelids, and weakness with moving his tongue. MRI brain on 1/22 did not show evidence of stroke.  There was high clinical suspicion for myasthenia so he was started on mestinon 30mg  three times daily; in the interim, AChR antibodies returned positive.  He saw my colleague, Dr. Tomi Likens initially who started him on prednisone 20mg  and increased mestinon to 60mg  three times daily and is here to establish care with me.  He has mild improvement with mestinon.  At his initial visit with me, there was moderate bulbar weakness.  He was started on prednisone 40mg  daily, he has not appreciated any improvement in his speech, swallowing, or ptosis. Wife states that his speech gets markedly worse by the evening and it is barely discernable.  His swallowing is improved by the afternoon which is when he is able to eat soft foods.  He is having difficulty with thin liquids such as water. IVIG was started in March which improved swallowing.  Speech has not improved.   UPDATE 01/19/2018:  He is here for follow-up visit.  He received his maintenance dose on 4/13 and reports having improved speech. His dysphagia is doing much better and he no longer has any choking spells.  He denies double vision and droopy eyelids.  He has reduced his mestinon to 60mg  twice daily, but does not appreciate any marked benefit with it.  His rate rate remains slow around 50-60 bpm.  UPDATE 02/25/2018:  He is here for follow-up visit.   He reports having IVIG last Friday and has not  appreciated any lasting benefit.  He has noticed worsening speech which is worse in the afternoon and evening.  He has not had problems with swallowing, but feels he was slower in chewing his food last night. No double vision.   He had a fall while clearing some leaves.  He has started to have increased lightheadedness, which is triggered by positional changes such as standing from a squatting position.  He has been on prednisone 40mg  daily since 5/14.  UPDATE 03/17/2018:   He is here for follow-up visit.  He reports feeling better over the past few weeks.  He is able to carry conversations much better even in the evening.  His swallowing is doing much better. He was found to have vitamin B12 deficiency and is getting injections for this.  His energy is improved and able to do more tasks throughout the day.  His last two labs indicated low platelet level at 127*.  He continues to have easy bruising but reports this has been present for many years.    Medications:  Current Outpatient Medications on File Prior to Visit  Medication Sig Dispense Refill  . ACCU-CHEK FASTCLIX LANCETS MISC Test once daily.  Dx E11.9 102 each 3  . Calcium-Magnesium-Vitamin D (CALCIUM 500 PO) Take by mouth.    Marland Kitchen glucose blood (ACCU-CHEK GUIDE) test strip Test once daily.  Dx e11.9 100 each 3  . levothyroxine (SYNTHROID, LEVOTHROID) 25 MCG tablet Take 1 tablet (25 mcg total) by mouth daily before breakfast. 90 tablet  1  . Melatonin 5 MG CAPS Take 5 mg by mouth at bedtime as needed (for sleep).     . metFORMIN (GLUCOPHAGE) 500 MG tablet Take 2 tablets twice daily 360 tablet 3  . mycophenolate (CELLCEPT) 500 MG tablet Take 1 tablet (500 mg total) by mouth 2 (two) times daily. 60 tablet 5  . predniSONE (DELTASONE) 20 MG tablet TAKE 2 TABLETS BY MOUTH ONCE DAILY WITH  BREAKFAST 60 tablet 5  . pyridostigmine (MESTINON) 60 MG tablet Take 1 tablet at 11am and 5pm.  Extra dose as needed 75 tablet 5  . tamsulosin (FLOMAX) 0.4 MG CAPS  capsule Take 1 capsule (0.4 mg total) by mouth daily. 30 capsule 3   No current facility-administered medications on file prior to visit.     Allergies: No Known Allergies  Review of Systems:  CONSTITUTIONAL: No fevers, chills, night sweats. EYES: No visual changes or eye pain ENT: No hearing changes.  No history of nose bleeds.   RESPIRATORY: No cough, wheezing and shortness of breath.   CARDIOVASCULAR: Negative for chest pain, and palpitations.   GI: Negative for abdominal discomfort, blood in stools or black stools.  No recent change in bowel habits.   GU:  No history of incontinence.   MUSCLOSKELETAL: No history of joint pain or swelling.  No myalgias.   SKIN: +for lesions, rash, and itching.   ENDOCRINE: Negative for cold or heat intolerance, polydipsia or goiter.   PSYCH:  No depression or anxiety symptoms.   NEURO: As Above.   Vital Signs:  BP 104/68   Pulse 64   Ht 5\' 11"  (1.803 m)   Wt 229 lb 2 oz (103.9 kg)   SpO2 96%   BMI 31.96 kg/m    General Medical Exam:   General:  Well appearing, comfortable  Eyes/ENT: see cranial nerve examination.   Neck: No masses appreciated.  Full range of motion without tenderness.  No carotid bruits. Respiratory:  Clear to auscultation, good air entry bilaterally.   Cardiac:  Regular rate and rhythm, no murmur.   Ext:  Multiple ecchymosis over the arms  Neurological Exam: MENTAL STATUS including orientation to time, place, person, recent and remote memory, attention span and concentration, language, and fund of knowledge is normal.  Speech is much improved, no dysarthria.  CRANIAL NERVES:  Pupils equal round and reactive to light. Normal conjugate, extra-ocular eye movements in all directions of gaze. No ptosis today at rest or with sustained upgaze.  Face is symmetric.  Orbicularis oris and oculi is 5/5; buccinator 5-/5.  Palate elevates symmetrically.  Tongue is midline, tongue movements are mildly slowed, he is able tuck the tongue  into his cheek (improved).  MOTOR:  Motor strength is 5/5 in all extremities, including neck flexion and bilateral hip flexors. No fatigability.    COORDINATION/GAIT:    Gait narrow based and stable. He is able to stand up from a low chair without using arms  Data: AChR binding 1.01*, blocking 61*, modulating 25*  MRI brain wwo contrast 10/27/2017:  1. No acute intracranial process or abnormal enhancement. 2. Mild parenchymal brain volume loss for age.  CT chest w contrast 02/03/2018: No acute chest abnormality. Specifically, no evidence for a mediastinal mass or abnormal thymic tissue. Probable scarring in the left lower lung. Indeterminate 4 mm nodular density in the right middle lobe. No follow-up needed if patient is low-risk. Non-contrast chest CT can be considered in 12 months if patient is high-risk.  Lab Results  Component Value Date   WBC 8.1 02/25/2018   HGB 13.2 02/25/2018   HCT 38.3 (L) 02/25/2018   MCV 97.5 02/25/2018   PLT 127 (L) 02/25/2018   Lab Results  Component Value Date   CREATININE 0.80 02/25/2018   BUN 28 (H) 02/25/2018   NA 134 (L) 02/25/2018   K 5.3 02/25/2018   CL 98 02/25/2018   CO2 30 02/25/2018   Lab Results  Component Value Date   ALT 34 02/25/2018   AST 20 02/25/2018   ALKPHOS 49 02/01/2018   BILITOT 0.6 02/25/2018   Lab Results  Component Value Date   VITAMINB12 176 (L) 02/25/2018     IMPRESSION/PLAN: 1.  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  Speech, ptosis, and tongue strength is doing much better today.  He is on triple therapy for MG with monthly IVIG, prednisone 40mg /d, and cellcept 500mg  BID (started in April 2019).  Ideally, I would like him on a much lower dose of prednisone and now that he has no signs of weakness, I will start slow taper.   - Taper prednisone with alternating 40mg  with 30mg  for two weeks, then reduce to 30mg  daily.   - He was educated on warning signs of MG exacerbation  and instructed to increase prednisone back to 40mg /d, if weakness returns  - Continue IVIG 1mg /ld every 3 weeks  - Continue Cellcept 500mg  twice daily  - Check CBC with diff and LFT  - Heat precautions discussed as warmer temperature has manifest with psuedoexacerbation of MG  - Educational resources for MG provided  2.  Long-term corticosteroid use  - Continue calcium supplementation to maintain 1200mg /d and vitamin D 800 IU/d  - Continue pepcid for GI prophylaxis  3.  Vitamin B12 deficiency  - Continue weekly vitamin B12 injections x 3, then monthly.   4.  Ecchymosis, low platelet. Prednisone can cause skin changes and easy bruising, but with his low platelet count, I want to seek the guidance of hematology to better understand if he is safe to continue Cellcept, as it may also contribute to low platelet count.  He needs to be on a steroid-sparing medication because current requirements of prednisone are fairly high (40mg /d)  - refer to hematology   Return to clinic in 1 month  Greater than 50% of this 30 minute visit was spent in counseling, explanation of diagnosis, planning of further management, and coordination of care.   Thank you for allowing me to participate in patient's care.  If I can answer any additional questions, I would be pleased to do so.    Sincerely,    Deneise Getty K. Posey Pronto, DO

## 2018-03-17 NOTE — Addendum Note (Signed)
Addended by: Chester Holstein on: 03/17/2018 08:58 AM   Modules accepted: Orders

## 2018-03-18 DIAGNOSIS — E119 Type 2 diabetes mellitus without complications: Secondary | ICD-10-CM | POA: Diagnosis not present

## 2018-03-18 LAB — HM DIABETES EYE EXAM

## 2018-03-25 ENCOUNTER — Ambulatory Visit (INDEPENDENT_AMBULATORY_CARE_PROVIDER_SITE_OTHER): Payer: Medicare Other | Admitting: *Deleted

## 2018-03-25 DIAGNOSIS — E538 Deficiency of other specified B group vitamins: Secondary | ICD-10-CM

## 2018-03-25 MED ORDER — CYANOCOBALAMIN 1000 MCG/ML IJ SOLN
1000.0000 ug | Freq: Once | INTRAMUSCULAR | Status: AC
  Administered 2018-03-25: 1000 ug via INTRAMUSCULAR

## 2018-04-01 ENCOUNTER — Ambulatory Visit (INDEPENDENT_AMBULATORY_CARE_PROVIDER_SITE_OTHER): Payer: Medicare Other | Admitting: *Deleted

## 2018-04-01 DIAGNOSIS — E538 Deficiency of other specified B group vitamins: Secondary | ICD-10-CM

## 2018-04-01 MED ORDER — CYANOCOBALAMIN 1000 MCG/ML IJ SOLN
1000.0000 ug | Freq: Once | INTRAMUSCULAR | Status: AC
  Administered 2018-04-01: 1000 ug via INTRAMUSCULAR

## 2018-04-08 ENCOUNTER — Encounter: Payer: Self-pay | Admitting: Neurology

## 2018-04-11 DIAGNOSIS — G4733 Obstructive sleep apnea (adult) (pediatric): Secondary | ICD-10-CM | POA: Diagnosis not present

## 2018-04-14 ENCOUNTER — Telehealth: Payer: Self-pay | Admitting: Family Medicine

## 2018-04-14 MED ORDER — LEVOTHYROXINE SODIUM 25 MCG PO TABS: 25.0000 ug | ORAL_TABLET | Freq: Every day | ORAL | 1 refills | Status: DC

## 2018-04-14 NOTE — Telephone Encounter (Signed)
Copied from [place] [phone]. Topic: Quick Communication - Rx Refill/Question >> Apr 14, 2018 11:54 AM Boyd Kerbs wrote: Medication: levothyroxine (SYNTHROID, LEVOTHROID) 25 MCG tablet Different pharmacy than before  Has the patient contacted their pharmacy? No. (Agent: If no, request that the patient contact the pharmacy for the refill.) (Agent: If yes, when and what did the pharmacy advise?)  Preferred Pharmacy (with phone number or street name):   [place], [place] - [address] [place] [address] Phone: [phone] Fax: [phone]    Agent: Please be advised that RX refills may take up to 3 business days. We ask that you follow-up with your pharmacy.

## 2018-04-15 ENCOUNTER — Ambulatory Visit (INDEPENDENT_AMBULATORY_CARE_PROVIDER_SITE_OTHER): Payer: Medicare Other | Admitting: *Deleted

## 2018-04-15 DIAGNOSIS — E538 Deficiency of other specified B group vitamins: Secondary | ICD-10-CM | POA: Diagnosis not present

## 2018-04-15 MED ORDER — CYANOCOBALAMIN 1000 MCG/ML IJ SOLN
1000.0000 ug | Freq: Once | INTRAMUSCULAR | Status: AC
  Administered 2018-04-15: 1000 ug via INTRAMUSCULAR

## 2018-04-19 ENCOUNTER — Inpatient Hospital Stay (HOSPITAL_COMMUNITY): Payer: Medicare Other

## 2018-04-19 ENCOUNTER — Encounter (HOSPITAL_COMMUNITY): Payer: Self-pay | Admitting: Hematology

## 2018-04-19 ENCOUNTER — Inpatient Hospital Stay (HOSPITAL_COMMUNITY): Payer: Medicare Other | Admitting: Nurse Practitioner

## 2018-04-19 ENCOUNTER — Inpatient Hospital Stay (HOSPITAL_COMMUNITY): Payer: Medicare Other | Attending: Hematology | Admitting: Hematology

## 2018-04-19 ENCOUNTER — Other Ambulatory Visit: Payer: Self-pay

## 2018-04-19 VITALS — BP 108/65 | HR 65 | Temp 97.7°F | Resp 16 | Wt 234.6 lb

## 2018-04-19 DIAGNOSIS — R238 Other skin changes: Secondary | ICD-10-CM | POA: Diagnosis not present

## 2018-04-19 DIAGNOSIS — Z85828 Personal history of other malignant neoplasm of skin: Secondary | ICD-10-CM

## 2018-04-19 DIAGNOSIS — Z806 Family history of leukemia: Secondary | ICD-10-CM | POA: Diagnosis not present

## 2018-04-19 DIAGNOSIS — R233 Spontaneous ecchymoses: Secondary | ICD-10-CM

## 2018-04-19 DIAGNOSIS — G7000 Myasthenia gravis without (acute) exacerbation: Secondary | ICD-10-CM | POA: Diagnosis not present

## 2018-04-19 DIAGNOSIS — Z7984 Long term (current) use of oral hypoglycemic drugs: Secondary | ICD-10-CM

## 2018-04-19 DIAGNOSIS — Z79899 Other long term (current) drug therapy: Secondary | ICD-10-CM

## 2018-04-19 DIAGNOSIS — F17210 Nicotine dependence, cigarettes, uncomplicated: Secondary | ICD-10-CM | POA: Diagnosis not present

## 2018-04-19 DIAGNOSIS — D696 Thrombocytopenia, unspecified: Secondary | ICD-10-CM

## 2018-04-19 DIAGNOSIS — K219 Gastro-esophageal reflux disease without esophagitis: Secondary | ICD-10-CM | POA: Diagnosis not present

## 2018-04-19 DIAGNOSIS — G4730 Sleep apnea, unspecified: Secondary | ICD-10-CM

## 2018-04-19 LAB — CBC WITH DIFFERENTIAL/PLATELET
Basophils Absolute: 0 10*3/uL (ref 0.0–0.1)
Basophils Relative: 0 %
Eosinophils Absolute: 0 10*3/uL (ref 0.0–0.7)
Eosinophils Relative: 0 %
HEMATOCRIT: 41 % (ref 39.0–52.0)
Hemoglobin: 13.7 g/dL (ref 13.0–17.0)
LYMPHS ABS: 3.9 10*3/uL (ref 0.7–4.0)
LYMPHS PCT: 33 %
MCH: 34.3 pg — AB (ref 26.0–34.0)
MCHC: 33.4 g/dL (ref 30.0–36.0)
MCV: 102.5 fL — AB (ref 78.0–100.0)
MONOS PCT: 2 %
Monocytes Absolute: 0.2 10*3/uL (ref 0.1–1.0)
NEUTROS ABS: 7.8 10*3/uL — AB (ref 1.7–7.7)
Neutrophils Relative %: 65 %
Platelets: 178 10*3/uL (ref 150–400)
RBC: 4 MIL/uL — ABNORMAL LOW (ref 4.22–5.81)
RDW: 12.5 % (ref 11.5–15.5)
WBC: 11.9 10*3/uL — ABNORMAL HIGH (ref 4.0–10.5)

## 2018-04-19 LAB — VITAMIN B12: Vitamin B-12: 448 pg/mL (ref 180–914)

## 2018-04-19 LAB — LACTATE DEHYDROGENASE: LDH: 179 U/L (ref 98–192)

## 2018-04-19 LAB — PROTIME-INR
INR: 0.97
PROTHROMBIN TIME: 12.8 s (ref 11.4–15.2)

## 2018-04-19 LAB — FIBRINOGEN: Fibrinogen: 310 mg/dL (ref 210–475)

## 2018-04-19 LAB — FOLATE: Folate: 10.6 ng/mL (ref >5.9)

## 2018-04-19 LAB — APTT: aPTT: 24 seconds (ref 24–36)

## 2018-04-19 NOTE — Progress Notes (Signed)
CONSULT NOTE  Patient Care Team: [name], MD as PCP - General  CHIEF COMPLAINTS/PURPOSE OF CONSULTATION:  Easy bruising and mild thrombocytopenia.  HISTORY OF PRESENTING ILLNESS:  Rodney Martinez 67 y.o. male is seen in consultation today for mild thrombocytopenia with a platelet count of 127 for the past 3 months.  In January of this year, he developed slurred speech with difficulty swallowing.  He lost about 30 to 40 pounds because of that.  Denies any fevers or night sweats.  He was diagnosed with seropositive bulbar myasthenia gravis and was started on monthly IVIG, prednisone 40 mg daily and CellCept 500 mg twice daily starting in April 2019.  His prednisone was recently tapered to 30 mg daily.  His myasthenia symptoms have improved.  He is also receiving B12 injections.  He reports that his skin on his arms has been tending for the last 2 years.  He reports that he has noticed more bruising on the forearms since he was started on prednisone.  He was also started on Mestinon in January.  No recent hospitalizations noted.  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  Family history was significant for maternal aunt with leukemia.  No bleeding disorders in the family.  He reportedly had a surgery in his neck and did not have any additional bleeding.   MEDICAL HISTORY:  Past Medical History:  Diagnosis Date  . GERD 12/12/2009  . HYPERGLYCEMIA 12/12/2009  . OBSTRUCTIVE SLEEP APNEA 08/14/2010  . TRANSAMINASES, SERUM, ELEVATED 12/12/2009    SURGICAL HISTORY: Past Surgical History:  Procedure Laterality Date  . BASAL CELL CARCINOMA EXCISION  2007   face (Mohs)  . HERNIA REPAIR  9233   umbilical    SOCIAL HISTORY: Social History   Socioeconomic History  . Marital status: Married    Spouse name: Not on file  . Number of children: 1  . Years of education: vet school  . Highest education level: Professional school degree (e.g., MD, DDS,  DVM, JD)  Occupational History  . Occupation: retired Armed forces operational officer  . Financial resource strain: Not on file  . Food insecurity:    Worry: Not on file    Inability: Not on file  . Transportation needs:    Medical: Not on file    Non-medical: Not on file  Tobacco Use  . Smoking status: Current Every Day Smoker    Years: 10.00    Types: Cigars  . Smokeless tobacco: Never Used  . Tobacco comment: Pt states that he smokes 6 cigars a day, he smoked cigarettes for 10 years.  Substance and Sexual Activity  . Alcohol use: Yes    Comment: rare  . Drug use: No  . Sexual activity: Not on file  Lifestyle  . Physical activity:    Days per week: Not on file    Minutes per session: Not on file  . Stress: Not on file  Relationships  . Social connections:    Talks on phone: Not on file    Gets together: Not on file    Attends religious service: Not on file    Active member of club or organization: Not on file    Attends meetings of clubs or organizations: Not on file    Relationship status: Not on file  . Intimate partner violence:    Fear of current or ex partner: Not on file    Emotionally abused: Not on file  Physically abused: Not on file    Forced sexual activity: Not on file  Other Topics Concern  . Not on file  Social History Narrative   Lives with wife in a 2 story home.  Has one child. Retired Animal nutritionist.      FAMILY HISTORY: Family History  Problem Relation Age of Onset  . Diabetes Mother        type ll  . Heart disease Father        ?atrial fibrillation  . Diabetes Maternal Grandfather   . Diabetes Paternal Grandmother   . Diabetes Paternal Grandfather   . Colon cancer Neg Hx   . Stomach cancer Neg Hx     ALLERGIES:  has No Known Allergies.  MEDICATIONS:  Current Outpatient Medications  Medication Sig Dispense Refill  . ACCU-CHEK FASTCLIX LANCETS MISC Test once daily.  Dx E11.9 102 each 3  . Calcium-Magnesium-Vitamin D (CALCIUM 500 PO)  Take by mouth.    Marland Kitchen glucose blood (ACCU-CHEK GUIDE) test strip Test once daily.  Dx e11.9 100 each 3  . levothyroxine (SYNTHROID, LEVOTHROID) 25 MCG tablet Take 1 tablet (25 mcg total) by mouth daily before breakfast. 90 tablet 1  . Melatonin 5 MG CAPS Take 5 mg by mouth at bedtime as needed (for sleep).     . metFORMIN (GLUCOPHAGE) 500 MG tablet Take 2 tablets twice daily 360 tablet 3  . mycophenolate (CELLCEPT) 500 MG tablet Take 1 tablet (500 mg total) by mouth 2 (two) times daily. 60 tablet 5  . omeprazole (PRILOSEC) 20 MG capsule Take 20 mg by mouth daily.    . predniSONE (DELTASONE) 20 MG tablet TAKE 2 TABLETS BY MOUTH ONCE DAILY WITH  BREAKFAST 60 tablet 5  . pyridostigmine (MESTINON) 60 MG tablet Take 1 tablet at 11am and 5pm.  Extra dose as needed 75 tablet 5  . tamsulosin (FLOMAX) 0.4 MG CAPS capsule Take 1 capsule (0.4 mg total) by mouth daily. 30 capsule 3   No current facility-administered medications for this visit.     REVIEW OF SYSTEMS:   Constitutional: Denies fevers, chills or abnormal night sweats Eyes: Denies blurriness of vision, double vision or watery eyes Ears, nose, mouth, throat, and face: Denies mucositis or sore throat Respiratory: Denies cough, dyspnea or wheezes Cardiovascular: Denies palpitation, chest discomfort or lower extremity swelling Gastrointestinal:  Denies nausea, heartburn or change in bowel habits Skin: Denies abnormal skin rashes Lymphatics: Denies new lymphadenopathy.  Easy bruising positive. Neurological:Denies numbness, tingling or new weaknesses Behavioral/Psych: Mood is stable, no new changes  All other systems were reviewed with the patient and are negative.  PHYSICAL EXAMINATION: ECOG PERFORMANCE STATUS: 1 - Symptomatic but completely ambulatory  Vitals:   04/19/18 1329  BP: 108/65  Pulse: 65  Resp: 16  Temp: 97.7 F (36.5 C)  SpO2: 98%   Filed Weights   04/19/18 1329  Weight: 234 lb 9.6 oz (106.4 kg)    GENERAL:alert, no  distress and comfortable.  Positive for fatigue. SKIN: skin color, texture, turgor are normal, no rashes or significant lesions EYES: normal, conjunctiva are pink and non-injected, sclera clear OROPHARYNX:no exudate, no erythema and lips, buccal mucosa, and tongue normal  NECK: supple, thyroid normal size, non-tender, without nodularity LYMPH:  no palpable lymphadenopathy in the cervical, axillary or inguinal LUNGS: clear to auscultation and percussion with normal breathing effort HEART: regular rate & rhythm and no murmurs and no lower extremity edema ABDOMEN:abdomen soft, non-tender and normal bowel sounds PSYCH: alert & oriented x  3 with fluent speech   LABORATORY DATA:  I have reviewed the data as listed Recent Results (from the past 2160 hour(s))  POCT glycosylated hemoglobin (Hb A1C)     Status: None   Collection Time: 02/01/18  9:22 AM  Result Value Ref Range   Hemoglobin A1C 7.7   CBC with Differential/Platelet     Status: Abnormal   Collection Time: 02/01/18 10:04 AM  Result Value Ref Range   WBC 12.1 (H) 4.0 - 10.5 K/uL   RBC 4.06 (L) 4.22 - 5.81 Mil/uL   Hemoglobin 13.7 13.0 - 17.0 g/dL   HCT 39.9 39.0 - 52.0 %   MCV 98.2 78.0 - 100.0 fl   MCHC 34.3 30.0 - 36.0 g/dL   RDW 15.7 (H) 11.5 - 15.5 %   Platelets 127.0 (L) 150.0 - 400.0 K/uL   Neutrophils Relative % 52.7 43.0 - 77.0 %   Lymphocytes Relative 44.7 12.0 - 46.0 %   Monocytes Relative 2.4 (L) 3.0 - 12.0 %   Eosinophils Relative 0.1 0.0 - 5.0 %   Basophils Relative 0.1 0.0 - 3.0 %   Neutro Abs 6.4 1.4 - 7.7 K/uL   Lymphs Abs 5.4 (H) 0.7 - 4.0 K/uL   Monocytes Absolute 0.3 0.1 - 1.0 K/uL   Eosinophils Absolute 0.0 0.0 - 0.7 K/uL   Basophils Absolute 0.0 0.0 - 0.1 K/uL  CMP     Status: Abnormal   Collection Time: 02/01/18 10:04 AM  Result Value Ref Range   Sodium 134 (L) 135 - 145 mEq/L   Potassium 4.6 3.5 - 5.1 mEq/L   Chloride 99 96 - 112 mEq/L   CO2 28 19 - 32 mEq/L   Glucose, Bld 301 (H) 70 - 99 mg/dL    BUN 27 (H) 6 - 23 mg/dL   Creatinine, Ser 0.76 0.40 - 1.50 mg/dL   Total Bilirubin 0.4 0.2 - 1.2 mg/dL   Alkaline Phosphatase 49 39 - 117 U/L   AST 22 0 - 37 U/L   ALT 49 0 - 53 U/L   Total Protein 6.4 6.0 - 8.3 g/dL   Albumin 3.7 3.5 - 5.2 g/dL   Calcium 8.9 8.4 - 10.5 mg/dL   GFR 107.98 >60.00 mL/min  COMPLETE METABOLIC PANEL WITH GFR     Status: Abnormal   Collection Time: 02/25/18 12:00 AM  Result Value Ref Range   Glucose, Bld 343 (H) 65 - 139 mg/dL    Comment: .        Non-fasting reference interval .    BUN 28 (H) 7 - 25 mg/dL   Creat 0.80 0.70 - 1.25 mg/dL    Comment: For patients >81 years of age, the reference limit for Creatinine is approximately 13% higher for people identified as African-American. .    GFR, Est Non African American 91 > OR = 60 mL/min/1.52m2   GFR, Est African American 106 > OR = 60 mL/min/1.5m2   BUN/Creatinine Ratio 35 (H) 6 - 22 (calc)   Sodium 134 (L) 135 - 146 mmol/L   Potassium 5.3 3.5 - 5.3 mmol/L   Chloride 98 98 - 110 mmol/L   CO2 30 20 - 32 mmol/L   Calcium 8.7 8.6 - 10.3 mg/dL   Total Protein 6.8 6.1 - 8.1 g/dL   Albumin 3.6 3.6 - 5.1 g/dL   Globulin 3.2 1.9 - 3.7 g/dL (calc)   AG Ratio 1.1 1.0 - 2.5 (calc)   Total Bilirubin 0.6 0.2 - 1.2 mg/dL  Alkaline phosphatase (APISO) 44 40 - 115 U/L   AST 20 10 - 35 U/L   ALT 34 9 - 46 U/L  VITAMIN D 25 Hydroxy (Vit-D Deficiency, Fractures)     Status: None   Collection Time: 02/25/18 12:00 AM  Result Value Ref Range   Vit D, 25-Hydroxy 30 30 - 100 ng/mL    Comment: Vitamin D Status         25-OH Vitamin D: . Deficiency:                    <20 ng/mL Insufficiency:             20 - 29 ng/mL Optimal:                 > or = 30 ng/mL . For 25-OH Vitamin D testing on patients on  D2-supplementation and patients for whom quantitation  of D2 and D3 fractions is required, the QuestAssureD(TM) 25-OH VIT D, (D2,D3), LC/MS/MS is recommended: order  code 906-822-5298 (patients >32yrs). . For  more information on this test, go to: http://education.questdiagnostics.com/faq/FAQ163 (This link is being provided for  informational/educational purposes only.)   TSH     Status: None   Collection Time: 02/25/18 12:00 AM  Result Value Ref Range   TSH 0.87 0.40 - 4.50 mIU/L  CBC with Differential/Platelet     Status: Abnormal   Collection Time: 02/25/18 12:00 AM  Result Value Ref Range   WBC 8.1 3.8 - 10.8 Thousand/uL   RBC 3.93 (L) 4.20 - 5.80 Million/uL   Hemoglobin 13.2 13.2 - 17.1 g/dL   HCT 38.3 (L) 38.5 - 50.0 %   MCV 97.5 80.0 - 100.0 fL   MCH 33.6 (H) 27.0 - 33.0 pg   MCHC 34.5 32.0 - 36.0 g/dL   RDW 14.3 11.0 - 15.0 %   Platelets 127 (L) 140 - 400 Thousand/uL   MPV 11.1 7.5 - 12.5 fL   Neutro Abs 3,872 1,500 - 7,800 cells/uL   Lymphs Abs 4,001 (H) 850 - 3,900 cells/uL   WBC mixed population 203 200 - 950 cells/uL   Eosinophils Absolute 8 (L) 15 - 500 cells/uL   Basophils Absolute 16 0 - 200 cells/uL   Neutrophils Relative % 47.8 %   Total Lymphocyte 49.4 %   Monocytes Relative 2.5 %   Eosinophils Relative 0.1 %   Basophils Relative 0.2 %  Vitamin B12     Status: Abnormal   Collection Time: 02/25/18 12:00 AM  Result Value Ref Range   Vitamin B-12 176 (L) 200 - 1,100 pg/mL  Hepatic function panel     Status: Abnormal   Collection Time: 03/17/18  8:37 AM  Result Value Ref Range   Total Bilirubin 0.5 0.2 - 1.2 mg/dL   Bilirubin, Direct 0.1 0.0 - 0.3 mg/dL   Alkaline Phosphatase 35 (L) 39 - 117 U/L   AST 28 0 - 37 U/L   ALT 42 0 - 53 U/L   Total Protein 7.2 6.0 - 8.3 g/dL   Albumin 3.5 3.5 - 5.2 g/dL  CBC with Differential/Platelet     Status: Abnormal   Collection Time: 03/17/18  8:37 AM  Result Value Ref Range   WBC 7.8 4.0 - 10.5 K/uL   RBC 3.85 (L) 4.22 - 5.81 Mil/uL   Hemoglobin 13.5 13.0 - 17.0 g/dL   HCT 38.9 (L) 39.0 - 52.0 %   MCV 101.0 (H) 78.0 - 100.0 fl   MCHC 34.6 30.0 -  36.0 g/dL   RDW 14.6 11.5 - 15.5 %   Platelets 127.0 (L) 150.0 - 400.0  K/uL   Neutrophils Relative % 40.4 (L) 43.0 - 77.0 %   Lymphocytes Relative 56.1 Repeated and verified X2. (H) 12.0 - 46.0 %   Monocytes Relative 2.7 (L) 3.0 - 12.0 %   Eosinophils Relative 0.4 0.0 - 5.0 %   Basophils Relative 0.4 0.0 - 3.0 %   Neutro Abs 3.1 1.4 - 7.7 K/uL   Lymphs Abs 4.3 (H) 0.7 - 4.0 K/uL   Monocytes Absolute 0.2 0.1 - 1.0 K/uL   Eosinophils Absolute 0.0 0.0 - 0.7 K/uL   Basophils Absolute 0.0 0.0 - 0.1 K/uL  Fibrinogen     Status: None   Collection Time: 04/19/18  2:05 PM  Result Value Ref Range   Fibrinogen 310 210 - 475 mg/dL    Comment: Performed at [facility], [address] [place], [place] [address]  Protime-INR     Status: None   Collection Time: 04/19/18  2:15 PM  Result Value Ref Range   Prothrombin Time 12.8 11.4 - 15.2 seconds   INR 0.97     Comment: Performed at [facility], [address] [place], [place] [address]  APTT     Status: None   Collection Time: 04/19/18  2:15 PM  Result Value Ref Range   aPTT 24 24 - 36 seconds    Comment: Performed at [facility], [address], [place], [place] [address]  CBC with Differential     Status: Abnormal   Collection Time: 04/19/18  2:15 PM  Result Value Ref Range   WBC 11.9 (H) 4.0 - 10.5 K/uL   RBC 4.00 (L) 4.22 - 5.81 MIL/uL   Hemoglobin 13.7 13.0 - 17.0 g/dL   HCT 41.0 39.0 - 52.0 %   MCV 102.5 (H) 78.0 - 100.0 fL   MCH 34.3 (H) 26.0 - 34.0 pg   MCHC 33.4 30.0 - 36.0 g/dL   RDW 12.5 11.5 - 15.5 %   Platelets 178 150 - 400 K/uL   Neutrophils Relative % 65 %   Neutro Abs 7.8 (H) 1.7 - 7.7 K/uL   Lymphocytes Relative 33 %   Lymphs Abs 3.9 0.7 - 4.0 K/uL   Monocytes Relative 2 %   Monocytes Absolute 0.2 0.1 - 1.0 K/uL   Eosinophils Relative 0 %   Eosinophils Absolute 0.0 0.0 - 0.7 K/uL   Basophils Relative 0 %   Basophils Absolute 0.0 0.0 - 0.1 K/uL    Comment: Performed at [facility], [address], [place], [place] [address]  Lactate dehydrogenase     Status: None   Collection Time:  04/19/18  2:15 PM  Result Value Ref Range   LDH 179 98 - 192 U/L    Comment: Performed at [facility], [address], [place], [place] [address]    RADIOGRAPHIC STUDIES: I have personally reviewed the CT scan images dated 02/02/2018 which did not show any evidence of mediastinal mass or abnormal thymic tissue.  Indeterminate 4 mm nodular density in the right middle lobe was seen.  ASSESSMENT & PLAN:  Easy bruising 1.  Easy bruising: -Easy bruising is limited to both forearms.  Patient reports thinning of skin of forearms for the last couple of years, noticed more easy bruising since he started prednisone in March.  No other bleeding episodes noted.  He also reports that his father had similar problem with easy bruising on both forearms.  He reports that his prednisone is currently tapered off to 30 mg daily. - We will do preliminary work-up including PT, PTT and fibrinogen levels.  We will also send a von Willebrand disease panel.  If they are normal we will consider vitamin C supplementation, checking for amyloid.  LFTs does not indicate any sign of liver disease.  No family history of hemophilia.  Patient had prior surgery on his neck without any major bleeding.  2.  Mild thrombocytopenia: - Developed mild thrombocytopenia with platelet count of 127 since April.  This coincides with starting of CellCept.  We will check a hepatitis panel.  He also developed macrocytosis since June.  We will send O97, folic acid levels.  We will repeat a platelet count today and review his smear.     All questions were answered. The patient knows to call the clinic with any problems, questions or concerns.      Derek Jack, MD 04/19/18 6:06 PM

## 2018-04-19 NOTE — Assessment & Plan Note (Signed)
1.  Easy bruising: -Easy bruising is limited to both forearms.  Patient reports thinning of skin of forearms for the last couple of years, noticed more easy bruising since he started prednisone in March.  No other bleeding episodes noted.  He also reports that his father had similar problem with easy bruising on both forearms.  He reports that his prednisone is currently tapered off to 30 mg daily. - We will do preliminary work-up including PT, PTT and fibrinogen levels.  We will also send a von Willebrand disease panel.  If they are normal we will consider vitamin C supplementation, checking for amyloid.  LFTs does not indicate any sign of liver disease.  No family history of hemophilia.  Patient had prior surgery on his neck without any major bleeding.  2.  Mild thrombocytopenia: - Developed mild thrombocytopenia with platelet count of 127 since April.  This coincides with starting of CellCept.  We will check a hepatitis panel.  He also developed macrocytosis since June.  We will send V88, folic acid levels.  We will repeat a platelet count today and review his smear.

## 2018-04-20 ENCOUNTER — Ambulatory Visit: Payer: Medicare Other | Admitting: Neurology

## 2018-04-20 ENCOUNTER — Encounter: Payer: Self-pay | Admitting: Neurology

## 2018-04-20 VITALS — BP 100/70 | HR 75 | Ht 71.0 in | Wt 235.4 lb

## 2018-04-20 DIAGNOSIS — G7000 Myasthenia gravis without (acute) exacerbation: Secondary | ICD-10-CM | POA: Diagnosis not present

## 2018-04-20 DIAGNOSIS — E538 Deficiency of other specified B group vitamins: Secondary | ICD-10-CM

## 2018-04-20 DIAGNOSIS — Z7952 Long term (current) use of systemic steroids: Secondary | ICD-10-CM

## 2018-04-20 LAB — HEPATITIS PANEL, ACUTE
HCV Ab: <0.1 s/co ratio (ref 0.0–0.9)
HEP B S AG: NEGATIVE
Hep A IgM: NEGATIVE
Hep B C IgM: NEGATIVE

## 2018-04-20 LAB — RHEUMATOID FACTOR: Rhuematoid fact SerPl-aCnc: <10 IU/mL (ref 0.0–13.9)

## 2018-04-20 NOTE — Progress Notes (Signed)
Follow-up Visit   Date: 04/20/18    Rodney Martinez MRN: [id number] DOB: April 22, 1951   Interim History: Rodney Martinez is a 67 y.o. right-handed Caucasian male with OSA, hypothyroidism, and diabetes mellitus returning to the clinic for follow-up of seropositive myasthenia gravis.  The patient was accompanied to the clinic by self.  History of present illness: Starting in mid January 2018, he developed slurred speech, difficulty swallowing, droopy eyelids, and weakness with moving his tongue. MRI brain on 1/22 did not show evidence of stroke.  There was high clinical suspicion for myasthenia so he was started on mestinon 30mg  three times daily; in the interim, AChR antibodies returned positive.  He saw my colleague, Dr. Tomi Likens initially who started him on prednisone 20mg  and increased mestinon to 60mg  three times daily and is here to establish care with me. Due to lack of improvement, his prednisone was titrated to 60mg  daily and IVIG was started in March. Within a month, his dysphagia and double vision improved.  In May, slow prednisone taper was started.   UPDATE 03/17/2018:   He is here for follow-up visit.  He reports feeling better over the past few weeks.  He is able to carry conversations much better even in the evening.  His swallowing is doing much better. He was found to have vitamin B12 deficiency and is getting injections for this.  His energy is improved and able to do more tasks throughout the day.  His last two labs indicated low platelet level at 127*.  He continues to have easy bruising but reports this has been present for many years.   UPDATE 04/20/2018:  He is here for 1 month follow-up visit.  He has been able to reduce prednisone down to 30mg  daily without breakthrough weakness.  Sometimes when he skips mestinon 60mg  which he takes twice daily (morning and evening), he has some problems with speech.  He is tolerating IVIG every 3 weeks and feels that adjusting the frequency  has helped.  He was seen by hematology yesterday for easy bruising and has labs pending.  He complains of increased fatigue and inability to play golf due to warmer temperatures.  Fortunately, no new facial weakness, double vision, jaw fatigue, or limb weakness.    Medications:  Current Outpatient Medications on File Prior to Visit  Medication Sig Dispense Refill  . ACCU-CHEK FASTCLIX LANCETS MISC Test once daily.  Dx E11.9 102 each 3  . Calcium-Magnesium-Vitamin D (CALCIUM 500 PO) Take by mouth.    Marland Kitchen glucose blood (ACCU-CHEK GUIDE) test strip Test once daily.  Dx e11.9 100 each 3  . levothyroxine (SYNTHROID, LEVOTHROID) 25 MCG tablet Take 1 tablet (25 mcg total) by mouth daily before breakfast. 90 tablet 1  . Melatonin 5 MG CAPS Take 5 mg by mouth at bedtime as needed (for sleep).     . metFORMIN (GLUCOPHAGE) 500 MG tablet Take 2 tablets twice daily 360 tablet 3  . mycophenolate (CELLCEPT) 500 MG tablet Take 1 tablet (500 mg total) by mouth 2 (two) times daily. 60 tablet 5  . omeprazole (PRILOSEC) 20 MG capsule Take 20 mg by mouth daily.    . predniSONE (DELTASONE) 20 MG tablet TAKE 2 TABLETS BY MOUTH ONCE DAILY WITH  BREAKFAST (Patient taking differently: take 30 mg daily with breakfast) 60 tablet 5  . pyridostigmine (MESTINON) 60 MG tablet Take 1 tablet at 11am and 5pm.  Extra dose as needed 75 tablet 5  . tamsulosin (FLOMAX) 0.4 MG  CAPS capsule Take 1 capsule (0.4 mg total) by mouth daily. 30 capsule 3   No current facility-administered medications on file prior to visit.     Allergies: No Known Allergies  Review of Systems:  CONSTITUTIONAL: No fevers, chills, night sweats. EYES: No visual changes or eye pain ENT: No hearing changes.  No history of nose bleeds.   RESPIRATORY: No cough, wheezing and shortness of breath.   CARDIOVASCULAR: Negative for chest pain, and palpitations.   GI: Negative for abdominal discomfort, blood in stools or black stools.  No recent change in bowel  habits.   GU:  No history of incontinence.   MUSCLOSKELETAL: No history of joint pain or swelling.  No myalgias.   SKIN: +for lesions, rash, and itching.   ENDOCRINE: Negative for cold or heat intolerance, polydipsia or goiter.   PSYCH:  No depression or anxiety symptoms.   NEURO: As Above.   Vital Signs:  BP 100/70   Pulse 75   Ht 5\' 11"  (1.803 m)   Wt 235 lb 6 oz (106.8 kg)   SpO2 97%   BMI 32.83 kg/m    General Medical Exam:   General:  Well appearing, comfortable  Ext:  Multiple ecchymosis over the arms  Neurological Exam: MENTAL STATUS including orientation to time, place, person, recent and remote memory, attention span and concentration, language, and fund of knowledge is normal.  Speech is much improved, no dysarthria.  CRANIAL NERVES:  Pupils equal round and reactive to light. Normal conjugate, extra-ocular eye movements in all directions of gaze. No ptosis at rest or with sustained upgaze.  Face is symmetric.  Orbicularis oris and oculi is 5/5; buccinator 5/5.  Palate elevates symmetrically.  Tongue is midline, tongue strength is 5/5 (improved).  MOTOR:  Motor strength is 5/5 in all extremities, including neck flexion and bilateral hip flexors. No fatigability.    COORDINATION/GAIT:    Gait narrow based and stable.  Data: AChR binding 1.01*, blocking 61*, modulating 25*  MRI brain wwo contrast 10/27/2017:  1. No acute intracranial process or abnormal enhancement. 2. Mild parenchymal brain volume loss for age.  CT chest w contrast 02/03/2018: No acute chest abnormality. Specifically, no evidence for a mediastinal mass or abnormal thymic tissue. Probable scarring in the left lower lung. Indeterminate 4 mm nodular density in the right middle lobe. No follow-up needed if patient is low-risk. Non-contrast chest CT can be considered in 12 months if patient is high-risk.  Lab Results  Component Value Date   WBC 11.9 (H) 04/19/2018   HGB 13.7 04/19/2018   HCT 41.0  04/19/2018   MCV 102.5 (H) 04/19/2018   PLT 178 04/19/2018   Lab Results  Component Value Date   CREATININE 0.80 02/25/2018   BUN 28 (H) 02/25/2018   NA 134 (L) 02/25/2018   K 5.3 02/25/2018   CL 98 02/25/2018   CO2 30 02/25/2018   Lab Results  Component Value Date   ALT 42 03/17/2018   AST 28 03/17/2018   ALKPHOS 35 (L) 03/17/2018   BILITOT 0.5 03/17/2018   Lab Results  Component Value Date   VITAMINB12 448 04/19/2018     IMPRESSION/PLAN: 1.  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  Clinically, he is doing very well and does not have any signs of weakness.   - Taper prednisone with alternating 30mg  with 20mg  for two weeks, then reduce to 20mg  daily.   - If he develops weakness on tapering dose, go back to  taking 30mg  daily  - Continue IVIG 1mg /ld every 3 weeks  - Continue Cellcept 500mg  twice daily  - Continue mestinon 60mg  at 11am and 5pm  - Check LFTs and CBC at next visit  2.  Long-term corticosteroid use  - Continue calcium supplementation to maintain 1200mg /d and vitamin D 800 IU/d  - Continue pepcid for GI prophylaxis  3.  Vitamin B12 deficiency  - Continue monthly injections  4.  Ecchymosis, low platelet. Appreciate ongoing evaluation by hematology.  Return to clinic in 6 weeks  Greater than 50% of this 20 minute visit was spent in counseling, explanation of diagnosis, planning of further management, and coordination of care.   Thank you for allowing me to participate in patient's care.  If I can answer any additional questions, I would be pleased to do so.    Sincerely,    Timiko Offutt K. Posey Pronto, DO

## 2018-04-20 NOTE — Patient Instructions (Addendum)
Adjust prednisone starting on Sunday to 20mg  alternating with 30mg  daily for 2 weeks, then reduce to 20mg  daily. Stay on prednisone 20mg  daily until you see me again.  Return for vitamin B12 injection on or after 8/11  Return to clinic on 9/5 at 3pm

## 2018-04-21 LAB — COAG STUDIES INTERP REPORT

## 2018-04-21 LAB — PATHOLOGIST SMEAR REVIEW

## 2018-04-21 LAB — VON WILLEBRAND PANEL
COAGULATION FACTOR VIII: 134 % (ref 56–140)
RISTOCETIN CO-FACTOR, PLASMA: 211 % — AB (ref 50–200)
Von Willebrand Antigen, Plasma: 268 % — ABNORMAL HIGH (ref 50–200)

## 2018-04-21 LAB — ANTINUCLEAR ANTIBODIES, IFA: ANA Ab, IFA: NEGATIVE

## 2018-04-28 ENCOUNTER — Ambulatory Visit (INDEPENDENT_AMBULATORY_CARE_PROVIDER_SITE_OTHER): Payer: Medicare Other | Admitting: *Deleted

## 2018-04-28 DIAGNOSIS — E538 Deficiency of other specified B group vitamins: Secondary | ICD-10-CM

## 2018-04-28 MED ORDER — CYANOCOBALAMIN 1000 MCG/ML IJ SOLN
1000.0000 ug | Freq: Once | INTRAMUSCULAR | Status: AC
  Administered 2018-04-28: 1000 ug via INTRAMUSCULAR

## 2018-05-03 ENCOUNTER — Telehealth: Payer: Self-pay | Admitting: Neurology

## 2018-05-03 NOTE — Telephone Encounter (Signed)
Patient states that he got a letter from [facility] and the treatments are been denied. He wants to know what to do now

## 2018-05-04 NOTE — Telephone Encounter (Signed)
I sent a message to [place] and she will reach out to patient.

## 2018-05-06 ENCOUNTER — Telehealth: Payer: Self-pay | Admitting: Neurology

## 2018-05-06 NOTE — Telephone Encounter (Signed)
Patient wants to talk to someone about the insurance for his treatments. He states that he has not heard anything back from the phone on Monday he left with our office. I saw where you sent out a message to [place] and she was to contact he patient. I let him know what you had done but he has not heard anything from anyone and he is concerned

## 2018-05-07 NOTE — Telephone Encounter (Signed)
Patient called back and was informed that I have contacted [place] x 2 and someone should be contacting him soon.

## 2018-05-10 ENCOUNTER — Encounter (HOSPITAL_COMMUNITY): Payer: Self-pay | Admitting: Hematology

## 2018-05-10 ENCOUNTER — Inpatient Hospital Stay (HOSPITAL_COMMUNITY): Payer: Medicare Other

## 2018-05-10 ENCOUNTER — Inpatient Hospital Stay (HOSPITAL_COMMUNITY): Payer: Medicare Other | Attending: Hematology | Admitting: Hematology

## 2018-05-10 ENCOUNTER — Other Ambulatory Visit: Payer: Self-pay

## 2018-05-10 VITALS — BP 108/70 | HR 51 | Temp 97.7°F | Resp 18 | Wt 243.4 lb

## 2018-05-10 DIAGNOSIS — R238 Other skin changes: Secondary | ICD-10-CM

## 2018-05-10 DIAGNOSIS — Z7984 Long term (current) use of oral hypoglycemic drugs: Secondary | ICD-10-CM | POA: Diagnosis not present

## 2018-05-10 DIAGNOSIS — D7289 Other specified disorders of white blood cells: Secondary | ICD-10-CM

## 2018-05-10 DIAGNOSIS — R5383 Other fatigue: Secondary | ICD-10-CM

## 2018-05-10 DIAGNOSIS — Z79899 Other long term (current) drug therapy: Secondary | ICD-10-CM | POA: Diagnosis not present

## 2018-05-10 DIAGNOSIS — R197 Diarrhea, unspecified: Secondary | ICD-10-CM

## 2018-05-10 DIAGNOSIS — R888 Abnormal findings in other body fluids and substances: Secondary | ICD-10-CM

## 2018-05-10 DIAGNOSIS — M7989 Other specified soft tissue disorders: Secondary | ICD-10-CM

## 2018-05-10 DIAGNOSIS — R233 Spontaneous ecchymoses: Secondary | ICD-10-CM

## 2018-05-10 DIAGNOSIS — K219 Gastro-esophageal reflux disease without esophagitis: Secondary | ICD-10-CM

## 2018-05-10 DIAGNOSIS — D696 Thrombocytopenia, unspecified: Secondary | ICD-10-CM | POA: Diagnosis not present

## 2018-05-10 DIAGNOSIS — F17210 Nicotine dependence, cigarettes, uncomplicated: Secondary | ICD-10-CM | POA: Diagnosis not present

## 2018-05-10 LAB — COMPREHENSIVE METABOLIC PANEL
ALBUMIN: 3.7 g/dL (ref 3.5–5.0)
ALT: 37 U/L (ref 0–44)
AST: 28 U/L (ref 15–41)
Alkaline Phosphatase: 33 U/L — ABNORMAL LOW (ref 38–126)
Anion gap: 8 (ref 5–15)
BUN: 18 mg/dL (ref 8–23)
CHLORIDE: 102 mmol/L (ref 98–111)
CO2: 28 mmol/L (ref 22–32)
CREATININE: 0.85 mg/dL (ref 0.61–1.24)
Calcium: 9 mg/dL (ref 8.9–10.3)
GFR calc Af Amer: >60 mL/min (ref >60)
GFR calc non Af Amer: >60 mL/min (ref >60)
GLUCOSE: 245 mg/dL — AB (ref 70–99)
Potassium: 4.7 mmol/L (ref 3.5–5.1)
SODIUM: 138 mmol/L (ref 135–145)
Total Bilirubin: 0.8 mg/dL (ref 0.3–1.2)
Total Protein: 7.2 g/dL (ref 6.5–8.1)

## 2018-05-10 LAB — CBC WITH DIFFERENTIAL/PLATELET
Basophils Absolute: 0 10*3/uL (ref 0.0–0.1)
Basophils Relative: 0 %
EOS ABS: 0 10*3/uL (ref 0.0–0.7)
EOS PCT: 0 %
HCT: 40.4 % (ref 39.0–52.0)
Hemoglobin: 13.2 g/dL (ref 13.0–17.0)
LYMPHS ABS: 3.3 10*3/uL (ref 0.7–4.0)
Lymphocytes Relative: 36 %
MCH: 33.8 pg (ref 26.0–34.0)
MCHC: 32.7 g/dL (ref 30.0–36.0)
MCV: 103.6 fL — ABNORMAL HIGH (ref 78.0–100.0)
Monocytes Absolute: 0.3 10*3/uL (ref 0.1–1.0)
Monocytes Relative: 3 %
Neutro Abs: 5.8 10*3/uL (ref 1.7–7.7)
Neutrophils Relative %: 61 %
Platelets: 141 10*3/uL — ABNORMAL LOW (ref 150–400)
RBC: 3.9 MIL/uL — ABNORMAL LOW (ref 4.22–5.81)
RDW: 12.6 % (ref 11.5–15.5)
WBC: 9.4 10*3/uL (ref 4.0–10.5)

## 2018-05-10 NOTE — Patient Instructions (Addendum)
[place] at [facility] Discharge Instructions  Please give [name] a call in a week for results of labs. Follow up with [name] in 4 months with labs. Take VIT C 500 daily   Thank you for choosing [place] at [facility] to provide your oncology and hematology care.  To afford each patient quality time with our provider, please arrive at least 15 minutes before your scheduled appointment time.   If you have a lab appointment with the [facility] please come in thru the  Main Entrance and check in at the main information desk  You need to re-schedule your appointment should you arrive 10 or more minutes late.  We strive to give you quality time with our providers, and arriving late affects you and other patients whose appointments are after yours.  Also, if you no show three or more times for appointments you may be dismissed from the clinic at the providers discretion.     Again, thank you for choosing [facility].  Our hope is that these requests will decrease the amount of time that you wait before being seen by our physicians.       _____________________________________________________________  Should you have questions after your visit to [facility], please contact our office at [phone] between the hours of 8:00 a.m. and 4:30 p.m.  Voicemails left after 4:00 p.m. will not be returned until the following business day.  For prescription refill requests, have your pharmacy contact our office and allow 72 hours.    Cancer Center Support Programs:   > Cancer Support Group  2nd Tuesday of the month 1pm-2pm, Journey Room

## 2018-05-10 NOTE — Assessment & Plan Note (Signed)
1.  Easy bruising: -Easy bruising is limited to both forearms.  Patient reports thinning of skin of forearms for the last couple of years, noticed more easy bruising since he started prednisone in March.  No other bleeding episodes noted.  He also reports that his father had similar problem with easy bruising on both forearms.  He is on tapering dose of prednisone.  He is taking 20 mg alternating with 30 mg daily. - I have done preliminary work-up which showed a normal PT, PTT, and fibrinogen levels.  ANA and rheumatoid factor were negative.  Hepatitis panel was negative.  LDH, folic acid, M08 were within normal limits.  Von Willebrand screen was normal.  No further testing is necessary at this time.  I have recommended him to take vitamin C 500 mg daily.  He was told to wear full sleeve shirts while working in his garden. - His peripheral blood smear revealed atypical lymphocytes.  We will send a flow cytometry testing.  He will call us next week to know the results.  I will see him back in 4 months for follow-up.  2.  Mild thrombocytopenia: - Developed mild thrombocytopenia with platelet count of 127 since April.  This coincides with starting of CellCept.  His platelet count normalized at 178.

## 2018-05-10 NOTE — Progress Notes (Signed)
[place], [place] [address]   CLINIC:  Medical Oncology/Hematology  PCP:  [name], MD [address] [place] [address] [phone]   REASON FOR VISIT:  Follow-up for easy bruising and mild thrombocytopenia   CURRENT THERAPY: Cellcept daily   INTERVAL HISTORY:  Rodney Martinez 67 y.o. male returns for routine follow-up for mild thrombocytopenia. Patient is here today still having the bruising to his arms bilaterally. Patient also has numbness and burning to feet and wrists that are stable now. Patient states he is still taking the cellcept as prescribed with no problems. Patients appetite remains good at 100% and he is maintaining his weight. Patient energy levels are at 75% and does get fatigued from time to time. Patient denies any fevers, chills, or night sweats. Denies any SOB.    REVIEW OF SYSTEMS:  Review of Systems  Constitutional: Positive for fatigue.  HENT:  Negative.   Eyes: Negative.   Respiratory: Negative.   Cardiovascular: Positive for leg swelling.  Gastrointestinal: Positive for diarrhea.  Endocrine: Negative.   Genitourinary: Negative.    Skin: Negative.   Neurological: Positive for dizziness, extremity weakness and numbness.  Hematological: Bruises/bleeds easily.  Psychiatric/Behavioral: Negative.      PAST MEDICAL/SURGICAL HISTORY:  Past Medical History:  Diagnosis Date  . GERD 12/12/2009  . HYPERGLYCEMIA 12/12/2009  . OBSTRUCTIVE SLEEP APNEA 08/14/2010  . TRANSAMINASES, SERUM, ELEVATED 12/12/2009   Past Surgical History:  Procedure Laterality Date  . BASAL CELL CARCINOMA EXCISION  2007   face (Mohs)  . HERNIA REPAIR  4081   umbilical     SOCIAL HISTORY:  Social History   Socioeconomic History  . Marital status: Married    Spouse name: Not on file  . Number of children: 1  . Years of education: vet school  . Highest education level: Professional school degree (e.g., MD, DDS, DVM,  JD)  Occupational History  . Occupation: retired Armed forces operational officer  . Financial resource strain: Not on file  . Food insecurity:    Worry: Not on file    Inability: Not on file  . Transportation needs:    Medical: Not on file    Non-medical: Not on file  Tobacco Use  . Smoking status: Current Every Day Smoker    Years: 10.00    Types: Cigars  . Smokeless tobacco: Never Used  . Tobacco comment: Pt states that he smokes 6 cigars a day, he smoked cigarettes for 10 years.  Substance and Sexual Activity  . Alcohol use: Yes    Comment: rare  . Drug use: No  . Sexual activity: Not on file  Lifestyle  . Physical activity:    Days per week: Not on file    Minutes per session: Not on file  . Stress: Not on file  Relationships  . Social connections:    Talks on phone: Not on file    Gets together: Not on file    Attends religious service: Not on file    Active member of club or organization: Not on file    Attends meetings of clubs or organizations: Not on file    Relationship status: Not on file  . Intimate partner violence:    Fear of current or ex partner: Not on file    Emotionally abused: Not on file    Physically abused: Not on file    Forced sexual activity: Not on file  Other Topics Concern  .  Not on file  Social History Narrative   Lives with wife in a 2 story home.  Has one child. Retired Animal nutritionist.      FAMILY HISTORY:  Family History  Problem Relation Age of Onset  . Diabetes Mother        type ll  . Heart disease Father        ?atrial fibrillation  . Diabetes Maternal Grandfather   . Diabetes Paternal Grandmother   . Diabetes Paternal Grandfather   . Colon cancer Neg Hx   . Stomach cancer Neg Hx     CURRENT MEDICATIONS:  Outpatient Encounter Medications as of 05/10/2018  Medication Sig  . ACCU-CHEK FASTCLIX LANCETS MISC Test once daily.  Dx E11.9  . Calcium-Magnesium-Vitamin D (CALCIUM 500 PO) Take by mouth.  Marland Kitchen glucose blood (ACCU-CHEK  GUIDE) test strip Test once daily.  Dx e11.9  . levothyroxine (SYNTHROID, LEVOTHROID) 25 MCG tablet Take 1 tablet (25 mcg total) by mouth daily before breakfast.  . Melatonin 5 MG CAPS Take 5 mg by mouth at bedtime as needed (for sleep).   . metFORMIN (GLUCOPHAGE) 500 MG tablet Take 2 tablets twice daily  . mycophenolate (CELLCEPT) 500 MG tablet Take 1 tablet (500 mg total) by mouth 2 (two) times daily.  Marland Kitchen omeprazole (PRILOSEC) 20 MG capsule Take 20 mg by mouth daily.  . predniSONE (DELTASONE) 20 MG tablet TAKE 2 TABLETS BY MOUTH ONCE DAILY WITH  BREAKFAST (Patient taking differently: take 30 mg daily with breakfast until this Sunday 05/16/18 Then 20mg  daily)  . pyridostigmine (MESTINON) 60 MG tablet Take 1 tablet at 11am and 5pm.  Extra dose as needed  . tamsulosin (FLOMAX) 0.4 MG CAPS capsule Take 1 capsule (0.4 mg total) by mouth daily.   No facility-administered encounter medications on file as of 05/10/2018.     ALLERGIES:  No Known Allergies   PHYSICAL EXAM:  ECOG Performance status: 1  Vitals:   05/10/18 1059  BP: 108/70  Pulse: (!) 51  Resp: 18  Temp: 97.7 F (36.5 C)  SpO2: 100%   Filed Weights   05/10/18 1059  Weight: 243 lb 6.4 oz (110.4 kg)    Physical Exam Ecchymosis on bilateral forearms present.  Some scabs are present.  LABORATORY DATA:  I have reviewed the labs as listed.  CBC    Component Value Date/Time   WBC 9.4 05/10/2018 1143   RBC 3.90 (L) 05/10/2018 1143   HGB 13.2 05/10/2018 1143   HCT 40.4 05/10/2018 1143   PLT 141 (L) 05/10/2018 1143   MCV 103.6 (H) 05/10/2018 1143   MCH 33.8 05/10/2018 1143   MCHC 32.7 05/10/2018 1143   RDW 12.6 05/10/2018 1143   LYMPHSABS 3.3 05/10/2018 1143   MONOABS 0.3 05/10/2018 1143   EOSABS 0.0 05/10/2018 1143   BASOSABS 0.0 05/10/2018 1143   CMP Latest Ref Rng & Units 05/10/2018 03/17/2018 02/25/2018  Glucose 70 - 99 mg/dL 245(H) - 343(H)  BUN 8 - 23 mg/dL 18 - 28(H)  Creatinine 0.61 - 1.24 mg/dL 0.85 - 0.80    Sodium 135 - 145 mmol/L 138 - 134(L)  Potassium 3.5 - 5.1 mmol/L 4.7 - 5.3  Chloride 98 - 111 mmol/L 102 - 98  CO2 22 - 32 mmol/L 28 - 30  Calcium 8.9 - 10.3 mg/dL 9.0 - 8.7  Total Protein 6.5 - 8.1 g/dL 7.2 7.2 6.8  Total Bilirubin 0.3 - 1.2 mg/dL 0.8 0.5 0.6  Alkaline Phos 38 - 126 U/L 33(L) 35(L) -  AST 15 - 41 U/L 28 28 20   ALT 0 - 44 U/L 37 42 34       ASSESSMENT & PLAN:   Easy bruising 1.  Easy bruising: -Easy bruising is limited to both forearms.  Patient reports thinning of skin of forearms for the last couple of years, noticed more easy bruising since he started prednisone in March.  No other bleeding episodes noted.  He also reports that his father had similar problem with easy bruising on both forearms.  He is on tapering dose of prednisone.  He is taking 20 mg alternating with 30 mg daily. - I have done preliminary work-up which showed a normal PT, PTT, and fibrinogen levels.  ANA and rheumatoid factor were negative.  Hepatitis panel was negative.  LDH, folic acid, L27 were within normal limits.  Von Willebrand screen was normal.  No further testing is necessary at this time.  I have recommended him to take vitamin C 500 mg daily.  He was told to wear full sleeve shirts while working in his garden. - His peripheral blood smear revealed atypical lymphocytes.  We will send a flow cytometry testing.  He will call us next week to know the results.  I will see him back in 4 months for follow-up.  2.  Mild thrombocytopenia: - Developed mild thrombocytopenia with platelet count of 127 since April.  This coincides with starting of CellCept.  His platelet count normalized at 178.      Orders placed this encounter:  Orders Placed This Encounter  Procedures  . CBC with Differential/Platelet  . Comprehensive metabolic panel      Rodney Jack, MD [place] [phone]

## 2018-05-12 DIAGNOSIS — G4733 Obstructive sleep apnea (adult) (pediatric): Secondary | ICD-10-CM | POA: Diagnosis not present

## 2018-05-18 ENCOUNTER — Telehealth: Payer: Self-pay | Admitting: Neurology

## 2018-05-18 ENCOUNTER — Encounter (HOSPITAL_COMMUNITY): Payer: Self-pay

## 2018-05-18 NOTE — Telephone Encounter (Signed)
[employer] calling to notify Dr.Patel that medication gammaplex has been approved from 05/17/18 to 08/17/18. FYI

## 2018-05-28 ENCOUNTER — Ambulatory Visit (INDEPENDENT_AMBULATORY_CARE_PROVIDER_SITE_OTHER): Payer: Medicare Other | Admitting: *Deleted

## 2018-05-28 DIAGNOSIS — E538 Deficiency of other specified B group vitamins: Secondary | ICD-10-CM | POA: Diagnosis not present

## 2018-05-28 MED ORDER — CYANOCOBALAMIN 1000 MCG/ML IJ SOLN
1000.0000 ug | Freq: Once | INTRAMUSCULAR | Status: AC
  Administered 2018-05-28: 1000 ug via INTRAMUSCULAR

## 2018-06-01 ENCOUNTER — Other Ambulatory Visit: Payer: Self-pay | Admitting: Family Medicine

## 2018-06-10 ENCOUNTER — Encounter

## 2018-06-10 ENCOUNTER — Ambulatory Visit (INDEPENDENT_AMBULATORY_CARE_PROVIDER_SITE_OTHER): Payer: Medicare Other | Admitting: Neurology

## 2018-06-10 ENCOUNTER — Encounter: Payer: Self-pay | Admitting: Neurology

## 2018-06-10 VITALS — BP 110/80 | HR 76 | Ht 71.0 in | Wt 247.4 lb

## 2018-06-10 DIAGNOSIS — G7000 Myasthenia gravis without (acute) exacerbation: Secondary | ICD-10-CM

## 2018-06-10 DIAGNOSIS — Z7952 Long term (current) use of systemic steroids: Secondary | ICD-10-CM | POA: Diagnosis not present

## 2018-06-10 MED ORDER — PREDNISONE 20 MG PO TABS: ORAL_TABLET | ORAL | 5 refills | Status: DC

## 2018-06-10 MED ORDER — PREDNISONE 5 MG PO TABS: 5.0000 mg | ORAL_TABLET | Freq: Every day | ORAL | 5 refills | Status: DC

## 2018-06-10 NOTE — Patient Instructions (Addendum)
Continue prednisone 20mg  daily for another 2 weeks, then reduce to 15mg  daily and stay on this dose If you develop weakness, go back to taking 20mg  daily Start to slowly taper IVIG 1mg /kg every 4 weeks Continue Cellcept 500mg  twice daily Continue mestinon 60mg  at 11am and 5pm  Return to clinic October 15th at 3pm

## 2018-06-10 NOTE — Progress Notes (Signed)
Follow-up Visit   Date: 06/10/18    Rodney Martinez MRN: [id number] DOB: [birth year]   Interim History: Rodney Martinez is a 67 y.o. right-handed Caucasian male with OSA, hypothyroidism, and diabetes mellitus returning to the clinic for follow-up of seropositive myasthenia gravis.  The patient was accompanied to the clinic by self.  History of present illness: Starting in mid January 2018, he developed slurred speech, difficulty swallowing, droopy eyelids, and weakness with moving his tongue. MRI brain on 1/22 did not show evidence of stroke.  There was high clinical suspicion for myasthenia so he was started on mestinon 30mg  three times daily; in the interim, AChR antibodies returned positive.  He saw my colleague, Dr. Tomi Likens initially who started him on prednisone 20mg  and increased mestinon to 60mg  three times daily and is here to establish care with me. Due to lack of improvement, his prednisone was titrated to 60mg  daily and IVIG was started in March. Within a month, his dysphagia and double vision improved.  In May, slow prednisone taper was started.   UPDATE 03/17/2018:   He reports feeling better over the past few weeks.  He is able to carry conversations much better even in the evening.  His swallowing is doing much better. He was found to have vitamin B12 deficiency and is getting injections for this.  His energy is improved and able to do more tasks throughout the day.  His last two labs indicated low platelet level at 127*.  He continues to have easy bruising but reports this has been present for many years.   UPDATE 04/20/2018:  He has been able to reduce prednisone down to 30mg  daily without breakthrough weakness.  Sometimes when he skips mestinon 60mg  which he takes twice daily (morning and evening), he has some problems with speech.  He is tolerating IVIG every 3 weeks and feels that adjusting the frequency has helped.  He was seen by hematology yesterday for easy bruising and has  labs pending.  He complains of increased fatigue and inability to play golf due to warmer temperatures.     UPDATE 06/10/2018:  He is here for 6 week follow-up visit.  He was successfully taper down to prednisone 20mg  daily without breakthrough vision changes, dysphagia, or dysarthria.  He continues to get IVIG every 3 weeks and takes mestinon 60mg  around 11am and 5pm.  He has not needed to take extra mestinon.  He is doing quite well and besides not being as active, has no problems with his voice or droopiness of the eyes.  With respect to his IVIG, he is tolerating this well and interested in tapering the frequency.  He received a notification in the mail that he would need to pay $73 for nursing service for IVIG for the last 4 treatments, which we will look into.  Medications:  Current Outpatient Medications on File Prior to Visit  Medication Sig Dispense Refill  . ACCU-CHEK FASTCLIX LANCETS MISC Test once daily.  Dx E11.9 102 each 3  . Calcium-Magnesium-Vitamin D (CALCIUM 500 PO) Take by mouth.    Marland Kitchen glucose blood (ACCU-CHEK GUIDE) test strip Test once daily.  Dx e11.9 100 each 3  . levothyroxine (SYNTHROID, LEVOTHROID) 25 MCG tablet Take 1 tablet (25 mcg total) by mouth daily before breakfast. 90 tablet 1  . Melatonin 5 MG CAPS Take 5 mg by mouth at bedtime as needed (for sleep).     . metFORMIN (GLUCOPHAGE) 500 MG tablet Take 2 tablets twice daily  360 tablet 3  . mycophenolate (CELLCEPT) 500 MG tablet Take 1 tablet (500 mg total) by mouth 2 (two) times daily. 60 tablet 5  . omeprazole (PRILOSEC) 20 MG capsule Take 20 mg by mouth daily.    . predniSONE (DELTASONE) 20 MG tablet TAKE 2 TABLETS BY MOUTH ONCE DAILY WITH  BREAKFAST (Patient taking differently: Take one tablet by mouth daily.) 60 tablet 5  . pyridostigmine (MESTINON) 60 MG tablet Take 1 tablet at 11am and 5pm.  Extra dose as needed 75 tablet 5  . tamsulosin (FLOMAX) 0.4 MG CAPS capsule TAKE 1 CAPSULE BY MOUTH DAILY 90 capsule 0   No  current facility-administered medications on file prior to visit.     Allergies: No Known Allergies  Review of Systems:  CONSTITUTIONAL: No fevers, chills, night sweats. EYES: No visual changes or eye pain ENT: No hearing changes.  No history of nose bleeds.   RESPIRATORY: No cough, wheezing and shortness of breath.   CARDIOVASCULAR: Negative for chest pain, and palpitations.   GI: Negative for abdominal discomfort, blood in stools or black stools.  No recent change in bowel habits.   GU:  No history of incontinence.   MUSCLOSKELETAL: No history of joint pain or swelling.  No myalgias.   SKIN: +for lesions, rash, and itching.   ENDOCRINE: Negative for cold or heat intolerance, polydipsia or goiter.   PSYCH:  No depression or anxiety symptoms.   NEURO: As Above.   Vital Signs:  BP 110/80   Pulse 76   Ht 5\' 11"  (1.803 m)   Wt 247 lb 6 oz (112.2 kg)   SpO2 97%   BMI 34.50 kg/m    General Medical Exam:   General:  Well appearing, comfortable  Eyes/ENT: see cranial nerve examination.   Neck: No masses appreciated.  Full range of motion without tenderness.  No carotid bruits. Respiratory:  Clear to auscultation, good air entry bilaterally.   Cardiac:  Regular rate and rhythm, no murmur.   Ext:  Ecchymosis over the forearms  Neurological Exam: MENTAL STATUS including orientation to time, place, person, recent and remote memory, attention span and concentration, language, and fund of knowledge is normal.  Speech is much improved, no dysarthria.  CRANIAL NERVES:  Pupils equal round and reactive to light. Normal conjugate, extra-ocular eye movements in all directions of gaze. No ptosis at rest or with sustained upgaze.  Face is symmetric.  Orbicularis oris and oculi is 5/5; buccinator 5/5.  Palate elevates symmetrically.  Tongue is midline, tongue strength is 5/5.  Tongue movements are also improved.   MOTOR:  Motor strength is 5/5 throughout.   COORDINATION/GAIT:    Gait narrow  based and stable.  Data: AChR binding 1.01*, blocking 61*, modulating 25*  MRI brain wwo contrast 10/27/2017:  1. No acute intracranial process or abnormal enhancement. 2. Mild parenchymal brain volume loss for age.  CT chest w contrast 02/03/2018: No acute chest abnormality. Specifically, no evidence for a mediastinal mass or abnormal thymic tissue. Probable scarring in the left lower lung. Indeterminate 4 mm nodular density in the right middle lobe. No follow-up needed if patient is low-risk. Non-contrast chest CT can be considered in 12 months if patient is high-risk.  Lab Results  Component Value Date   WBC 9.4 05/10/2018   HGB 13.2 05/10/2018   HCT 40.4 05/10/2018   MCV 103.6 (H) 05/10/2018   PLT 141 (L) 05/10/2018   Lab Results  Component Value Date   CREATININE 0.85 05/10/2018  BUN 18 05/10/2018   NA 138 05/10/2018   K 4.7 05/10/2018   CL 102 05/10/2018   CO2 28 05/10/2018   Lab Results  Component Value Date   ALT 37 05/10/2018   AST 28 05/10/2018   ALKPHOS 33 (L) 05/10/2018   BILITOT 0.8 05/10/2018   Lab Results  Component Value Date   VITAMINB12 448 04/19/2018     IMPRESSION/PLAN: 1.  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  He remains on therapy with slow prednisone taper, IVIG, and Cellcept was started in April 2019.  Clinically he is doing great.   - Continue prednisone 20mg  daily for another 2 weeks, then reduce to 15mg  daily and stay on this dose.   - If he develops weakness, go back to taking 20mg  daily  - Start to taper IVIG 1mg /kg every 4 weeks  - Continue Cellcept 500mg  twice daily  - Continue mestinon 60mg  at 11am and 5pm  - Recent LFTs and CBC reviewed and is stable  2.  Long-term corticosteroid use  - Continue calcium supplementation to maintain 1200mg /d and vitamin D 800 IU/d  - Continue pepcid for GI prophylaxis  3.  Vitamin B12 deficiency  - Continue monthly  injections  4.  Ecchymosis, low platelet. Appreciate ongoing evaluation by hematology.  Return to clinic in 6 weeks   Thank you for allowing me to participate in patient's care.  If I can answer any additional questions, I would be pleased to do so.    Sincerely,    English Tomer K. Posey Pronto, DO

## 2018-06-21 DIAGNOSIS — G7001 Myasthenia gravis with (acute) exacerbation: Secondary | ICD-10-CM | POA: Diagnosis not present

## 2018-06-22 DIAGNOSIS — G7001 Myasthenia gravis with (acute) exacerbation: Secondary | ICD-10-CM | POA: Diagnosis not present

## 2018-06-23 ENCOUNTER — Telehealth: Payer: Self-pay | Admitting: Neurology

## 2018-06-23 NOTE — Telephone Encounter (Signed)
Patient called regarding his Prednisone being called into the pharmacy. Thanks

## 2018-06-24 NOTE — Telephone Encounter (Signed)
Left message for patient to call me back. 

## 2018-07-06 ENCOUNTER — Ambulatory Visit (INDEPENDENT_AMBULATORY_CARE_PROVIDER_SITE_OTHER): Payer: Medicare Other | Admitting: *Deleted

## 2018-07-06 DIAGNOSIS — E538 Deficiency of other specified B group vitamins: Secondary | ICD-10-CM

## 2018-07-06 MED ORDER — CYANOCOBALAMIN 1000 MCG/ML IJ SOLN
1000.0000 ug | Freq: Once | INTRAMUSCULAR | Status: AC
  Administered 2018-07-06: 1000 ug via INTRAMUSCULAR

## 2018-07-19 DIAGNOSIS — G7001 Myasthenia gravis with (acute) exacerbation: Secondary | ICD-10-CM | POA: Diagnosis not present

## 2018-07-20 ENCOUNTER — Encounter: Payer: Self-pay | Admitting: Neurology

## 2018-07-20 ENCOUNTER — Ambulatory Visit: Payer: Medicare Other | Admitting: Neurology

## 2018-07-20 ENCOUNTER — Other Ambulatory Visit (INDEPENDENT_AMBULATORY_CARE_PROVIDER_SITE_OTHER): Payer: Medicare Other

## 2018-07-20 VITALS — BP 102/62 | HR 68 | Ht 71.0 in | Wt 253.0 lb

## 2018-07-20 DIAGNOSIS — G7001 Myasthenia gravis with (acute) exacerbation: Secondary | ICD-10-CM | POA: Diagnosis not present

## 2018-07-20 DIAGNOSIS — G7000 Myasthenia gravis without (acute) exacerbation: Secondary | ICD-10-CM

## 2018-07-20 DIAGNOSIS — Z5181 Encounter for therapeutic drug level monitoring: Secondary | ICD-10-CM

## 2018-07-20 NOTE — Progress Notes (Signed)
Follow-up Visit   Date: 07/20/18    Rodney Martinez MRN: [id number] DOB: 1951/03/27   Interim History: VERBON GIANGREGORIO is a 67 y.o. right-handed Caucasian male with OSA, hypothyroidism, and diabetes mellitus returning to the clinic for follow-up of seropositive myasthenia gravis.  The patient was accompanied to the clinic by self.  History of present illness: Starting in mid January 2018, he developed slurred speech, difficulty swallowing, droopy eyelids, and weakness with moving his tongue. MRI brain on 1/22 did not show evidence of stroke.  There was high clinical suspicion for myasthenia so he was started on mestinon 30mg  three times daily; in the interim, AChR antibodies returned positive.  He saw my colleague, Dr. Tomi Likens initially who started him on prednisone 20mg  and increased mestinon to 60mg  three times daily and is here to establish care with me. Due to lack of improvement, his prednisone was titrated to 60mg  daily and IVIG was started in March. Within a month, his dysphagia and double vision improved.  In May, slow prednisone taper was started.   UPDATE 03/17/2018:   He reports feeling better over the past few weeks.  He is able to carry conversations much better even in the evening.  His swallowing is doing much better. He was found to have vitamin B12 deficiency and is getting injections for this.  His last two labs indicated low platelet level at 127*.    UPDATE 04/20/2018:  He has been able to reduce prednisone down to 30mg  daily without breakthrough weakness.  Sometimes when he skips mestinon 60mg  which he takes twice daily (morning and evening), he has some problems with speech.  He is tolerating IVIG every 3 weeks and feels that adjusting the frequency has helped.  He was seen by hematology for easy bruising and has labs pending.    UPDATE 06/10/2018:   He was successfully taper down to prednisone 20mg  daily.  He continues to get IVIG every 3 weeks and takes mestinon 60mg   around 11am and 5pm.  He has not needed to take extra mestinon.  He is doing quite well and besides not being as active, has no problems with his voice or droopiness of the eyes.   UPDATE 07/20/2018: He is here for 6 week follow-up visit. He is doing well on prednisone 15mg  daily and his last IVIG was 4 weeks apart.  He did not notice any new weakness, double vision, or ptosis.  He also remains on Cellcept 500mg  BID and mestinon 60mg  BID.    Medications:  Current Outpatient Medications on File Prior to Visit  Medication Sig Dispense Refill  . ACCU-CHEK FASTCLIX LANCETS MISC Test once daily.  Dx E11.9 102 each 3  . Calcium-Magnesium-Vitamin D (CALCIUM 500 PO) Take by mouth.    Marland Kitchen glucose blood (ACCU-CHEK GUIDE) test strip Test once daily.  Dx e11.9 100 each 3  . levothyroxine (SYNTHROID, LEVOTHROID) 25 MCG tablet Take 1 tablet (25 mcg total) by mouth daily before breakfast. 90 tablet 1  . Melatonin 5 MG CAPS Take 5 mg by mouth at bedtime as needed (for sleep).     . metFORMIN (GLUCOPHAGE) 500 MG tablet Take 2 tablets twice daily 360 tablet 3  . mycophenolate (CELLCEPT) 500 MG tablet Take 1 tablet (500 mg total) by mouth 2 (two) times daily. 60 tablet 5  . predniSONE (DELTASONE) 5 MG tablet Take 1 tablet (5 mg total) by mouth daily with breakfast. (Patient taking differently: Take 15 mg by mouth daily with breakfast. )  30 tablet 5  . pyridostigmine (MESTINON) 60 MG tablet Take 1 tablet at 11am and 5pm.  Extra dose as needed 75 tablet 5  . tamsulosin (FLOMAX) 0.4 MG CAPS capsule TAKE 1 CAPSULE BY MOUTH DAILY 90 capsule 0  . vitamin C (ASCORBIC ACID) 500 MG tablet Take 500 mg by mouth daily.     No current facility-administered medications on file prior to visit.     Allergies: No Known Allergies  Review of Systems:  CONSTITUTIONAL: No fevers, chills, night sweats. EYES: No visual changes or eye pain ENT: No hearing changes.  No history of nose bleeds.   RESPIRATORY: No cough, wheezing and  shortness of breath.   CARDIOVASCULAR: Negative for chest pain, and palpitations.   GI: Negative for abdominal discomfort, blood in stools or black stools.  No recent change in bowel habits.   GU:  No history of incontinence.   MUSCLOSKELETAL: No history of joint pain or swelling.  No myalgias.   SKIN: +for lesions, rash, and itching.   ENDOCRINE: Negative for cold or heat intolerance, polydipsia or goiter.   PSYCH:  No depression or anxiety symptoms.   NEURO: As Above.   Vital Signs:  BP 102/62   Pulse 68   Ht 5\' 11"  (1.803 m)   Wt 253 lb (114.8 kg)   SpO2 95%   BMI 35.29 kg/m    General Medical Exam:   General:  Well appearing, comfortable  Eyes/ENT: see cranial nerve examination.   Neck:   No carotid bruits. Respiratory:  Clear to auscultation, good air entry bilaterally.   Cardiac:  Regular rate and rhythm, no murmur.   Ext:  No edema  Neurological Exam: MENTAL STATUS including orientation to time, place, person, recent and remote memory, attention span and concentration, language, and fund of knowledge is normal.  Speech is much improved, no dysarthria.  CRANIAL NERVES:  Pupils equal round and reactive to light. Normal conjugate, extra-ocular eye movements in all directions of gaze. No ptosis at rest or with sustained upgaze.  Face is symmetric.  Facial muscles are 5/5 throughout - Orbicularis oris and oculi is 5/5; buccinator 5/5.  Palate elevates symmetrically.  Tongue is midline, tongue strength is 5/5.  Tongue movements are improved.   MOTOR:  Motor strength is 5/5 throughout.   COORDINATION/GAIT:    Gait narrow based and stable.  Data: AChR binding 1.01*, blocking 61*, modulating 25*  MRI brain wwo contrast 10/27/2017:  1. No acute intracranial process or abnormal enhancement. 2. Mild parenchymal brain volume loss for age.  CT chest w contrast 02/03/2018: No acute chest abnormality. Specifically, no evidence for a mediastinal mass or abnormal thymic  tissue. Probable scarring in the left lower lung. Indeterminate 4 mm nodular density in the right middle lobe. No follow-up needed if patient is low-risk. Non-contrast chest CT can be considered in 12 months if patient is high-risk.  Lab Results  Component Value Date   WBC 9.4 05/10/2018   HGB 13.2 05/10/2018   HCT 40.4 05/10/2018   MCV 103.6 (H) 05/10/2018   PLT 141 (L) 05/10/2018   Lab Results  Component Value Date   CREATININE 0.85 05/10/2018   BUN 18 05/10/2018   NA 138 05/10/2018   K 4.7 05/10/2018   CL 102 05/10/2018   CO2 28 05/10/2018    IMPRESSION/PLAN: 1.  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  Clinically doing great without bulbar weakness.  He has been able to taper prednisone to 15mg  daily  and now the goal is to taper off IVIG.  He was started on Cellcept in April 2019.  - Continue prednisone 15mg  daily. If he develops weakness, go back to taking 20mg  daily  - Taper IVIG 1mg /kg to every 4 weeks x 1 then every 6 weeks x 1 and reassess  - Continue Cellcept 500mg  twice daily  - Continue mestinon 60mg  at 11am and 5pm  - Check CBC and CMP  2.  Long-term corticosteroid use  - Continue calcium supplementation to maintain 1200mg /d and vitamin D 800 IU/d  - Continue pepcid for GI prophylaxis  3.  Vitamin B12 deficiency  - Continue monthly injections  Return to clinic in 3 months   Thank you for allowing me to participate in patient's care.  If I can answer any additional questions, I would be pleased to do so.    Sincerely,    Truly Stankiewicz K. Posey Pronto, DO

## 2018-07-20 NOTE — Patient Instructions (Addendum)
Continue prednisone 15mg  daily  Check labs  IVIG will be scheduled during the week of November 18th and again around the end of December/early January  Return to clinic in January

## 2018-07-21 LAB — CBC WITH DIFFERENTIAL/PLATELET
Basophils Absolute: 16 cells/uL (ref 0–200)
Basophils Relative: 0.2 %
EOS PCT: 0.1 %
Eosinophils Absolute: 8 cells/uL — ABNORMAL LOW (ref 15–500)
HCT: 38.5 % (ref 38.5–50.0)
Hemoglobin: 13.2 g/dL (ref 13.2–17.1)
Lymphs Abs: 2526 cells/uL (ref 850–3900)
MCH: 32.8 pg (ref 27.0–33.0)
MCHC: 34.3 g/dL (ref 32.0–36.0)
MCV: 95.5 fL (ref 80.0–100.0)
MONOS PCT: 3.6 %
MPV: 11.4 fL (ref 7.5–12.5)
NEUTROS PCT: 65.3 %
Neutro Abs: 5355 cells/uL (ref 1500–7800)
PLATELETS: 187 10*3/uL (ref 140–400)
RBC: 4.03 10*6/uL — ABNORMAL LOW (ref 4.20–5.80)
RDW: 11.9 % (ref 11.0–15.0)
Total Lymphocyte: 30.8 %
WBC mixed population: 295 cells/uL (ref 200–950)
WBC: 8.2 10*3/uL (ref 3.8–10.8)

## 2018-07-21 LAB — COMPREHENSIVE METABOLIC PANEL
AG RATIO: 0.9 (calc) — AB (ref 1.0–2.5)
ALT: 23 U/L (ref 9–46)
AST: 18 U/L (ref 10–35)
Albumin: 3.9 g/dL (ref 3.6–5.1)
Alkaline phosphatase (APISO): 42 U/L (ref 40–115)
BILIRUBIN TOTAL: 0.3 mg/dL (ref 0.2–1.2)
BUN: 20 mg/dL (ref 7–25)
CO2: 27 mmol/L (ref 20–32)
Calcium: 9.1 mg/dL (ref 8.6–10.3)
Chloride: 104 mmol/L (ref 98–110)
Creat: 0.86 mg/dL (ref 0.70–1.25)
GLUCOSE: 201 mg/dL — AB (ref 65–99)
Globulin: 4.4 g/dL (calc) — ABNORMAL HIGH (ref 1.9–3.7)
Potassium: 5.4 mmol/L — ABNORMAL HIGH (ref 3.5–5.3)
Sodium: 134 mmol/L — ABNORMAL LOW (ref 135–146)
Total Protein: 8.3 g/dL — ABNORMAL HIGH (ref 6.1–8.1)

## 2018-07-22 ENCOUNTER — Other Ambulatory Visit: Payer: Self-pay | Admitting: Neurology

## 2018-08-06 DIAGNOSIS — G4733 Obstructive sleep apnea (adult) (pediatric): Secondary | ICD-10-CM | POA: Diagnosis not present

## 2018-08-13 ENCOUNTER — Telehealth: Payer: Self-pay | Admitting: *Deleted

## 2018-08-13 NOTE — Telephone Encounter (Signed)
[employer] approved through 11-12-18.

## 2018-08-16 ENCOUNTER — Ambulatory Visit (INDEPENDENT_AMBULATORY_CARE_PROVIDER_SITE_OTHER): Payer: Medicare Other | Admitting: *Deleted

## 2018-08-16 DIAGNOSIS — E538 Deficiency of other specified B group vitamins: Secondary | ICD-10-CM | POA: Diagnosis not present

## 2018-08-16 MED ORDER — CYANOCOBALAMIN 1000 MCG/ML IJ SOLN
1000.0000 ug | Freq: Once | INTRAMUSCULAR | Status: AC
  Administered 2018-08-16: 1000 ug via INTRAMUSCULAR

## 2018-08-21 ENCOUNTER — Other Ambulatory Visit: Payer: Self-pay | Admitting: Family Medicine

## 2018-08-23 DIAGNOSIS — G7001 Myasthenia gravis with (acute) exacerbation: Secondary | ICD-10-CM | POA: Diagnosis not present

## 2018-08-24 DIAGNOSIS — G7001 Myasthenia gravis with (acute) exacerbation: Secondary | ICD-10-CM | POA: Diagnosis not present

## 2018-09-06 ENCOUNTER — Other Ambulatory Visit (HOSPITAL_COMMUNITY): Payer: Medicare Other

## 2018-09-13 ENCOUNTER — Ambulatory Visit (HOSPITAL_COMMUNITY): Payer: Medicare Other | Admitting: Hematology

## 2018-09-21 ENCOUNTER — Other Ambulatory Visit: Payer: Self-pay

## 2018-09-21 ENCOUNTER — Ambulatory Visit: Payer: Medicare Other | Admitting: Family Medicine

## 2018-09-21 ENCOUNTER — Encounter: Payer: Self-pay | Admitting: Family Medicine

## 2018-09-21 VITALS — BP 130/62 | HR 68 | Temp 97.6°F | Ht 71.0 in | Wt 246.8 lb

## 2018-09-21 DIAGNOSIS — E1165 Type 2 diabetes mellitus with hyperglycemia: Secondary | ICD-10-CM

## 2018-09-21 DIAGNOSIS — R197 Diarrhea, unspecified: Secondary | ICD-10-CM

## 2018-09-21 DIAGNOSIS — K635 Polyp of colon: Secondary | ICD-10-CM

## 2018-09-21 LAB — CBC WITH DIFFERENTIAL/PLATELET
BASOS ABS: 0 10*3/uL (ref 0.0–0.1)
Basophils Relative: 0.2 % (ref 0.0–3.0)
EOS ABS: 0.1 10*3/uL (ref 0.0–0.7)
EOS PCT: 0.9 % (ref 0.0–5.0)
HCT: 38.1 % — ABNORMAL LOW (ref 39.0–52.0)
Hemoglobin: 13 g/dL (ref 13.0–17.0)
LYMPHS ABS: 3.5 10*3/uL (ref 0.7–4.0)
Lymphocytes Relative: 44.3 % (ref 12.0–46.0)
MCHC: 34 g/dL (ref 30.0–36.0)
MCV: 96.4 fl (ref 78.0–100.0)
MONO ABS: 0.3 10*3/uL (ref 0.1–1.0)
Monocytes Relative: 4.3 % (ref 3.0–12.0)
NEUTROS PCT: 50.3 % (ref 43.0–77.0)
Neutro Abs: 3.9 10*3/uL (ref 1.4–7.7)
Platelets: 163 10*3/uL (ref 150.0–400.0)
RBC: 3.95 Mil/uL — ABNORMAL LOW (ref 4.22–5.81)
RDW: 13.2 % (ref 11.5–15.5)
WBC: 7.8 10*3/uL (ref 4.0–10.5)

## 2018-09-21 LAB — BASIC METABOLIC PANEL
BUN: 10 mg/dL (ref 6–23)
CALCIUM: 9.1 mg/dL (ref 8.4–10.5)
CO2: 30 mEq/L (ref 19–32)
CREATININE: 0.88 mg/dL (ref 0.40–1.50)
Chloride: 103 mEq/L (ref 96–112)
GFR: 91.01 mL/min (ref >60.00)
GLUCOSE: 257 mg/dL — AB (ref 70–99)
Potassium: 4.1 mEq/L (ref 3.5–5.1)
Sodium: 140 mEq/L (ref 135–145)

## 2018-09-21 LAB — IGA: IgA: 95 mg/dL (ref 68–378)

## 2018-09-21 LAB — HEMOGLOBIN A1C: HEMOGLOBIN A1C: 6.1 % (ref 4.6–6.5)

## 2018-09-21 MED ORDER — METFORMIN HCL ER 500 MG PO TB24: ORAL_TABLET | ORAL | 11 refills | Status: DC

## 2018-09-21 NOTE — Patient Instructions (Signed)
Diarrhea, Adult Diarrhea is frequent loose and watery bowel movements. Diarrhea can make you feel weak and cause you to become dehydrated. Dehydration can make you tired and thirsty, cause you to have a dry mouth, and decrease how often you urinate. Diarrhea typically lasts 2-3 days. However, it can last longer if it is a sign of something more serious. It is important to treat your diarrhea as told by your health care provider. Follow these instructions at home: Eating and drinking  Follow these recommendations as told by your health care provider:  Take an oral rehydration solution (ORS). This is a drink that is sold at pharmacies and retail stores.  Drink clear fluids, such as water, ice chips, diluted fruit juice, and low-calorie sports drinks.  Eat bland, easy-to-digest foods in small amounts as you are able. These foods include bananas, applesauce, rice, lean meats, toast, and crackers.  Avoid drinking fluids that contain a lot of sugar or caffeine, such as energy drinks, sports drinks, and soda.  Avoid alcohol.  Avoid spicy or fatty foods.  General instructions  Drink enough fluid to keep your urine clear or pale yellow.  Wash your hands often. If soap and water are not available, use hand sanitizer.  Make sure that all people in your household wash their hands well and often.  Take over-the-counter and prescription medicines only as told by your health care provider.  Rest at home while you recover.  Watch your condition for any changes.  Take a warm bath to relieve any burning or pain from frequent diarrhea episodes.  Keep all follow-up visits as told by your health care provider. This is important. Contact a health care provider if:  You have a fever.  Your diarrhea gets worse.  You have new symptoms.  You cannot keep fluids down.  You feel light-headed or dizzy.  You have a headache  You have muscle cramps. Get help right away if:  You have chest  pain.  You feel extremely weak or you faint.  You have bloody or black stools or stools that look like tar.  You have severe pain, cramping, or bloating in your abdomen.  You have trouble breathing or you are breathing very quickly.  Your heart is beating very quickly.  Your skin feels cold and clammy.  You feel confused.  You have signs of dehydration, such as: ? Dark urine, very little urine, or no urine. ? Cracked lips. ? Dry mouth. ? Sunken eyes. ? Sleepiness. ? Weakness. This information is not intended to replace advice given to you by your health care provider. Make sure you discuss any questions you have with your health care provider. Document Released: 09/12/2002 Document Revised: 01/31/2016 Document Reviewed: 05/29/2015 Elsevier Interactive Patient Education  2018 [employer] Inc[employer]  

## 2018-09-21 NOTE — Progress Notes (Signed)
Subjective:     Patient ID: Rodney Martinez, male   DOB: 12-19-1950, 67 y.o.   MRN: [id number]  HPI Patient seen to evaluate diarrhea.  He states that for several years he has had intermittent chronic diarrhea.  Just recently returned after trip to [place] but he had symptoms prior to that travel.  He did have apparently a course of amoxicillin back in October from dentist but he states that diarrhea did not start until several weeks afterwards.  He has diarrhea that varies from watery to loose.  No bloody diarrhea.  No appetite or weight changes.  Last colonoscopy 2013 with recommended 3-year follow-up but never went.  Patient has question whether he may have some lactose intolerance in the past.  No known family history of celiac disease.  He does take metformin 500 mg 2 tablets twice daily.  He has myasthenia gravis and remains on prednisone 50 mg daily and had to go back on metformin after starting the prednisone.  Denies any recent travels outside of country.  Noted that certain foods like fatty foods tend to exacerbate his diarrhea.  Past Medical History:  Diagnosis Date  . GERD 12/12/2009  . HYPERGLYCEMIA 12/12/2009  . OBSTRUCTIVE SLEEP APNEA 08/14/2010  . TRANSAMINASES, SERUM, ELEVATED 12/12/2009   Past Surgical History:  Procedure Laterality Date  . BASAL CELL CARCINOMA EXCISION  2007   face (Mohs)  . HERNIA REPAIR  3151   umbilical    reports that he has been smoking cigars. He has smoked for the past 10.00 years. He has never used smokeless tobacco. He reports current alcohol use. He reports that he does not use drugs. family history includes Diabetes in his maternal grandfather, mother, paternal grandfather, and paternal grandmother; Heart disease in his father. No Known Allergies   Review of Systems  Constitutional: Negative for appetite change, chills, fever and unexpected weight change.  Respiratory: Negative for shortness of breath.   Gastrointestinal: Positive for diarrhea.  Negative for abdominal distention, abdominal pain, blood in stool, nausea and vomiting.  Neurological: Negative for dizziness and weakness.       Objective:   Physical Exam Constitutional:      Appearance: Normal appearance.  Cardiovascular:     Rate and Rhythm: Normal rate and regular rhythm.  Pulmonary:     Effort: Pulmonary effort is normal.     Breath sounds: Normal breath sounds.  Abdominal:     Palpations: Abdomen is soft. There is no mass.     Tenderness: There is no abdominal tenderness.  Neurological:     Mental Status: He is alert.        Assessment:     Patient presents with a longstanding history of chronic intermittent diarrhea.  Overdue for repeat colonoscopy.  Did have one course of antibiotics back in October but diarrhea symptoms were several weeks after that.  Questionable history of lactose intolerance previously. ? Metformin intolerance.   No red flags such as weight loss, fever, bloody stools    Plan:     -Lactose avoidance as much as possible -Check tissue transglutaminase and total IgA level -Patient needs repeat colonoscopy and this will be set up -Avoid high fat and high sugar foods as much as possible -Change immediate release metformin to metformin XR 500 mg twice daily -Check other labs with CBC, chemistries -Low threshold to check stool studies especially C. difficile PCR if diarrhea not improving with change of metformin  [name] MD [facility]

## 2018-09-22 ENCOUNTER — Other Ambulatory Visit: Payer: Self-pay | Admitting: Dermatology

## 2018-09-22 ENCOUNTER — Ambulatory Visit (INDEPENDENT_AMBULATORY_CARE_PROVIDER_SITE_OTHER): Payer: Medicare Other | Admitting: *Deleted

## 2018-09-22 DIAGNOSIS — D692 Other nonthrombocytopenic purpura: Secondary | ICD-10-CM | POA: Diagnosis not present

## 2018-09-22 DIAGNOSIS — D04121 Carcinoma in situ of skin of left upper eyelid, including canthus: Secondary | ICD-10-CM | POA: Diagnosis not present

## 2018-09-22 DIAGNOSIS — E538 Deficiency of other specified B group vitamins: Secondary | ICD-10-CM | POA: Diagnosis not present

## 2018-09-22 DIAGNOSIS — D0439 Carcinoma in situ of skin of other parts of face: Secondary | ICD-10-CM | POA: Diagnosis not present

## 2018-09-22 DIAGNOSIS — L720 Epidermal cyst: Secondary | ICD-10-CM | POA: Diagnosis not present

## 2018-09-22 LAB — TISSUE TRANSGLUTAMINASE, IGA: (TTG) AB, IGA: 1 U/mL

## 2018-09-22 MED ORDER — CYANOCOBALAMIN 1000 MCG/ML IJ SOLN
1000.0000 ug | Freq: Once | INTRAMUSCULAR | Status: AC
  Administered 2018-09-22: 1000 ug via INTRAMUSCULAR

## 2018-09-25 ENCOUNTER — Encounter: Payer: Self-pay | Admitting: Family Medicine

## 2018-09-27 DIAGNOSIS — G7001 Myasthenia gravis with (acute) exacerbation: Secondary | ICD-10-CM | POA: Diagnosis not present

## 2018-09-28 DIAGNOSIS — G7001 Myasthenia gravis with (acute) exacerbation: Secondary | ICD-10-CM | POA: Diagnosis not present

## 2018-10-11 ENCOUNTER — Other Ambulatory Visit (HOSPITAL_COMMUNITY): Payer: Self-pay | Admitting: *Deleted

## 2018-10-11 DIAGNOSIS — D696 Thrombocytopenia, unspecified: Secondary | ICD-10-CM

## 2018-10-12 ENCOUNTER — Inpatient Hospital Stay (HOSPITAL_COMMUNITY): Payer: Medicare Other | Attending: Hematology

## 2018-10-12 DIAGNOSIS — Z85828 Personal history of other malignant neoplasm of skin: Secondary | ICD-10-CM | POA: Insufficient documentation

## 2018-10-12 DIAGNOSIS — K219 Gastro-esophageal reflux disease without esophagitis: Secondary | ICD-10-CM | POA: Diagnosis not present

## 2018-10-12 DIAGNOSIS — D72820 Lymphocytosis (symptomatic): Secondary | ICD-10-CM | POA: Insufficient documentation

## 2018-10-12 DIAGNOSIS — R739 Hyperglycemia, unspecified: Secondary | ICD-10-CM | POA: Diagnosis not present

## 2018-10-12 DIAGNOSIS — R61 Generalized hyperhidrosis: Secondary | ICD-10-CM | POA: Insufficient documentation

## 2018-10-12 DIAGNOSIS — R238 Other skin changes: Secondary | ICD-10-CM | POA: Insufficient documentation

## 2018-10-12 DIAGNOSIS — F17210 Nicotine dependence, cigarettes, uncomplicated: Secondary | ICD-10-CM | POA: Insufficient documentation

## 2018-10-12 DIAGNOSIS — Z7984 Long term (current) use of oral hypoglycemic drugs: Secondary | ICD-10-CM | POA: Insufficient documentation

## 2018-10-12 DIAGNOSIS — G7000 Myasthenia gravis without (acute) exacerbation: Secondary | ICD-10-CM | POA: Diagnosis not present

## 2018-10-12 DIAGNOSIS — D696 Thrombocytopenia, unspecified: Secondary | ICD-10-CM

## 2018-10-12 DIAGNOSIS — Z79899 Other long term (current) drug therapy: Secondary | ICD-10-CM | POA: Insufficient documentation

## 2018-10-12 DIAGNOSIS — G4730 Sleep apnea, unspecified: Secondary | ICD-10-CM | POA: Diagnosis not present

## 2018-10-12 LAB — CBC WITH DIFFERENTIAL/PLATELET
Abs Immature Granulocytes: 0.06 10*3/uL (ref 0.00–0.07)
Basophils Absolute: 0 10*3/uL (ref 0.0–0.1)
Basophils Relative: 0 %
EOS PCT: 0 %
Eosinophils Absolute: 0 10*3/uL (ref 0.0–0.5)
HCT: 41.4 % (ref 39.0–52.0)
Hemoglobin: 13.3 g/dL (ref 13.0–17.0)
Immature Granulocytes: 1 %
LYMPHS PCT: 25 %
Lymphs Abs: 2.8 10*3/uL (ref 0.7–4.0)
MCH: 31.5 pg (ref 26.0–34.0)
MCHC: 32.1 g/dL (ref 30.0–36.0)
MCV: 98.1 fL (ref 80.0–100.0)
Monocytes Absolute: 0.4 10*3/uL (ref 0.1–1.0)
Monocytes Relative: 3 %
Neutro Abs: 7.7 10*3/uL (ref 1.7–7.7)
Neutrophils Relative %: 71 %
Platelets: 164 10*3/uL (ref 150–400)
RBC: 4.22 MIL/uL (ref 4.22–5.81)
RDW: 13.2 % (ref 11.5–15.5)
WBC: 10.9 10*3/uL — AB (ref 4.0–10.5)
nRBC: 0 % (ref 0.0–0.2)

## 2018-10-12 LAB — COMPREHENSIVE METABOLIC PANEL
ALK PHOS: 41 U/L (ref 38–126)
ALT: 33 U/L (ref 0–44)
ANION GAP: 12 (ref 5–15)
AST: 26 U/L (ref 15–41)
Albumin: 4.1 g/dL (ref 3.5–5.0)
BUN: 22 mg/dL (ref 8–23)
CO2: 23 mmol/L (ref 22–32)
Calcium: 9.4 mg/dL (ref 8.9–10.3)
Chloride: 102 mmol/L (ref 98–111)
Creatinine, Ser: 0.88 mg/dL (ref 0.61–1.24)
GFR calc Af Amer: >60 mL/min (ref >60)
GFR calc non Af Amer: >60 mL/min (ref >60)
GLUCOSE: 289 mg/dL — AB (ref 70–99)
Potassium: 5 mmol/L (ref 3.5–5.1)
Sodium: 137 mmol/L (ref 135–145)
Total Bilirubin: 0.6 mg/dL (ref 0.3–1.2)
Total Protein: 7.4 g/dL (ref 6.5–8.1)

## 2018-10-12 LAB — LACTATE DEHYDROGENASE: LDH: 122 U/L (ref 98–192)

## 2018-10-13 ENCOUNTER — Encounter: Payer: Self-pay | Admitting: Internal Medicine

## 2018-10-19 ENCOUNTER — Other Ambulatory Visit: Payer: Self-pay

## 2018-10-19 ENCOUNTER — Inpatient Hospital Stay (HOSPITAL_COMMUNITY): Payer: Medicare Other | Admitting: Hematology

## 2018-10-19 ENCOUNTER — Encounter (HOSPITAL_COMMUNITY): Payer: Self-pay | Admitting: Hematology

## 2018-10-19 DIAGNOSIS — R61 Generalized hyperhidrosis: Secondary | ICD-10-CM

## 2018-10-19 DIAGNOSIS — Z85828 Personal history of other malignant neoplasm of skin: Secondary | ICD-10-CM

## 2018-10-19 DIAGNOSIS — K219 Gastro-esophageal reflux disease without esophagitis: Secondary | ICD-10-CM | POA: Diagnosis not present

## 2018-10-19 DIAGNOSIS — R739 Hyperglycemia, unspecified: Secondary | ICD-10-CM

## 2018-10-19 DIAGNOSIS — G7000 Myasthenia gravis without (acute) exacerbation: Secondary | ICD-10-CM | POA: Diagnosis not present

## 2018-10-19 DIAGNOSIS — G4730 Sleep apnea, unspecified: Secondary | ICD-10-CM | POA: Diagnosis not present

## 2018-10-19 DIAGNOSIS — R238 Other skin changes: Secondary | ICD-10-CM

## 2018-10-19 DIAGNOSIS — Z7984 Long term (current) use of oral hypoglycemic drugs: Secondary | ICD-10-CM

## 2018-10-19 DIAGNOSIS — F17210 Nicotine dependence, cigarettes, uncomplicated: Secondary | ICD-10-CM

## 2018-10-19 DIAGNOSIS — R233 Spontaneous ecchymoses: Secondary | ICD-10-CM

## 2018-10-19 DIAGNOSIS — D72820 Lymphocytosis (symptomatic): Secondary | ICD-10-CM

## 2018-10-19 DIAGNOSIS — Z79899 Other long term (current) drug therapy: Secondary | ICD-10-CM

## 2018-10-19 NOTE — Progress Notes (Signed)
Follow-up Visit   Date: 10/20/2018    Rodney Martinez MRN: [id number] DOB: 1951-07-20   Interim History: Rodney Martinez is a 67 y.o. right-handed Caucasian male with OSA, hypothyroidism, and diabetes mellitus returning to the clinic for follow-up of seropositive myasthenia gravis.  The patient was accompanied to the clinic by self.  History of present illness: Starting in mid January 2018, he developed slurred speech, difficulty swallowing, droopy eyelids, and weakness with moving his tongue. MRI brain on 1/22 did not show evidence of stroke.  There was high clinical suspicion for myasthenia so he was started on mestinon 30mg  three times daily; in the interim, AChR antibodies returned positive.  He saw my colleague, Dr. Tomi Likens initially who started him on prednisone 20mg  and increased mestinon to 60mg  three times daily and is here to establish care with me. Due to lack of improvement, his prednisone was titrated to 60mg  daily and IVIG was started in March. Within a month, his dysphagia and double vision improved.  In May, slow prednisone taper was started.  He was noted to have low platelets (127) and referred to hematology for evaluation, especially since he was also started on Cellcept.  By September he was down to prednisone 20mg  and getting IVIG ever 3 weeks.  UPDATE 07/20/2018: He is doing well on prednisone 15mg  daily and his last IVIG was 4 weeks apart.  He did not notice any new weakness, double vision, or ptosis.  He also remains on Cellcept 500mg  BID and mestinon 60mg  BID.    UPDATE 10/20/2018:  He is here for 3 month follow-up visit.  He was able to taper his IVIG to every 6 weeks without breakthrough weakness  Overall, he doing well.  He denies double vision.  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  He has one spell of slurred speech one evening, but this resolved the same evening.  He gets tired  daily and takes a nap.     Medications:  Current Outpatient Medications on File Prior to Visit  Medication Sig Dispense Refill  . ACCU-CHEK FASTCLIX LANCETS MISC Test once daily.  Dx E11.9 (Patient not taking: Reported on 10/19/2018) 102 each 3  . Calcium-Magnesium-Vitamin D (CALCIUM 500 PO) Take by mouth.    Marland Kitchen glucose blood (ACCU-CHEK GUIDE) test strip Test once daily.  Dx e11.9 (Patient not taking: Reported on 10/19/2018) 100 each 3  . levothyroxine (SYNTHROID, LEVOTHROID) 25 MCG tablet TAKE 1 TABLET BY MOUTH DAILY BEFORE BREAKFAST 90 tablet 1  . Melatonin 5 MG CAPS Take 5 mg by mouth at bedtime as needed (for sleep).     . metFORMIN (GLUCOPHAGE-XR) 500 MG 24 hr tablet Take one twice daily. 60 tablet 11  . mycophenolate (CELLCEPT) 500 MG tablet TAKE 1 TABLET BY MOUTH TWICE DAILY 180 tablet 3  . predniSONE (DELTASONE) 5 MG tablet Take 1 tablet (5 mg total) by mouth daily with breakfast. (Patient taking differently: Take 15 mg by mouth daily with breakfast. ) 30 tablet 5  . pyridostigmine (MESTINON) 60 MG tablet Take 1 tablet at 11am and 5pm.  Extra dose as needed 75 tablet 5  . tamsulosin (FLOMAX) 0.4 MG CAPS capsule TAKE 1 CAPSULE BY MOUTH ONCE DAILY 90 capsule 0  . vitamin C (ASCORBIC ACID) 500 MG tablet Take 500 mg by mouth daily.     No current facility-administered medications on file prior to visit.  Allergies: No Known Allergies  Review of Systems:  CONSTITUTIONAL: No fevers, chills, night sweats. EYES: No visual changes or eye pain ENT: No hearing changes.  No history of nose bleeds.   RESPIRATORY: No cough, wheezing and shortness of breath.   CARDIOVASCULAR: Negative for chest pain, and palpitations.   GI: Negative for abdominal discomfort, blood in stools or black stools.  No recent change in bowel habits.   GU:  No history of incontinence.   MUSCLOSKELETAL: No history of joint pain or swelling.  No myalgias.   SKIN: +for lesions, rash, and itching.   ENDOCRINE: Negative  for cold or heat intolerance, polydipsia or goiter.   PSYCH:  No depression or anxiety symptoms.   NEURO: As Above.   Vital Signs:  BP 104/68   Pulse 76   Ht 5\' 10"  (1.778 m)   Wt 253 lb (114.8 kg)   SpO2 97%   BMI 36.30 kg/m    General Medical Exam:   General:  Well appearing, comfortable  Eyes/ENT: see cranial nerve examination.   Neck: No masses appreciated.  Full range of motion without tenderness.  No carotid bruits. Respiratory:  Clear to auscultation, good air entry bilaterally.   Cardiac:  Regular rate and rhythm, no murmur.   Ext:  No edema   Neurological Exam: MENTAL STATUS including orientation to time, place, person, recent and remote memory, attention span and concentration, language, and fund of knowledge is normal.  Speech is much improved, no dysarthria.  CRANIAL NERVES:  Pupils equal round and reactive to light. Normal conjugate, extra-ocular eye movements in all directions of gaze. No ptosis at rest or with sustained upgaze.  Face is symmetric.  Facial muscles are 5/5 throughout - Orbicularis oris and oculi is 5/5; buccinator 5/5.  Palate elevates symmetrically.  Tongue is midline, tongue strength is 5/5.  Tongue movements are improved.   MOTOR:  Motor strength is 5/5 throughout.   COORDINATION/GAIT:  He is able to stand up without pushing off.   Gait narrow based and stable.  Data: AChR binding 1.01*, blocking 61*, modulating 25*  MRI brain wwo contrast 10/27/2017:  1. No acute intracranial process or abnormal enhancement. 2. Mild parenchymal brain volume loss for age.  CT chest w contrast 02/03/2018: No acute chest abnormality. Specifically, no evidence for a mediastinal mass or abnormal thymic tissue. Probable scarring in the left lower lung. Indeterminate 4 mm nodular density in the right middle lobe. No follow-up needed if patient is low-risk. Non-contrast chest CT can be considered in 12 months if patient is high-risk.   Lab Results  Component Value  Date   WBC 10.9 (H) 10/12/2018   HGB 13.3 10/12/2018   HCT 41.4 10/12/2018   MCV 98.1 10/12/2018   PLT 164 10/12/2018   Lab Results  Component Value Date   CREATININE 0.88 10/12/2018   BUN 22 10/12/2018   NA 137 10/12/2018   K 5.0 10/12/2018   CL 102 10/12/2018   CO2 23 10/12/2018    IMPRESSION/PLAN: 1.  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  No signs of weakness on exam. I will continue to slowly taper his prednisone and IVIG.  He was started on Cellcept in April 2019 and there is room to titrate this, if needed  - Reduce prednisone 12.5mg  daily. If he develops weakness, go back to taking 15mg  daily  - Taper IVIG 1mg /kg to every 6 weeks x 1,, then consider every 8 weeks only if needed  - Continue  Cellcept 500mg  twice daily - may need to titrate this, if unable to come off prednisone  - Continue mestinon 60mg  at 11am and 5pm  - CBC from 10/12/2018 is stable  2.  Long-term corticosteroid use  - Continue calcium supplementation to maintain 1200mg /d and vitamin D 800 IU/d  - Continue pepcid for GI prophylaxis  3.  Vitamin B12 deficiency  - Continue monthly injections - administered today  Return to clinic in 2 months   Thank you for allowing me to participate in patient's care.  If I can answer any additional questions, I would be pleased to do so.    Sincerely,     K. Posey Pronto, DO

## 2018-10-19 NOTE — Assessment & Plan Note (Signed)
1.  Easy bruising: -Easy bruising is limited to both forearms.  Patient reports thinning of skin of forearms for the last couple of years, noticed more easy bruising since he started prednisone in March.  No other bleeding episodes noted.  He also reports that his father had similar problem with easy bruising on both forearms.  He is on tapering dose of prednisone, 15 mg daily. - I have done preliminary work-up which showed a normal PT, PTT, and fibrinogen levels.  ANA and rheumatoid factor were negative.  Hepatitis panel was negative.  LDH, folic acid, S11 were within normal limits.  Von Willebrand screen was normal.  No further testing is necessary at this time.   2.  Monoclonal B-cell lymphocytosis: - We have done a flow cytometry on 05/10/2018 which shows CD5 positive, CD10 negative, CD20 positive B-cell lymphocytosis. -Today CBC shows normal lymphocyte count.  He does not have any palpable adenopathy or splenomegaly. -I have discussed with him that this could be a precursor for CLL.  However we will monitor his blood counts. -I will see him back in 6 months for follow-up.  If the counts are stable at next visit, we will switch him to yearly visits.  3.  Myasthenia gravis: -He was diagnosed with seropositive bulbar myasthenia gravis and was started on prednisone and CellCept. -Currently he is on prednisone 15 mg daily and CellCept.

## 2018-10-19 NOTE — Patient Instructions (Signed)
[facility] Discharge Instructions  Follow up in 6 months with labs    Thank you for choosing  Can[facility] Center at [facility] to provide your oncology and hematology care.  To afford each patient quality time with our provider, please arrive at least 15 minutes before your scheduled appointment time.   If you have a lab appointment with the Cancer Center please come in thru the  Main Entrance and check in at the main information desk  You need to re-schedule your appointment should you arrive 10 or more minutes late.  We strive to give you quality time with our providers, and arriving late affects you and other patients whose appointments are after yours.  Also, if you no show three or more times for appointments you may be dismissed from the clinic at the providers discretion.     Again, thank you for choosing [facility].  Our hope is that these requests will decrease the amount of time that you wait before being seen by our physicians.       _____________________________________________________________  Should you have questions after your visit to [facility], please contact our office at [phone] between the hours of 8:00 a.m. and 4:30 p.m.  Voicemails left after 4:00 p.m. will not be returned until the following business day.  For prescription refill requests, have your pharmacy contact our office and allow 72 hours.    Cancer Center Support Programs:   > Cancer Support Group  2nd Tuesday of the month 1pm-2pm, Journey Room    

## 2018-10-19 NOTE — Progress Notes (Signed)
Rodney Martinez, [place] [address]   CLINIC:  Medical Oncology/Hematology  PCP:  Rodney Post, MD [place] [address] [phone]   REASON FOR VISIT: Follow-up for easy bruising and mild thrombocytopenia   CURRENT THERAPY:  Prednisone and Cellcept daily   INTERVAL HISTORY:  Rodney Martinez y.o. male returns for routine follow-up for easy bruising and mild thrombocytopenia. He is experiencing itching mostly to his pelvic region. Otherwise he is feeling fine. 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 He has an appointment to follow up with him soon. He does report mild night sweats but denies any fevers, chills, or unexplained weight loss.  Denies any nausea, vomiting, or diarrhea. Denies any new pains. Had not noticed any recent bleeding such as epistaxis, hematuria or hematochezia. Denies recent chest pain on exertion, shortness of breath on minimal exertion, pre-syncopal episodes, or palpitations. Denies any numbness or tingling in hands or feet. Denies any recent fevers, infections, or recent hospitalizations. He reports his appetite at 100% and his energy level at 50%.    REVIEW OF SYSTEMS:  Review of Systems  Skin: Positive for itching.  All other systems reviewed and are negative.    PAST MEDICAL/SURGICAL HISTORY:  Past Medical History:  Diagnosis Date  . GERD 12/12/2009  . HYPERGLYCEMIA 12/12/2009  . OBSTRUCTIVE SLEEP APNEA 08/14/2010  . TRANSAMINASES, SERUM, ELEVATED 12/12/2009   Past Surgical History:  Procedure Laterality Date  . BASAL CELL CARCINOMA EXCISION  2007   face (Mohs)  . HERNIA REPAIR  2952   umbilical     SOCIAL HISTORY:  Social History   Socioeconomic History  . Marital status: Married    Spouse name: Not on file  . Number of children: 1  . Years of education: vet school  . Highest education level: Professional school degree (e.g., MD, DDS, DVM, JD)    Occupational History  . Occupation: retired Armed forces operational officer  . Financial resource strain: Not on file  . Food insecurity:    Worry: Not on file    Inability: Not on file  . Transportation needs:    Medical: Not on file    Non-medical: Not on file  Tobacco Use  . Smoking status: Current Every Day Smoker    Years: 10.00    Types: Cigars  . Smokeless tobacco: Never Used  . Tobacco comment: Pt states that he smokes 6 cigars a day, he smoked cigarettes for 10 years.  Substance and Sexual Activity  . Alcohol use: Yes    Comment: rare  . Drug use: No  . Sexual activity: Not on file  Lifestyle  . Physical activity:    Days per week: Not on file    Minutes per session: Not on file  . Stress: Not on file  Relationships  . Social connections:    Talks on phone: Not on file    Gets together: Not on file    Attends religious service: Not on file    Active member of club or organization: Not on file    Attends meetings of clubs or organizations: Not on file    Relationship status: Not on file  . Intimate partner violence:    Fear of current or ex partner: Not on file    Emotionally abused: Not on file    Physically abused: Not on file    Forced sexual activity: Not on file  Other Topics  Concern  . Not on file  Social History Narrative   Lives with wife in a 2 story home.  Has one child. Retired Animal nutritionist.      FAMILY HISTORY:  Family History  Problem Relation Age of Onset  . Diabetes Mother        type ll  . Heart disease Father        ?atrial fibrillation  . Diabetes Maternal Grandfather   . Diabetes Paternal Grandmother   . Diabetes Paternal Grandfather   . Colon cancer Neg Hx   . Stomach cancer Neg Hx     CURRENT MEDICATIONS:  Outpatient Encounter Medications as of 10/19/2018  Medication Sig  . Calcium-Magnesium-Vitamin D (CALCIUM 500 PO) Take by mouth.  . levothyroxine (SYNTHROID, LEVOTHROID) 25 MCG tablet TAKE 1 TABLET BY MOUTH DAILY BEFORE  BREAKFAST  . Melatonin 5 MG CAPS Take 5 mg by mouth at bedtime as needed (for sleep).   . metFORMIN (GLUCOPHAGE-XR) 500 MG 24 hr tablet Take one twice daily.  . mycophenolate (CELLCEPT) 500 MG tablet TAKE 1 TABLET BY MOUTH TWICE DAILY  . predniSONE (DELTASONE) 5 MG tablet Take 1 tablet (5 mg total) by mouth daily with breakfast. (Patient taking differently: Take 15 mg by mouth daily with breakfast. )  . pyridostigmine (MESTINON) 60 MG tablet Take 1 tablet at 11am and 5pm.  Extra dose as needed  . tamsulosin (FLOMAX) 0.4 MG CAPS capsule TAKE 1 CAPSULE BY MOUTH ONCE DAILY  . vitamin C (ASCORBIC ACID) 500 MG tablet Take 500 mg by mouth daily.  Marland Kitchen ACCU-CHEK FASTCLIX LANCETS MISC Test once daily.  Dx E11.9 (Patient not taking: Reported on 10/19/2018)  . glucose blood (ACCU-CHEK GUIDE) test strip Test once daily.  Dx e11.9 (Patient not taking: Reported on 10/19/2018)   No facility-administered encounter medications on file as of 10/19/2018.     ALLERGIES:  No Known Allergies   PHYSICAL EXAM:  ECOG Performance status: 1  Vitals:   10/19/18 1500  BP: 134/Martinez  Pulse: 64  Resp: 16  Temp: 97.9 F (36.6 C)  SpO2: 98%   Filed Weights   10/19/18 1500  Weight: 255 lb 5 oz (115.8 kg)    Physical Exam Constitutional:      Appearance: Normal appearance. He is normal weight.  Musculoskeletal: Normal range of motion.  Skin:    General: Skin is warm and dry.  Neurological:     Mental Status: He is alert and oriented to person, place, and time. Mental status is at baseline.  Psychiatric:        Mood and Affect: Mood normal.        Behavior: Behavior normal.        Thought Content: Thought content normal.        Judgment: Judgment normal.    No palpable adenopathy or splenomegaly.  LABORATORY DATA:  I have reviewed the labs as listed.  CBC    Component Value Date/Time   WBC 10.9 (H) 10/12/2018 1108   RBC 4.22 10/12/2018 1108   HGB 13.3 10/12/2018 1108   HCT 41.4 10/12/2018 1108   PLT  164 10/12/2018 1108   MCV 98.1 10/12/2018 1108   MCH 31.5 10/12/2018 1108   MCHC 32.1 10/12/2018 1108   RDW 13.2 10/12/2018 1108   LYMPHSABS 2.8 10/12/2018 1108   MONOABS 0.4 10/12/2018 1108   EOSABS 0.0 10/12/2018 1108   BASOSABS 0.0 10/12/2018 1108   CMP Latest Ref Rng & Units 10/12/2018 09/21/2018 07/20/2018  Glucose Martinez -  99 mg/dL 289(H) 257(H) 201(H)  BUN 8 - 23 mg/dL 22 10 20   Creatinine 0.61 - 1.24 mg/dL 0.88 0.88 0.86  Sodium 135 - 145 mmol/L 137 140 134(L)  Potassium 3.5 - 5.1 mmol/L 5.0 4.1 5.4(H)  Chloride 98 - 111 mmol/L 102 103 104  CO2 22 - 32 mmol/L 23 30 27   Calcium 8.9 - 10.3 mg/dL 9.4 9.1 9.1  Total Protein 6.5 - 8.1 g/dL 7.4 - 8.3(H)  Total Bilirubin 0.3 - 1.2 mg/dL 0.6 - 0.3  Alkaline Phos 38 - 126 U/L 41 - -  AST 15 - 41 U/L 26 - 18  ALT 0 - 44 U/L 33 - 23       DIAGNOSTIC IMAGING:  I have independently reviewed the scans and discussed with the patient.   I have reviewed Francene Finders, NP's note and agree with the documentation.  I personally performed a face-to-face visit, made revisions and my assessment and plan is as follows.    ASSESSMENT & PLAN:   Easy bruising 1.  Easy bruising: -Easy bruising is limited to both forearms.  Patient reports thinning of skin of forearms for the last couple of years, noticed more easy bruising since he started prednisone in March.  No other bleeding episodes noted.  He also reports that his father had similar problem with easy bruising on both forearms.  He is on tapering dose of prednisone, 15 mg daily. - I have done preliminary work-up which showed a normal PT, PTT, and fibrinogen levels.  ANA and rheumatoid factor were negative.  Hepatitis panel was negative.  LDH, folic acid, W09 were within normal limits.  Von Willebrand screen was normal.  No further testing is necessary at this time.   2.  Monoclonal B-cell lymphocytosis: - We have done a flow cytometry on 05/10/2018 which shows CD5 positive, CD10 negative, CD20  positive B-cell lymphocytosis. -Today CBC shows normal lymphocyte count.  He does not have any palpable adenopathy or splenomegaly. -I have discussed with him that this could be a precursor for CLL.  However we will monitor his blood counts. -I will see him back in 6 months for follow-up.  If the counts are stable at next visit, we will switch him to yearly visits.  3.  Myasthenia gravis: -He was diagnosed with seropositive bulbar myasthenia gravis and was started on prednisone and CellCept. -Currently he is on prednisone 15 mg daily and CellCept.      Orders placed this encounter:  No orders of the defined types were placed in this encounter.     Derek Jack, MD [place] [phone]

## 2018-10-20 ENCOUNTER — Telehealth: Payer: Self-pay | Admitting: Neurology

## 2018-10-20 ENCOUNTER — Encounter: Payer: Self-pay | Admitting: Neurology

## 2018-10-20 ENCOUNTER — Ambulatory Visit (INDEPENDENT_AMBULATORY_CARE_PROVIDER_SITE_OTHER): Payer: Medicare Other | Admitting: Neurology

## 2018-10-20 VITALS — BP 104/68 | HR 76 | Ht 70.0 in | Wt 253.0 lb

## 2018-10-20 DIAGNOSIS — E538 Deficiency of other specified B group vitamins: Secondary | ICD-10-CM | POA: Diagnosis not present

## 2018-10-20 DIAGNOSIS — G7000 Myasthenia gravis without (acute) exacerbation: Secondary | ICD-10-CM | POA: Diagnosis not present

## 2018-10-20 MED ORDER — CYANOCOBALAMIN 1000 MCG/ML IJ SOLN
1000.0000 ug | Freq: Once | INTRAMUSCULAR | Status: AC
  Administered 2018-10-20: 1000 ug via INTRAMUSCULAR

## 2018-10-20 NOTE — Patient Instructions (Addendum)
Reduce prednisone to 12.5mg  daily for one month, then reduce to 10mg  daily  Continue IVIG every 6 weeks one more time   Continue Cellcept 500mg  twice daily  Return to clinic in 2 months

## 2018-10-20 NOTE — Progress Notes (Signed)
B12 injection to right deltoid with no apparent complications.   

## 2018-10-20 NOTE — Telephone Encounter (Signed)
Rodney Martinez from [facility] called about clarification on the IVIG frequency on this patient. Please call back at [phone]. Thanks!

## 2018-10-20 NOTE — Telephone Encounter (Signed)
I called them back and gave them verbal orders per Dr. Posey Pronto.

## 2018-10-28 DIAGNOSIS — L089 Local infection of the skin and subcutaneous tissue, unspecified: Secondary | ICD-10-CM | POA: Diagnosis not present

## 2018-10-28 DIAGNOSIS — D04121 Carcinoma in situ of skin of left upper eyelid, including canthus: Secondary | ICD-10-CM | POA: Diagnosis not present

## 2018-10-29 ENCOUNTER — Telehealth: Payer: Self-pay | Admitting: *Deleted

## 2018-10-29 NOTE — Telephone Encounter (Signed)
Patient called stating that his speech is more slurred since going down to 12.5 mg of prednisone from 15 mg.  His infusions have also been spaced out to 6 weeks apart.  Please advise.

## 2018-10-29 NOTE — Telephone Encounter (Signed)
Please have him increase prednisone back to 15mg  daily.  Call with an update in 1 week, if no improvement will resume IVIG every 28 days.

## 2018-10-29 NOTE — Telephone Encounter (Signed)
Instructions sent via MyChart.

## 2018-11-04 ENCOUNTER — Ambulatory Visit (AMBULATORY_SURGERY_CENTER): Payer: Self-pay | Admitting: *Deleted

## 2018-11-04 VITALS — Ht 71.0 in | Wt 259.0 lb

## 2018-11-04 DIAGNOSIS — Z86010 Personal history of colonic polyps: Secondary | ICD-10-CM

## 2018-11-04 MED ORDER — NA SULFATE-K SULFATE-MG SULF 17.5-3.13-1.6 GM/177ML PO SOLN: ORAL | 0 refills | Status: DC

## 2018-11-04 NOTE — Progress Notes (Signed)
Patient denies any allergies to eggs or soy. Patient denies any problems with anesthesia/sedation. Patient denies any oxygen use at home. Patient denies taking any diet/weight loss medications or blood thinners. EMMI education offered, pt declined.  

## 2018-11-05 ENCOUNTER — Encounter: Payer: Self-pay | Admitting: Family Medicine

## 2018-11-05 ENCOUNTER — Encounter: Payer: Self-pay | Admitting: Internal Medicine

## 2018-11-08 DIAGNOSIS — G7001 Myasthenia gravis with (acute) exacerbation: Secondary | ICD-10-CM | POA: Diagnosis not present

## 2018-11-09 DIAGNOSIS — G7001 Myasthenia gravis with (acute) exacerbation: Secondary | ICD-10-CM | POA: Diagnosis not present

## 2018-11-18 ENCOUNTER — Ambulatory Visit (AMBULATORY_SURGERY_CENTER): Payer: Medicare Other | Admitting: Internal Medicine

## 2018-11-18 ENCOUNTER — Encounter: Payer: Self-pay | Admitting: Internal Medicine

## 2018-11-18 VITALS — BP 114/69 | HR 61 | Temp 97.3°F | Resp 13 | Ht 71.0 in | Wt 259.0 lb

## 2018-11-18 DIAGNOSIS — D122 Benign neoplasm of ascending colon: Secondary | ICD-10-CM | POA: Diagnosis not present

## 2018-11-18 DIAGNOSIS — Z1211 Encounter for screening for malignant neoplasm of colon: Secondary | ICD-10-CM | POA: Diagnosis not present

## 2018-11-18 DIAGNOSIS — D125 Benign neoplasm of sigmoid colon: Secondary | ICD-10-CM | POA: Diagnosis not present

## 2018-11-18 DIAGNOSIS — Z860100 Personal history of colon polyps, unspecified: Secondary | ICD-10-CM

## 2018-11-18 DIAGNOSIS — D124 Benign neoplasm of descending colon: Secondary | ICD-10-CM

## 2018-11-18 DIAGNOSIS — D123 Benign neoplasm of transverse colon: Secondary | ICD-10-CM | POA: Diagnosis not present

## 2018-11-18 MED ORDER — SODIUM CHLORIDE 0.9 % IV SOLN: 500.0000 mL | Freq: Once | INTRAVENOUS | Status: DC

## 2018-11-18 NOTE — Progress Notes (Signed)
To PACU, VSS. Report to [email or web address] 

## 2018-11-18 NOTE — Patient Instructions (Signed)
Thank you for allowing [name] to care for you today!  Await pathology results by mail, approximately 2 weeks.  Next colonoscopy will be recommended at that time.  Resume previous diet and medications today.  Return to normal activities tomorrow.  Handout given for polyps.    YOU HAD AN ENDOSCOPIC PROCEDURE TODAY AT [facility]:   Refer to the procedure report that was given to you for any specific questions about what was found during the examination.  If the procedure report does not answer your questions, please call your gastroenterologist to clarify.  If you requested that your care partner not be given the details of your procedure findings, then the procedure report has been included in a sealed envelope for you to review at your convenience later.  YOU SHOULD EXPECT: Some feelings of bloating in the abdomen. Passage of more gas than usual.  Walking can help get rid of the air that was put into your GI tract during the procedure and reduce the bloating. If you had a lower endoscopy (such as a colonoscopy or flexible sigmoidoscopy) you may notice spotting of blood in your stool or on the toilet paper. If you underwent a bowel prep for your procedure, you may not have a normal bowel movement for a few days.  Please Note:  You might notice some irritation and congestion in your nose or some drainage.  This is from the oxygen used during your procedure.  There is no need for concern and it should clear up in a day or so.  SYMPTOMS TO REPORT IMMEDIATELY:   Following lower endoscopy (colonoscopy or flexible sigmoidoscopy):  Excessive amounts of blood in the stool  Significant tenderness or worsening of abdominal pains  Swelling of the abdomen that is new, acute  Fever of 100F or higher    For urgent or emergent issues, a gastroenterologist can be reached at any hour by calling [phone].   DIET:  We do recommend a small meal at first, but then you may proceed to  your regular diet.  Drink plenty of fluids but you should avoid alcoholic beverages for 24 hours.  ACTIVITY:  You should plan to take it easy for the rest of today and you should NOT DRIVE or use heavy machinery until tomorrow (because of the sedation medicines used during the test).    FOLLOW UP: Our staff will call the number listed on your records the next business day following your procedure to check on you and address any questions or concerns that you may have regarding the information given to you following your procedure. If we do not reach you, we will leave a message.  However, if you are feeling well and you are not experiencing any problems, there is no need to return our call.  We will assume that you have returned to your regular daily activities without incident.  If any biopsies were taken you will be contacted by phone or by letter within the next 1-3 weeks.  Please call us at [phone] if you have not heard about the biopsies in 3 weeks.    SIGNATURES/CONFIDENTIALITY: You and/or your care partner have signed paperwork which will be entered into your electronic medical record.  These signatures attest to the fact that that the information above on your After Visit Summary has been reviewed and is understood.  Full responsibility of the confidentiality of this discharge information lies with you and/or your care-partner.

## 2018-11-18 NOTE — Progress Notes (Signed)
Called to room to assist during endoscopic procedure.  Patient ID and intended procedure confirmed with present staff. Received instructions for my participation in the procedure from the performing physician.  

## 2018-11-18 NOTE — Progress Notes (Signed)
Pt's states no medical or surgical changes since previsit or office visit. 

## 2018-11-18 NOTE — Op Note (Signed)
[name] Procedure Date: 11/18/2018 10:41 AM MRN: [id number] Endoscopist: Jerene Bears , MD Age: 68 Referring MD:  Date of Birth: 1950-10-14 Gender: Male Account #: [id number] Procedure:                Colonoscopy Indications:              Surveillance: Personal history of adenomatous                            polyps on last colonoscopy > 5 years ago (2013) Medicines:                Monitored Anesthesia Care Procedure:                Pre-Anesthesia Assessment:                           - Prior to the procedure, a History and Physical                            was performed, and patient medications and                            allergies were reviewed. The patient's tolerance of                            previous anesthesia was also reviewed. The risks                            and benefits of the procedure and the sedation                            options and risks were discussed with the patient.                            All questions were answered, and informed consent                            was obtained. Prior Anticoagulants: The patient has                            taken no previous anticoagulant or antiplatelet                            agents. ASA Grade Assessment: III - A patient with                            severe systemic disease. After reviewing the risks                            and benefits, the patient was deemed in                            satisfactory condition to undergo the procedure.  After obtaining informed consent, the colonoscope                            was passed under direct vision. Throughout the                            procedure, the patient's blood pressure, pulse, and                            oxygen saturations were monitored continuously. The                            Colonoscope was introduced through the anus and                            advanced to the  cecum, identified by appendiceal                            orifice and ileocecal valve. The colonoscopy was                            performed without difficulty. The patient tolerated                            the procedure well. The quality of the bowel                            preparation was good. The ileocecal valve,                            appendiceal orifice, and rectum were photographed. Scope In: 10:57:05 AM Scope Out: 11:22:05 AM Scope Withdrawal Time: 0 hours 20 minutes 4 seconds  Total Procedure Duration: 0 hours 25 minutes 0 seconds  Findings:                 The digital rectal exam was normal.                           Three sessile polyps were found in the ascending                            colon. The polyps were 2 to 6 mm in size. These                            polyps were removed with a cold snare. Resection                            and retrieval were complete.                           Four sessile polyps were found in the transverse                            colon. The polyps were 4 to  6 mm in size. These                            polyps were removed with a cold snare. Resection                            and retrieval were complete.                           Three sessile polyps were found in the descending                            colon. The polyps were 4 to 6 mm in size. These                            polyps were removed with a cold snare. Resection                            and retrieval were complete.                           A 7 mm polyp was found in the sigmoid colon. The                            polyp was sessile. The polyp was removed with a                            cold snare. Resection and retrieval were complete.                           Multiple small and large-mouthed diverticula were                            found in the sigmoid colon and descending colon.                           The retroflexed view of the distal rectum  and anal                            verge was normal and showed no anal or rectal                            abnormalities. Complications:            No immediate complications. Estimated blood loss:                            None. Estimated Blood Loss:     Estimated blood loss: none. Estimated blood loss                            was minimal. Impression:               - Three 2 to 6 mm polyps in the ascending colon,  removed with a cold snare. Resected and retrieved.                           - Four 4 to 6 mm polyps in the transverse colon,                            removed with a cold snare. Resected and retrieved.                           - Three 4 to 6 mm polyps in the descending colon,                            removed with a cold snare. Resected and retrieved.                           - One 7 mm polyp in the sigmoid colon, removed with                            a cold snare. Resected and retrieved.                           - Diverticulosis in the sigmoid colon and in the                            descending colon.                           - The distal rectum and anal verge are normal on                            retroflexion view. Recommendation:           - Patient has a contact number available for                            emergencies. The signs and symptoms of potential                            delayed complications were discussed with the                            patient. Return to normal activities tomorrow.                            Written discharge instructions were provided to the                            patient.                           - Resume previous diet.                           - Continue present medications.                           -  Await pathology results.                           - Repeat colonoscopy is recommended for                            surveillance. The colonoscopy date will be                             determined after pathology results from today's                            exam become available for review. Jerene Bears, MD 11/18/2018 11:26:50 AM This report has been signed electronically.

## 2018-11-19 ENCOUNTER — Telehealth: Payer: Self-pay

## 2018-11-19 ENCOUNTER — Telehealth: Payer: Self-pay | Admitting: *Deleted

## 2018-11-19 NOTE — Telephone Encounter (Signed)
  Follow up Call-  Call back number 11/18/2018  Post procedure Call Back phone  # [phone]  Permission to leave phone message Yes  Some recent data might be hidden     Patient questions:  Do you have a fever, pain , or abdominal swelling? No. Pain Score  0 *  Have you tolerated food without any problems? Yes.    Have you been able to return to your normal activities? Yes.    Do you have any questions about your discharge instructions: Diet   No. Medications  No. Follow up visit  No.  Do you have questions or concerns about your Care? No.  Actions: * If pain score is 4 or above: No action needed, pain <4.  No problems noted per pt. maw

## 2018-11-19 NOTE — Telephone Encounter (Signed)
  Follow up Call-  Call back number 11/18/2018  Post procedure Call Back phone  # [phone]  Permission to leave phone message Yes  Some recent data might be hidden   [facility]

## 2018-11-23 ENCOUNTER — Encounter: Payer: Self-pay | Admitting: Internal Medicine

## 2018-12-14 NOTE — Progress Notes (Signed)
Follow-up Visit   Date: 12/15/2018    Rodney Martinez MRN: [id number] DOB: 1951-04-08   Interim History: Rodney Martinez is a 68 y.o. right-handed Caucasian male with OSA, hypothyroidism, and diabetes mellitus returning to the clinic for follow-up of seropositive myasthenia gravis.  The patient was accompanied to the clinic by self.  History of present illness: Starting in mid January 2018, he developed slurred speech, difficulty swallowing, droopy eyelids, and weakness with moving his tongue. MRI brain on 1/22 did not show evidence of stroke.  There was high clinical suspicion for myasthenia so he was started on mestinon 30mg  three times daily; in the interim, AChR antibodies returned positive.  He saw my colleague, Dr. Tomi Likens initially who started him on prednisone 20mg  and increased mestinon to 60mg  three times daily and is here to establish care with me. Due to lack of improvement, his prednisone was titrated to 60mg  daily and IVIG was started in March. Within a month, his dysphagia and double vision improved.  In May, slow prednisone taper was started.  He was noted to have low platelets (127) and referred to hematology for evaluation, especially since he was also started on Cellcept.  By September he was down to prednisone 20mg  and getting IVIG ever 3 weeks.  UPDATE 07/20/2018: He is doing well on prednisone 15mg  daily and his last IVIG was 4 weeks apart.  He did not notice any new weakness, double vision, or ptosis.  He also remains on Cellcept 500mg  BID and mestinon 60mg  BID.    UPDATE 10/20/2018:  He is here for 3 month follow-up visit.  He was able to taper his IVIG to every 6 weeks without breakthrough weakness  Overall, he doing well.  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    UPDATE 12/15/2018:  He was unable to taper prednisone to 12.5mg  due to increased slurred speech, therefore it was increased  back to prednisone 15mg , which has improved.  He has noticed mild slurred speech by mid-afternoon.  No droopy eyelids, double vision, and limb weakness.  His IVIG is also being tapered to every 6-8 weeks.  His last infusion was on on 2/3 and next scheduled for 3/30. He continues to feel very tired and needs to take naps during the day.  Medications:  Current Outpatient Medications on File Prior to Visit  Medication Sig Dispense Refill  . ACCU-CHEK FASTCLIX LANCETS MISC Test once daily.  Dx E11.9 102 each 3  . Calcium-Magnesium-Vitamin D (CALCIUM 500 PO) Take by mouth.    Marland Kitchen glucose blood (ACCU-CHEK GUIDE) test strip Test once daily.  Dx e11.9 100 each 3  . levothyroxine (SYNTHROID, LEVOTHROID) 25 MCG tablet TAKE 1 TABLET BY MOUTH DAILY BEFORE BREAKFAST 90 tablet 1  . Melatonin 5 MG CAPS Take 5 mg by mouth at bedtime as needed (for sleep).     . metFORMIN (GLUCOPHAGE-XR) 500 MG 24 hr tablet Take one twice daily. (Patient taking differently: Take 500 mg by mouth 2 (two) times daily. Take one twice daily.) 60 tablet 11  . omeprazole (PRILOSEC) 20 MG capsule Take 20 mg by mouth daily.    Marland Kitchen pyridostigmine (MESTINON) 60 MG tablet Take 1 tablet at 11am and 5pm.  Extra dose as needed 75 tablet 5  . tamsulosin (FLOMAX) 0.4 MG CAPS capsule TAKE 1 CAPSULE BY MOUTH ONCE DAILY 90 capsule 0  . vitamin C (ASCORBIC ACID)  500 MG tablet Take 500 mg by mouth daily.     No current facility-administered medications on file prior to visit.     Allergies: No Known Allergies  Review of Systems:  CONSTITUTIONAL: No fevers, chills, night sweats, or weight loss.  EYES: No visual changes or eye pain ENT: No hearing changes.  No history of nose bleeds.   RESPIRATORY: No cough, wheezing and shortness of breath.   CARDIOVASCULAR: Negative for chest pain, and palpitations.   GI: Negative for abdominal discomfort, blood in stools or black stools.  No recent change in bowel habits.   GU:  No history of incontinence.     MUSCLOSKELETAL: No history of joint pain or swelling.  No myalgias.   SKIN: Negative for lesions, rash, and itching.   ENDOCRINE: Negative for cold or heat intolerance, polydipsia or goiter.   PSYCH:  No depression or anxiety symptoms.   NEURO: As Above.  Vital Signs:  BP 110/68   Pulse (!) 56   Temp 97.7 F (36.5 C)   Ht 5\' 10"  (1.778 m)   Wt 264 lb (119.7 kg)   SpO2 96%   BMI 37.88 kg/m    General Medical Exam:   General:  Well appearing, comfortable  Eyes/ENT: see cranial nerve examination.   Neck:   No carotid bruits. Respiratory:  Clear to auscultation, good air entry bilaterally.   Cardiac:  Regular rate and rhythm, no murmur.   Ext:  No edema  Neurological Exam: MENTAL STATUS including orientation to time, place, person, recent and remote memory, attention span and concentration, language, and fund of knowledge is normal.  Speech is much improved, minimal dysarthria.  CRANIAL NERVES:  Pupils equal round and reactive to light. Normal conjugate, extra-ocular eye movements in all directions of gaze. No ptosis at rest or with sustained upgaze.  Face is symmetric.  Facial muscles are 5/5 throughout - Orbicularis oris and oculi is 5/5; buccinator 5/5.  Palate elevates symmetrically.  Tongue is midline, tongue strength is 5/5.    MOTOR:  Motor strength is 5/5 throughout.   COORDINATION/GAIT:  He is able to stand up without pushing off.   Gait narrow based and stable.  Data: AChR binding 1.01*, blocking 61*, modulating 25*  MRI brain wwo contrast 10/27/2017:  1. No acute intracranial process or abnormal enhancement. 2. Mild parenchymal brain volume loss for age.  CT chest w contrast 02/03/2018: No acute chest abnormality. Specifically, no evidence for a mediastinal mass or abnormal thymic tissue. Probable scarring in the left lower lung. Indeterminate 4 mm nodular density in the right middle lobe. No follow-up needed if patient is low-risk. Non-contrast chest CT can be  considered in 12 months if patient is high-risk.   Lab Results  Component Value Date   WBC 10.9 (H) 10/12/2018   HGB 13.3 10/12/2018   HCT 41.4 10/12/2018   MCV 98.1 10/12/2018   PLT 164 10/12/2018   Lab Results  Component Value Date   CREATININE 0.88 10/12/2018   BUN 22 10/12/2018   NA 137 10/12/2018   K 5.0 10/12/2018   CL 102 10/12/2018   CO2 23 10/12/2018    IMPRESSION/PLAN: 1. Seropositive bulbar myasthenia gravis with exacerbation (diagnosed 11/2017).  He has noticed mild slurred speech, otherwise no new symptoms.  Exam shows intact bulbar and motor strength. Cellcept started in April 2019.  - Continue prednisone 15mg  daily.  He developed slurred speech at 12.5mg   - Increase Cellcept to 750mg  twice daily  - Continue IVIG  1mg /kg every 8 weeks  - Continue mestinon 60mg  at 11am and 5pm  - Check CBC and CMP at next visit  2.  Long-term corticosteroid use  - Continue calcium supplementation to maintain 1200mg /d and vitamin D 800 IU/d  - Continue pepcid for GI prophylaxis  3.  Vitamin B12 deficiency  - Continue monthly injections until May and will recheck levels.  B12 injection administered today  Return to clinic in 2 months   Thank you for allowing me to participate in patient's care.  If I can answer any additional questions, I would be pleased to do so.    Sincerely,    Cruise Baumgardner K. Posey Pronto, DO

## 2018-12-15 ENCOUNTER — Ambulatory Visit: Payer: Medicare Other | Admitting: Neurology

## 2018-12-15 ENCOUNTER — Other Ambulatory Visit: Payer: Self-pay

## 2018-12-15 ENCOUNTER — Encounter: Payer: Self-pay | Admitting: Neurology

## 2018-12-15 ENCOUNTER — Other Ambulatory Visit: Payer: Self-pay | Admitting: Family Medicine

## 2018-12-15 VITALS — BP 110/68 | HR 56 | Temp 97.7°F | Ht 70.0 in | Wt 264.0 lb

## 2018-12-15 DIAGNOSIS — G7000 Myasthenia gravis without (acute) exacerbation: Secondary | ICD-10-CM

## 2018-12-15 DIAGNOSIS — E538 Deficiency of other specified B group vitamins: Secondary | ICD-10-CM

## 2018-12-15 DIAGNOSIS — G7001 Myasthenia gravis with (acute) exacerbation: Secondary | ICD-10-CM

## 2018-12-15 MED ORDER — PREDNISONE 5 MG PO TABS: 15.0000 mg | ORAL_TABLET | Freq: Every day | ORAL | Status: DC

## 2018-12-15 MED ORDER — MYCOPHENOLATE MOFETIL 500 MG PO TABS: 750.0000 mg | ORAL_TABLET | Freq: Two times a day (BID) | ORAL | 3 refills | Status: DC

## 2018-12-15 MED ORDER — CYANOCOBALAMIN 1000 MCG/ML IJ SOLN
1000.0000 ug | Freq: Once | INTRAMUSCULAR | Status: AC
  Administered 2018-12-15: 1000 ug via INTRAMUSCULAR

## 2018-12-15 NOTE — Patient Instructions (Addendum)
Increase Cellcept to 750mg  (1.5 tab) twice daily  Continue prednisone 15mg  daily  Continue IVIG every 8 weeks  Continue B12 injection until May  Return to clinic in May

## 2018-12-28 ENCOUNTER — Telehealth: Payer: Self-pay | Admitting: *Deleted

## 2018-12-28 NOTE — Telephone Encounter (Signed)
Medication is approved until 03/29/2019.

## 2018-12-31 ENCOUNTER — Ambulatory Visit: Payer: Medicare Other | Admitting: Neurology

## 2019-01-03 DIAGNOSIS — G7001 Myasthenia gravis with (acute) exacerbation: Secondary | ICD-10-CM | POA: Diagnosis not present

## 2019-01-04 DIAGNOSIS — G7001 Myasthenia gravis with (acute) exacerbation: Secondary | ICD-10-CM | POA: Diagnosis not present

## 2019-01-17 ENCOUNTER — Ambulatory Visit: Payer: Medicare Other

## 2019-02-09 ENCOUNTER — Other Ambulatory Visit: Payer: Self-pay | Admitting: Dermatology

## 2019-02-09 DIAGNOSIS — D229 Melanocytic nevi, unspecified: Secondary | ICD-10-CM | POA: Diagnosis not present

## 2019-02-09 DIAGNOSIS — D485 Neoplasm of uncertain behavior of skin: Secondary | ICD-10-CM | POA: Diagnosis not present

## 2019-02-09 DIAGNOSIS — D692 Other nonthrombocytopenic purpura: Secondary | ICD-10-CM | POA: Diagnosis not present

## 2019-02-09 DIAGNOSIS — L570 Actinic keratosis: Secondary | ICD-10-CM | POA: Diagnosis not present

## 2019-02-13 ENCOUNTER — Other Ambulatory Visit: Payer: Self-pay | Admitting: Neurology

## 2019-02-16 ENCOUNTER — Ambulatory Visit: Payer: Medicare Other | Admitting: Neurology

## 2019-03-01 DIAGNOSIS — G7001 Myasthenia gravis with (acute) exacerbation: Secondary | ICD-10-CM | POA: Diagnosis not present

## 2019-03-02 DIAGNOSIS — G7001 Myasthenia gravis with (acute) exacerbation: Secondary | ICD-10-CM | POA: Diagnosis not present

## 2019-03-25 ENCOUNTER — Encounter: Payer: Self-pay | Admitting: Family Medicine

## 2019-04-02 ENCOUNTER — Encounter: Payer: Self-pay | Admitting: Family Medicine

## 2019-04-04 ENCOUNTER — Telehealth: Payer: Self-pay

## 2019-04-04 NOTE — Telephone Encounter (Signed)
Called patient and left a detailed voice message to let him know that he needs to set up a virtual or telephone visit to go over medication options with Dr. Elease Hashimoto.   OK for PEC to discuss and/or advise.  CRM Created.

## 2019-04-05 ENCOUNTER — Other Ambulatory Visit: Payer: Self-pay | Admitting: Family Medicine

## 2019-04-05 ENCOUNTER — Other Ambulatory Visit: Payer: Self-pay

## 2019-04-05 ENCOUNTER — Ambulatory Visit (INDEPENDENT_AMBULATORY_CARE_PROVIDER_SITE_OTHER): Payer: Medicare Other | Admitting: Family Medicine

## 2019-04-05 DIAGNOSIS — E1165 Type 2 diabetes mellitus with hyperglycemia: Secondary | ICD-10-CM | POA: Diagnosis not present

## 2019-04-05 DIAGNOSIS — E039 Hypothyroidism, unspecified: Secondary | ICD-10-CM

## 2019-04-05 NOTE — Progress Notes (Signed)
Patient ID: Rodney Martinez, male   DOB: Nov 03, 1950, 68 y.o.   MRN: [id number]  This visit type was conducted due to national recommendations for restrictions regarding the COVID-19 pandemic in an effort to limit this patient's exposure and mitigate transmission in our community.   Virtual Visit via Telephone Note  I connected with Stann Ore on 04/05/19 at  8:30 AM EDT by telephone and verified that I am speaking with the correct person using two identifiers.   I discussed the limitations, risks, security and privacy concerns of performing an evaluation and management service by telephone and the availability of in person appointments. I also discussed with the patient that there may be a patient responsible charge related to this service. The patient expressed understanding and agreed to proceed.  Location patient: home Location provider: work or home office Participants present for the call: patient, provider Patient did not have a visit in the prior 7 days to address this/these issue(s).   History of Present Illness: Patient has history of type 2 diabetes, hypothyroidism, myasthenia gravis, BPH.  His myasthenia is relatively stable.  Currently on prednisone 12 mg daily.  Symptoms have improved compared to last year.  He is followed closely by neurology for that.  Type 2 diabetes.  Last A1c in December was 6.1%.  He takes metformin extended release.  He had consistent diarrhea symptoms with immediate release.  He received a letter from his pharmacy that there was a recall on extended release and there may be a shortage.  He wished to discuss potential alternatives.  Not monitoring blood sugars at home.  No significant polyuria or polydipsia.  He has hypothyroidism and is on low-dose replacement with levothyroxine 25 mcg daily.  Past Medical History:  Diagnosis Date  . Cancer ([place])    skin cancer   . Diabetes mellitus without complication ([place])   . GERD 12/12/2009  . HYPERGLYCEMIA  12/12/2009  . Myasthenia gravis ([place])   . OBSTRUCTIVE SLEEP APNEA 08/14/2010  . Sleep apnea    uses CPAP   . Thyroid disease   . TRANSAMINASES, SERUM, ELEVATED 12/12/2009   Past Surgical History:  Procedure Laterality Date  . BASAL CELL CARCINOMA EXCISION  2007   face (Mohs)  . COLONOSCOPY  04/15/2012  . HERNIA REPAIR  [id number]   umbilical  . POLYPECTOMY      reports that he has been smoking cigars. He has smoked for the past 10.00 years. He has never used smokeless tobacco. He reports previous alcohol use. He reports that he does not use drugs. family history includes Diabetes in his maternal grandfather, mother, paternal grandfather, and paternal grandmother; Heart disease in his father. No Known Allergies     Observations/Objective: Patient sounds cheerful and well on the phone. I do not appreciate any SOB. Speech and thought processing are grossly intact. Patient reported vitals:  Assessment and Plan:  #1 type 2 diabetes.  Good control by lab 6 months ago.  Possible impending shortage with metformin extended release as above  -Recheck A1c tomorrow -We discussed possible alternatives if he cannot get extended release metformin including DPP 4, GLP-1, and SGLT2 classes  #2 hypothyroidism -Recheck TSH tomorrow  #3 myasthenia gravis-symptomatically stable  Follow Up Instructions:  -Tomorrow for labs as above   99441 5-10 99442 11-20 9443 21-30 I did not refer this patient for an OV in the next 24 hours for this/these issue(s).  I discussed the assessment and treatment plan with the patient. The patient was provided  an opportunity to ask questions and all were answered. The patient agreed with the plan and demonstrated an understanding of the instructions.   The patient was advised to call back or seek an in-person evaluation if the symptoms worsen or if the condition fails to improve as anticipated.  I provided 16 minutes of non-face-to-face time during this  encounter.   [name], MD

## 2019-04-06 ENCOUNTER — Other Ambulatory Visit (INDEPENDENT_AMBULATORY_CARE_PROVIDER_SITE_OTHER): Payer: Medicare Other

## 2019-04-06 ENCOUNTER — Other Ambulatory Visit: Payer: Self-pay

## 2019-04-06 DIAGNOSIS — E1165 Type 2 diabetes mellitus with hyperglycemia: Secondary | ICD-10-CM | POA: Diagnosis not present

## 2019-04-06 LAB — HEMOGLOBIN A1C: Hgb A1c MFr Bld: 7.8 % — ABNORMAL HIGH (ref 4.6–6.5)

## 2019-04-15 NOTE — Progress Notes (Signed)
Mycophenolate prior authorization submitted via covermymeds.  Waiting response.  KEY :[id number]

## 2019-04-18 ENCOUNTER — Other Ambulatory Visit (HOSPITAL_COMMUNITY): Payer: Self-pay | Admitting: Emergency Medicine

## 2019-04-18 DIAGNOSIS — D696 Thrombocytopenia, unspecified: Secondary | ICD-10-CM

## 2019-04-18 NOTE — Progress Notes (Signed)
Mycophenolate 500mg  received from [facility] or [facility].

## 2019-04-18 NOTE — Progress Notes (Unsigned)
Fax received from home infusion needing clinical information for IVIG.  Clinicals faxed to [phone].

## 2019-04-19 ENCOUNTER — Other Ambulatory Visit: Payer: Self-pay

## 2019-04-19 ENCOUNTER — Inpatient Hospital Stay (HOSPITAL_COMMUNITY): Payer: Medicare Other | Attending: Hematology

## 2019-04-19 DIAGNOSIS — E119 Type 2 diabetes mellitus without complications: Secondary | ICD-10-CM | POA: Diagnosis not present

## 2019-04-19 DIAGNOSIS — K219 Gastro-esophageal reflux disease without esophagitis: Secondary | ICD-10-CM | POA: Insufficient documentation

## 2019-04-19 DIAGNOSIS — G7000 Myasthenia gravis without (acute) exacerbation: Secondary | ICD-10-CM | POA: Diagnosis not present

## 2019-04-19 DIAGNOSIS — Z85828 Personal history of other malignant neoplasm of skin: Secondary | ICD-10-CM | POA: Insufficient documentation

## 2019-04-19 DIAGNOSIS — F17210 Nicotine dependence, cigarettes, uncomplicated: Secondary | ICD-10-CM | POA: Diagnosis not present

## 2019-04-19 DIAGNOSIS — E079 Disorder of thyroid, unspecified: Secondary | ICD-10-CM | POA: Diagnosis not present

## 2019-04-19 DIAGNOSIS — Z79899 Other long term (current) drug therapy: Secondary | ICD-10-CM | POA: Insufficient documentation

## 2019-04-19 DIAGNOSIS — H524 Presbyopia: Secondary | ICD-10-CM | POA: Diagnosis not present

## 2019-04-19 DIAGNOSIS — Z7984 Long term (current) use of oral hypoglycemic drugs: Secondary | ICD-10-CM | POA: Diagnosis not present

## 2019-04-19 DIAGNOSIS — D696 Thrombocytopenia, unspecified: Secondary | ICD-10-CM | POA: Diagnosis not present

## 2019-04-19 DIAGNOSIS — R200 Anesthesia of skin: Secondary | ICD-10-CM | POA: Diagnosis not present

## 2019-04-19 DIAGNOSIS — D72820 Lymphocytosis (symptomatic): Secondary | ICD-10-CM | POA: Insufficient documentation

## 2019-04-19 LAB — CBC WITH DIFFERENTIAL/PLATELET
Abs Immature Granulocytes: 0.05 10*3/uL (ref 0.00–0.07)
Basophils Absolute: 0 10*3/uL (ref 0.0–0.1)
Basophils Relative: 0 %
Eosinophils Absolute: 0 10*3/uL (ref 0.0–0.5)
Eosinophils Relative: 0 %
HCT: 42.8 % (ref 39.0–52.0)
Hemoglobin: 14.2 g/dL (ref 13.0–17.0)
Immature Granulocytes: 0 %
Lymphocytes Relative: 27 %
Lymphs Abs: 3.1 10*3/uL (ref 0.7–4.0)
MCH: 31.9 pg (ref 26.0–34.0)
MCHC: 33.2 g/dL (ref 30.0–36.0)
MCV: 96.2 fL (ref 80.0–100.0)
Monocytes Absolute: 0.5 10*3/uL (ref 0.1–1.0)
Monocytes Relative: 4 %
Neutro Abs: 7.8 10*3/uL — ABNORMAL HIGH (ref 1.7–7.7)
Neutrophils Relative %: 69 %
Platelets: 178 10*3/uL (ref 150–400)
RBC: 4.45 MIL/uL (ref 4.22–5.81)
RDW: 12.8 % (ref 11.5–15.5)
WBC: 11.4 10*3/uL — ABNORMAL HIGH (ref 4.0–10.5)
nRBC: 0 % (ref 0.0–0.2)

## 2019-04-19 LAB — COMPREHENSIVE METABOLIC PANEL
ALT: 45 U/L — ABNORMAL HIGH (ref 0–44)
AST: 31 U/L (ref 15–41)
Albumin: 4.5 g/dL (ref 3.5–5.0)
Alkaline Phosphatase: 48 U/L (ref 38–126)
Anion gap: 11 (ref 5–15)
BUN: 21 mg/dL (ref 8–23)
CO2: 24 mmol/L (ref 22–32)
Calcium: 9.6 mg/dL (ref 8.9–10.3)
Chloride: 103 mmol/L (ref 98–111)
Creatinine, Ser: 1.03 mg/dL (ref 0.61–1.24)
GFR calc Af Amer: >60 mL/min (ref >60)
GFR calc non Af Amer: >60 mL/min (ref >60)
Glucose, Bld: 348 mg/dL — ABNORMAL HIGH (ref 70–99)
Potassium: 5 mmol/L (ref 3.5–5.1)
Sodium: 138 mmol/L (ref 135–145)
Total Bilirubin: 0.6 mg/dL (ref 0.3–1.2)
Total Protein: 7.1 g/dL (ref 6.5–8.1)

## 2019-04-19 LAB — LACTATE DEHYDROGENASE: LDH: 141 U/L (ref 98–192)

## 2019-04-19 MED ORDER — JARDIANCE 10 MG PO TABS: 10.0000 mg | ORAL_TABLET | Freq: Every day | ORAL | 5 refills | Status: DC

## 2019-04-26 ENCOUNTER — Other Ambulatory Visit: Payer: Self-pay

## 2019-04-26 ENCOUNTER — Inpatient Hospital Stay (HOSPITAL_BASED_OUTPATIENT_CLINIC_OR_DEPARTMENT_OTHER): Payer: Medicare Other | Admitting: Hematology

## 2019-04-26 ENCOUNTER — Encounter (HOSPITAL_COMMUNITY): Payer: Self-pay | Admitting: Hematology

## 2019-04-26 VITALS — BP 131/61 | HR 71 | Temp 97.3°F | Resp 18 | Wt 263.0 lb

## 2019-04-26 DIAGNOSIS — Z7984 Long term (current) use of oral hypoglycemic drugs: Secondary | ICD-10-CM | POA: Diagnosis not present

## 2019-04-26 DIAGNOSIS — K219 Gastro-esophageal reflux disease without esophagitis: Secondary | ICD-10-CM

## 2019-04-26 DIAGNOSIS — D72820 Lymphocytosis (symptomatic): Secondary | ICD-10-CM | POA: Insufficient documentation

## 2019-04-26 DIAGNOSIS — Z85828 Personal history of other malignant neoplasm of skin: Secondary | ICD-10-CM | POA: Diagnosis not present

## 2019-04-26 DIAGNOSIS — R200 Anesthesia of skin: Secondary | ICD-10-CM | POA: Diagnosis not present

## 2019-04-26 DIAGNOSIS — Z79899 Other long term (current) drug therapy: Secondary | ICD-10-CM

## 2019-04-26 DIAGNOSIS — F17210 Nicotine dependence, cigarettes, uncomplicated: Secondary | ICD-10-CM

## 2019-04-26 DIAGNOSIS — G7000 Myasthenia gravis without (acute) exacerbation: Secondary | ICD-10-CM

## 2019-04-26 DIAGNOSIS — E079 Disorder of thyroid, unspecified: Secondary | ICD-10-CM | POA: Diagnosis not present

## 2019-04-26 DIAGNOSIS — E119 Type 2 diabetes mellitus without complications: Secondary | ICD-10-CM

## 2019-04-26 DIAGNOSIS — D696 Thrombocytopenia, unspecified: Secondary | ICD-10-CM | POA: Diagnosis not present

## 2019-04-26 NOTE — Assessment & Plan Note (Addendum)
1.  Easy bruising: -Patient has a long history of easy bruising limited to both forearms.  Reports that his father had similar problem.  This has been worse since he was started on prednisone. -Work-up indicated normal PT, PTT and fibrinogen levels.  Von Willebrand screen was normal.  No further testing necessary as he does not have any other bleeding problems.  He does not have any bruising elsewhere on the body.   2.  Monoclonal B-cell lymphocytosis: - Flow cytometry on 05/10/2018 shows CD5 positive, CD10 negative, CD20 positive B-cell lymphocytosis. -He does not have any B symptoms.  He rarely has some night sweats.  No recurrent infections. -Physical examination did not reveal any adenopathy or splenomegaly. -We reviewed his CBC today.  White count is 11.4 with slightly increased neutrophils, likely from prednisone.  Absolute lymphocyte count is normal. -We will see him back in 6 months with repeat blood counts.  3.  Myasthenia gravis: - He was diagnosed with seropositive bulbar myasthenia gravis with difficulty with speech and swallowing. -He is currently on prednisone 12.5 mg daily and CellCept 750 mg twice daily.  His symptoms are well controlled.  4.  Diabetes: -He is on Jardiance 10 mg daily and metformin once daily.

## 2019-04-26 NOTE — Patient Instructions (Signed)
[facility] at [facility] Discharge Instructions  You were seen today by Dr. Katragadda. He went over your recent lab results. He will see you back in 6 months for labs and follow up.   Thank you for choosing [facility] at [facility] to provide your oncology and hematology care.  To afford each patient quality time with our provider, please arrive at least 15 minutes before your scheduled appointment time.   If you have a lab appointment with the Cancer Center please come in thru the  Main Entrance and check in at the main information desk  You need to re-schedule your appointment should you arrive 10 or more minutes late.  We strive to give you quality time with our providers, and arriving late affects you and other patients whose appointments are after yours.  Also, if you no show three or more times for appointments you may be dismissed from the clinic at the providers discretion.     Again, thank you for choosing [facility].  Our hope is that these requests will decrease the amount of time that you wait before being seen by our physicians.       _____________________________________________________________  Should you have questions after your visit to [facility], please contact our office at [phone] between the hours of 8:00 a.m. and 4:30 p.m.  Voicemails left after 4:00 p.m. will not be returned until the following business day.  For prescription refill requests, have your pharmacy contact our office and allow 72 hours.    Cancer Center Support Programs:   > Cancer Support Group  2nd Tuesday of the month 1pm-2pm, Journey Room    

## 2019-04-26 NOTE — Progress Notes (Signed)
Rodney Martinez, [place] [id number]   CLINIC:  Medical Oncology/Hematology  PCP:  [name], MD [place] [address] [phone]   REASON FOR VISIT: Easy bruising of forearms and a monoclonal B-cell lymphocytosis.  CURRENT THERAPY: Prednisone 12.5 mg daily.   INTERVAL HISTORY:  Rodney Martinez 68 y.o. male seen for follow-up of easy bruising of forearms.  He was also diagnosed with monoclonal B-cell lymphocytosis on flow cytometry in August 2019.  Denies any fevers.  Occasional night sweats which are mild.  Denies any weight loss.  He is continuing to be very active outdoors.  He wears close to protect his forearms while working in the garden.  He reports that his bruising is improved overall.  Appetite is 100%.  Energy levels are 75%.  Denies any nausea vomiting or diarrhea.  No palpable lymphadenopathy.   REVIEW OF SYSTEMS:  Review of Systems  Neurological: Positive for numbness.  Hematological: Bruises/bleeds easily.  All other systems reviewed and are negative.    PAST MEDICAL/SURGICAL HISTORY:  Past Medical History:  Diagnosis Date  . Cancer ([place])    skin cancer   . Diabetes mellitus without complication ([place])   . GERD 12/12/2009  . HYPERGLYCEMIA 12/12/2009  . Myasthenia gravis ([place])   . OBSTRUCTIVE SLEEP APNEA 08/14/2010  . Sleep apnea    uses CPAP   . Thyroid disease   . TRANSAMINASES, SERUM, ELEVATED 12/12/2009   Past Surgical History:  Procedure Laterality Date  . BASAL CELL CARCINOMA EXCISION  2007   face (Mohs)  . COLONOSCOPY  04/15/2012  . HERNIA REPAIR  6812   umbilical  . POLYPECTOMY       SOCIAL HISTORY:  Social History   Socioeconomic History  . Marital status: Married    Spouse name: Not on file  . Number of children: 1  . Years of education: vet school  . Highest education level: Professional school degree (e.g., MD, DDS, DVM, JD)  Occupational History  . Occupation: retired  Armed forces operational officer  . Financial resource strain: Not on file  . Food insecurity    Worry: Not on file    Inability: Not on file  . Transportation needs    Medical: Not on file    Non-medical: Not on file  Tobacco Use  . Smoking status: Current Every Day Smoker    Years: 10.00    Types: Cigars  . Smokeless tobacco: Never Used  . Tobacco comment: Pt states that he smokes 6 cigars a day, he smoked cigarettes for 10 years.  Substance and Sexual Activity  . Alcohol use: Not Currently    Comment: rare  . Drug use: No  . Sexual activity: Not on file  Lifestyle  . Physical activity    Days per week: Not on file    Minutes per session: Not on file  . Stress: Not on file  Relationships  . Social Herbalist on phone: Not on file    Gets together: Not on file    Attends religious service: Not on file    Active member of club or organization: Not on file    Attends meetings of clubs or organizations: Not on file    Relationship status: Not on file  . Intimate partner violence    Fear of current or ex partner: Not on file    Emotionally abused: Not on file    Physically abused: Not on  file    Forced sexual activity: Not on file  Other Topics Concern  . Not on file  Social History Narrative   Lives with wife in a 2 story home.  Has one child. Retired Animal nutritionist.      FAMILY HISTORY:  Family History  Problem Relation Age of Onset  . Diabetes Mother        type ll  . Heart disease Father        ?atrial fibrillation  . Diabetes Maternal Grandfather   . Diabetes Paternal Grandmother   . Diabetes Paternal Grandfather   . Colon cancer Neg Hx   . Stomach cancer Neg Hx   . Colon polyps Neg Hx   . Esophageal cancer Neg Hx   . Rectal cancer Neg Hx     CURRENT MEDICATIONS:  Outpatient Encounter Medications as of 04/26/2019  Medication Sig  . ACCU-CHEK FASTCLIX LANCETS MISC Test once daily.  Dx E11.9  . Calcium-Magnesium-Vitamin D (CALCIUM 500 PO) Take by  mouth.  . empagliflozin (JARDIANCE) 10 MG TABS tablet Take 10 mg by mouth daily.  Arna Medici 25 MCG tablet TAKE 1 TABLET BY MOUTH ONCE DAILY BEFORE BREAKFAST  . GAMMAPLEX 5 GM/50ML SOLN   . glucose blood (ACCU-CHEK GUIDE) test strip Test once daily.  Dx e11.9  . Melatonin 5 MG CAPS Take 5 mg by mouth at bedtime as needed (for sleep).   . mycophenolate (CELLCEPT) 500 MG tablet Take 1.5 tablets (750 mg total) by mouth 2 (two) times daily.  Marland Kitchen omeprazole (PRILOSEC) 20 MG capsule Take 20 mg by mouth daily.  . predniSONE (DELTASONE) 5 MG tablet Take 3 tablets (15 mg total) by mouth daily with breakfast.  . pyridostigmine (MESTINON) 60 MG tablet TAKE 1 TABLET BY MOUTH TWO TIMES DAILY  . tamsulosin (FLOMAX) 0.4 MG CAPS capsule Take 1 capsule by mouth once daily  . vitamin C (ASCORBIC ACID) 500 MG tablet Take 500 mg by mouth daily.  . metFORMIN (GLUCOPHAGE-XR) 500 MG 24 hr tablet Take one twice daily. (Patient not taking: Reported on 04/26/2019)   No facility-administered encounter medications on file as of 04/26/2019.     ALLERGIES:  No Known Allergies   PHYSICAL EXAM:  ECOG Performance status: 1  Vitals:   04/26/19 1438  BP: 131/61  Pulse: 71  Resp: 18  Temp: (!) 97.3 F (36.3 C)  SpO2: 97%   Filed Weights   04/26/19 1438  Weight: 263 lb (119.3 kg)    Physical Exam Constitutional:      Appearance: Normal appearance.  Cardiovascular:     Rate and Rhythm: Normal rate and regular rhythm.     Heart sounds: Normal heart sounds.  Pulmonary:     Effort: Pulmonary effort is normal.     Breath sounds: Normal breath sounds.  Abdominal:     General: There is no distension.     Palpations: Abdomen is soft. There is no mass.  Musculoskeletal: Normal range of motion.  Lymphadenopathy:     Cervical: No cervical adenopathy.  Skin:    General: Skin is warm.     Findings: Bruising present.  Neurological:     Mental Status: He is alert and oriented to person, place, and time. Mental  status is at baseline.  Psychiatric:        Mood and Affect: Mood normal.        Behavior: Behavior normal.      LABORATORY DATA:  I have reviewed the labs as listed.  CBC  Component Value Date/Time   WBC 11.4 (H) 04/19/2019 1315   RBC 4.45 04/19/2019 1315   HGB 14.2 04/19/2019 1315   HCT 42.8 04/19/2019 1315   PLT 178 04/19/2019 1315   MCV 96.2 04/19/2019 1315   MCH 31.9 04/19/2019 1315   MCHC 33.2 04/19/2019 1315   RDW 12.8 04/19/2019 1315   LYMPHSABS 3.1 04/19/2019 1315   MONOABS 0.5 04/19/2019 1315   EOSABS 0.0 04/19/2019 1315   BASOSABS 0.0 04/19/2019 1315   CMP Latest Ref Rng & Units 04/19/2019 10/12/2018 09/21/2018  Glucose 70 - 99 mg/dL 348(H) 289(H) 257(H)  BUN 8 - 23 mg/dL 21 22 10   Creatinine 0.61 - 1.24 mg/dL 1.03 0.88 0.88  Sodium 135 - 145 mmol/L 138 137 140  Potassium 3.5 - 5.1 mmol/L 5.0 5.0 4.1  Chloride 98 - 111 mmol/L 103 102 103  CO2 22 - 32 mmol/L 24 23 30   Calcium 8.9 - 10.3 mg/dL 9.6 9.4 9.1  Total Protein 6.5 - 8.1 g/dL 7.1 7.4 -  Total Bilirubin 0.3 - 1.2 mg/dL 0.6 0.6 -  Alkaline Phos 38 - 126 U/L 48 41 -  AST 15 - 41 U/L 31 26 -  ALT 0 - 44 U/L 45(H) 33 -       DIAGNOSTIC IMAGING:  I have independently reviewed the scans and discussed with the patient.   ASSESSMENT & PLAN:   Monoclonal B-cell lymphocytosis 1.  Easy bruising: -Patient has a long history of easy bruising limited to both forearms.  Reports that his father had similar problem.  This has been worse since he was started on prednisone. -Work-up indicated normal PT, PTT and fibrinogen levels.  Von Willebrand screen was normal.  No further testing necessary as he does not have any other bleeding problems.  He does not have any bruising elsewhere on the body.   2.  Monoclonal B-cell lymphocytosis: - Flow cytometry on 05/10/2018 shows CD5 positive, CD10 negative, CD20 positive B-cell lymphocytosis. -He does not have any B symptoms.  He rarely has some night sweats.  No recurrent  infections. -Physical examination did not reveal any adenopathy or splenomegaly. -We reviewed his CBC today.  White count is 11.4 with slightly increased neutrophils, likely from prednisone.  Absolute lymphocyte count is normal. -We will see him back in 6 months with repeat blood counts.  3.  Myasthenia gravis: - He was diagnosed with seropositive bulbar myasthenia gravis with difficulty with speech and swallowing. -He is currently on prednisone 12.5 mg daily and CellCept 750 mg twice daily.  His symptoms are well controlled.  4.  Diabetes: -He is on Jardiance 10 mg daily and metformin once daily.    Total time spent is 25 minutes more than 50% of the time spent face-to-face discussing lab results, surveillance, counseling and coordination of care.     Orders placed this encounter:  Orders Placed This Encounter  Procedures  . CBC with Differential/Platelet  . Comprehensive metabolic panel  . Lactate dehydrogenase      Derek Jack, MD [place] [phone]

## 2019-04-29 ENCOUNTER — Encounter: Payer: Self-pay | Admitting: Neurology

## 2019-05-02 ENCOUNTER — Encounter: Payer: Self-pay | Admitting: Neurology

## 2019-05-02 ENCOUNTER — Ambulatory Visit: Payer: Medicare Other | Admitting: Neurology

## 2019-05-02 ENCOUNTER — Other Ambulatory Visit: Payer: Self-pay

## 2019-05-02 VITALS — BP 128/74 | HR 89 | Ht 71.0 in | Wt 269.0 lb

## 2019-05-02 DIAGNOSIS — G7000 Myasthenia gravis without (acute) exacerbation: Secondary | ICD-10-CM | POA: Diagnosis not present

## 2019-05-02 DIAGNOSIS — Z7952 Long term (current) use of systemic steroids: Secondary | ICD-10-CM

## 2019-05-02 DIAGNOSIS — E538 Deficiency of other specified B group vitamins: Secondary | ICD-10-CM | POA: Diagnosis not present

## 2019-05-02 NOTE — Patient Instructions (Addendum)
Continue prednisone 12.5mg  x 2 weeks, then alternate to 10mg  and 12.5mg  x 1 month, if okay, reduce to 10mg  daily.   Hold IVIG  Continue Cellcept 750mg  twice daily  Continue mestinon 60mg  at 11am and 5pm  Return to clinic in 2 months

## 2019-05-02 NOTE — Progress Notes (Signed)
Follow-up Visit   Date: 05/02/2019    Rodney Martinez MRN: [id number] DOB: 08-Sep-1951   Interim History: Rodney Martinez is a 67 y.o. right-handed Caucasian male with OSA, hypothyroidism, and diabetes mellitus returning to the clinic for follow-up of seropositive myasthenia gravis.  The patient was accompanied to the clinic by self.  History of present illness: Starting in mid January 2018, he developed slurred speech, difficulty swallowing, droopy eyelids, and weakness with moving his tongue. MRI brain on 1/22 did not show evidence of stroke.  There was high clinical suspicion for myasthenia so he was started on mestinon 30mg  three times daily; in the interim, AChR antibodies returned positive.  He saw my colleague, Dr. Tomi Likens initially who started him on prednisone 20mg  and increased mestinon to 60mg  three times daily and is here to establish care with me. Due to lack of improvement, his prednisone was titrated to 60mg  daily and IVIG was started in March 2018. Within a month, his dysphagia and double vision improved.  In May, slow prednisone taper was started.  He was noted to have low platelets (127) and referred to hematology for evaluation, especially since he was also started on Cellcept.  By September he was down to prednisone 20mg  and getting IVIG ever 3 weeks. Over 2019-2020, his IVIG has been tapered to every 8 weeks and prednisone is down to 12.5mg  daily.  His Cellcept was increased to 750mg  BID in March 2020.    UPDATE 05/02/2019: He is here for follow-up visit.  At his last visit, I increased his CellCept to 750 mg twice daily and continue tapering his IVIG every 8 weeks.  He was also able to reduce prednisone to 12.5 mg daily, which he has been on for the past 2 weeks.  He last received IVIG in May 2020, and has been doing well without any double vision, eye droopiness, difficulty swallowing/talking, or limb weakness.  Medications:  Current Outpatient Medications on File Prior to  Visit  Medication Sig Dispense Refill  . ACCU-CHEK FASTCLIX LANCETS MISC Test once daily.  Dx E11.9 102 each 3  . Calcium-Magnesium-Vitamin D (CALCIUM 500 PO) Take by mouth.    . empagliflozin (JARDIANCE) 10 MG TABS tablet Take 10 mg by mouth daily. 30 tablet 5  . EUTHYROX 25 MCG tablet TAKE 1 TABLET BY MOUTH ONCE DAILY BEFORE BREAKFAST 90 tablet 0  . GAMMAPLEX 5 GM/50ML SOLN     . glucose blood (ACCU-CHEK GUIDE) test strip Test once daily.  Dx e11.9 100 each 3  . Melatonin 5 MG CAPS Take 5 mg by mouth at bedtime as needed (for sleep).     . metFORMIN (GLUCOPHAGE-XR) 500 MG 24 hr tablet Take one twice daily. 60 tablet 11  . mycophenolate (CELLCEPT) 500 MG tablet Take 1.5 tablets (750 mg total) by mouth 2 (two) times daily. 270 tablet 3  . omeprazole (PRILOSEC) 20 MG capsule Take 20 mg by mouth daily.    . predniSONE (DELTASONE) 5 MG tablet Take 3 tablets (15 mg total) by mouth daily with breakfast. (Patient taking differently: Take 12.5 mg by mouth daily with breakfast. )    . pyridostigmine (MESTINON) 60 MG tablet TAKE 1 TABLET BY MOUTH TWO TIMES DAILY 180 tablet 1  . tamsulosin (FLOMAX) 0.4 MG CAPS capsule Take 1 capsule by mouth once daily 90 capsule 1  . vitamin C (ASCORBIC ACID) 500 MG tablet Take 500 mg by mouth daily.     No current facility-administered medications on file  prior to visit.     Allergies: No Known Allergies  Review of Systems:  CONSTITUTIONAL: No fevers, chills, night sweats, or weight loss.  EYES: No visual changes or eye pain ENT: No hearing changes.  No history of nose bleeds.   RESPIRATORY: No cough, wheezing and shortness of breath.   CARDIOVASCULAR: Negative for chest pain, and palpitations.   GI: Negative for abdominal discomfort, blood in stools or black stools.  No recent change in bowel habits.   GU:  No history of incontinence.   MUSCLOSKELETAL: No history of joint pain or swelling.  No myalgias.   SKIN: Negative for lesions, rash, and itching.    ENDOCRINE: Negative for cold or heat intolerance, polydipsia or goiter.   PSYCH:  No depression or anxiety symptoms.   NEURO: As Above.   Vital Signs:  BP 128/74   Pulse 89   Ht 5\' 11"  (1.803 m)   Wt 269 lb (122 kg)   SpO2 98%   BMI 37.52 kg/m    General Medical Exam:   General:  Well appearing, comfortable  Eyes/ENT: see cranial nerve examination.   Neck:   No carotid bruits. Respiratory:  Clear to auscultation, good air entry bilaterally.   Cardiac:  Regular rate and rhythm, no murmur.   Ext: Multiple ecchymosis over his arms  Neurological Exam: MENTAL STATUS including orientation to time, place, person, recent and remote memory, attention span and concentration, language, and fund of knowledge is normal.  Speech is improved, no dysarthria.    CRANIAL NERVES:  Pupils equal round and reactive to light. Normal conjugate, extra-ocular eye movements in all directions of gaze. No ptosis at rest or with sustained upgaze.  Face is symmetric.  Facial muscles are 5/5 throughout - Orbicularis oris and oculi is 5/5; buccinator 5/5.  Palate elevates symmetrically.  Tongue is midline, tongue strength is 5/5.    MOTOR:  Motor strength is 5/5 throughout.   COORDINATION/GAIT:  He is able to stand up without pushing off.   Gait narrow based and stable.  Data: AChR binding 1.01*, blocking 61*, modulating 25*  MRI brain wwo contrast 10/27/2017:  1. No acute intracranial process or abnormal enhancement. 2. Mild parenchymal brain volume loss for age.  CT chest w contrast 02/03/2018: No acute chest abnormality. Specifically, no evidence for a mediastinal mass or abnormal thymic tissue. Probable scarring in the left lower lung. Indeterminate 4 mm nodular density in the right middle lobe. No follow-up needed if patient is low-risk. Non-contrast chest CT can be considered in 12 months if patient is high-risk.   Lab Results  Component Value Date   WBC 11.4 (H) 04/19/2019   HGB 14.2 04/19/2019    HCT 42.8 04/19/2019   MCV 96.2 04/19/2019   PLT 178 04/19/2019   Lab Results  Component Value Date   CREATININE 1.03 04/19/2019   BUN 21 04/19/2019   NA 138 04/19/2019   K 5.0 04/19/2019   CL 103 04/19/2019   CO2 24 04/19/2019    IMPRESSION/PLAN: 1. 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  He is doing very well on tapering dose of IVIG and prednisone.  Exam today is normal, without bulbar or limb weakness.  Cellcept started in April 2019 and increased in March 2020.  - Reduce prednisone 12.5mg  x 2 weeks, then alternate to 10mg  and 12.5mg  x 1 month, if okay, reduce to 10mg  daily.   - Continue Cellcept to 750mg  twice daily  - Hold IVIG for now  -  Continue mestinon 60mg  at 11am and 5pm  - CBC and CMP looks stable, recheck at next visit  2.  Long-term corticosteroid use  - Continue calcium supplementation to maintain 1200mg /d and vitamin D 800 IU/d  - Continue pepcid for GI prophylaxis  3.  Vitamin B12 deficiency  - Transition to oral B12 1060mcg daily, check level at next visit   Return to clinic in 2 months   Thank you for allowing me to participate in patient's care.  If I can answer any additional questions, I would be pleased to do so.    Sincerely,    Donika K. Posey Pronto, DO

## 2019-05-06 ENCOUNTER — Ambulatory Visit: Payer: Medicare Other | Admitting: Neurology

## 2019-07-04 DIAGNOSIS — H25043 Posterior subcapsular polar age-related cataract, bilateral: Secondary | ICD-10-CM | POA: Diagnosis not present

## 2019-07-04 DIAGNOSIS — H2513 Age-related nuclear cataract, bilateral: Secondary | ICD-10-CM | POA: Diagnosis not present

## 2019-07-06 ENCOUNTER — Ambulatory Visit: Payer: Medicare Other | Admitting: Neurology

## 2019-07-07 DIAGNOSIS — E538 Deficiency of other specified B group vitamins: Secondary | ICD-10-CM | POA: Diagnosis not present

## 2019-07-07 DIAGNOSIS — G7000 Myasthenia gravis without (acute) exacerbation: Secondary | ICD-10-CM | POA: Diagnosis not present

## 2019-07-07 DIAGNOSIS — Z7952 Long term (current) use of systemic steroids: Secondary | ICD-10-CM | POA: Diagnosis not present

## 2019-07-07 DIAGNOSIS — E1165 Type 2 diabetes mellitus with hyperglycemia: Secondary | ICD-10-CM | POA: Diagnosis not present

## 2019-07-08 ENCOUNTER — Other Ambulatory Visit: Payer: Self-pay

## 2019-07-08 ENCOUNTER — Other Ambulatory Visit (INDEPENDENT_AMBULATORY_CARE_PROVIDER_SITE_OTHER): Payer: Medicare Other

## 2019-07-08 ENCOUNTER — Encounter: Payer: Self-pay | Admitting: Family Medicine

## 2019-07-08 ENCOUNTER — Encounter: Payer: Self-pay | Admitting: Neurology

## 2019-07-08 ENCOUNTER — Ambulatory Visit: Payer: Medicare Other | Admitting: Neurology

## 2019-07-08 VITALS — BP 113/69 | HR 64 | Ht 71.0 in | Wt 262.2 lb

## 2019-07-08 DIAGNOSIS — E538 Deficiency of other specified B group vitamins: Secondary | ICD-10-CM | POA: Diagnosis not present

## 2019-07-08 DIAGNOSIS — E1165 Type 2 diabetes mellitus with hyperglycemia: Secondary | ICD-10-CM | POA: Diagnosis not present

## 2019-07-08 DIAGNOSIS — Z7952 Long term (current) use of systemic steroids: Secondary | ICD-10-CM | POA: Diagnosis not present

## 2019-07-08 DIAGNOSIS — G7000 Myasthenia gravis without (acute) exacerbation: Secondary | ICD-10-CM | POA: Diagnosis not present

## 2019-07-08 LAB — COMPREHENSIVE METABOLIC PANEL
ALT: 54 U/L — ABNORMAL HIGH (ref 0–53)
AST: 30 U/L (ref 0–37)
Albumin: 4.7 g/dL (ref 3.5–5.2)
Alkaline Phosphatase: 47 U/L (ref 39–117)
BUN: 28 mg/dL — ABNORMAL HIGH (ref 6–23)
CO2: 25 mEq/L (ref 19–32)
Calcium: 9.6 mg/dL (ref 8.4–10.5)
Chloride: 104 mEq/L (ref 96–112)
Creatinine, Ser: 0.92 mg/dL (ref 0.40–1.50)
GFR: 81.16 mL/min (ref >60.00)
Glucose, Bld: 184 mg/dL — ABNORMAL HIGH (ref 70–99)
Potassium: 4.5 mEq/L (ref 3.5–5.1)
Sodium: 138 mEq/L (ref 135–145)
Total Bilirubin: 0.4 mg/dL (ref 0.2–1.2)
Total Protein: 7.3 g/dL (ref 6.0–8.3)

## 2019-07-08 LAB — HEMOGLOBIN A1C: Hgb A1c MFr Bld: 8 % — ABNORMAL HIGH (ref 4.6–6.5)

## 2019-07-08 LAB — VITAMIN B12: Vitamin B-12: 498 pg/mL (ref 211–911)

## 2019-07-08 MED ORDER — PREDNISONE 5 MG PO TABS: 10.0000 mg | ORAL_TABLET | Freq: Every day | ORAL | Status: DC

## 2019-07-08 NOTE — Progress Notes (Signed)
Follow-up Visit   Date: 07/08/2019    Rodney Martinez MRN: [id number] DOB: 27-Jul-1951   Interim History: Rodney Martinez is a 68 y.o. right-handed Caucasian male with OSA, hypothyroidism, and diabetes mellitus returning to the clinic for follow-up of seropositive myasthenia gravis.  The patient was accompanied to the clinic by self.  History of present illness: Starting in mid January 2018, he developed slurred speech, difficulty swallowing, droopy eyelids, and weakness with moving his tongue. MRI brain on 1/22 did not show evidence of stroke.  There was high clinical suspicion for myasthenia so he was started on mestinon 30mg  three times daily; in the interim, AChR antibodies returned positive.  He saw my colleague, Dr. Tomi Likens initially who started him on prednisone 20mg  and increased mestinon to 60mg  three times daily and is here to establish care with me. Due to lack of improvement, his prednisone was titrated to 60mg  daily and IVIG was started in March 2018. Within a month, his dysphagia and double vision improved.  In May, slow prednisone taper was started.  He was noted to have low platelets (127) and referred to hematology for evaluation, especially since he was also started on Cellcept.  By September he was down to prednisone 20mg  and getting IVIG ever 3 weeks. Over 2019-2020, his IVIG has been tapered to every 8 weeks and prednisone is down to 12.5mg  daily.  His Cellcept was increased to 750mg  BID in March 2020.    Prior he is here for follow-up visit.  At his last visit, I increased his CellCept to 750 mg twice daily and continue tapering his IVIG every 8 weeks.  He was also able to reduce prednisone to 12.5 mg daily, which he has been on for the past 2 weeks.  He last received IVIG in May 2020  UPDATE 07/08/2019: He is here for follow-up visit.  He has been tapering his prednisone without having any breakthrough weakness and just finished a month of 12.5 mg alternating with 10 mg  daily.  He remains compliant with CellCept 750mg  twice daily and Mestinon 60 mg twice daily.  He denies any double vision, ptosis, difficulty swallowing or talking, or limb weakness.  Medications:  Current Outpatient Medications on File Prior to Visit  Medication Sig Dispense Refill  . ACCU-CHEK FASTCLIX LANCETS MISC Test once daily.  Dx E11.9 102 each 3  . Calcium-Magnesium-Vitamin D (CALCIUM 500 PO) Take by mouth.    . empagliflozin (JARDIANCE) 10 MG TABS tablet Take 10 mg by mouth daily. 30 tablet 5  . EUTHYROX 25 MCG tablet TAKE 1 TABLET BY MOUTH ONCE DAILY BEFORE BREAKFAST 90 tablet 0  . GAMMAPLEX 5 GM/50ML SOLN     . glucose blood (ACCU-CHEK GUIDE) test strip Test once daily.  Dx e11.9 100 each 3  . Melatonin 5 MG CAPS Take 5 mg by mouth at bedtime as needed (for sleep).     . metFORMIN (GLUCOPHAGE-XR) 500 MG 24 hr tablet Take one twice daily. 60 tablet 11  . mycophenolate (CELLCEPT) 500 MG tablet Take 1.5 tablets (750 mg total) by mouth 2 (two) times daily. 270 tablet 3  . omeprazole (PRILOSEC) 20 MG capsule Take 20 mg by mouth daily.    . predniSONE (DELTASONE) 5 MG tablet Take 3 tablets (15 mg total) by mouth daily with breakfast. (Patient taking differently: Take 12.5 mg by mouth daily with breakfast. )    . pyridostigmine (MESTINON) 60 MG tablet TAKE 1 TABLET BY MOUTH TWO TIMES DAILY  180 tablet 1  . tamsulosin (FLOMAX) 0.4 MG CAPS capsule Take 1 capsule by mouth once daily 90 capsule 1  . vitamin C (ASCORBIC ACID) 500 MG tablet Take 500 mg by mouth daily.     No current facility-administered medications on file prior to visit.     Allergies: No Known Allergies  Review of Systems:  CONSTITUTIONAL: No fevers, chills, night sweats, or weight loss.  EYES: No visual changes or eye pain ENT: No hearing changes.  No history of nose bleeds.   RESPIRATORY: No cough, wheezing and shortness of breath.   CARDIOVASCULAR: Negative for chest pain, and palpitations.   GI: Negative for  abdominal discomfort, blood in stools or black stools.  No recent change in bowel habits.   GU:  No history of incontinence.   MUSCLOSKELETAL: No history of joint pain or swelling.  No myalgias.   SKIN: Negative for lesions, rash, and itching.   ENDOCRINE: Negative for cold or heat intolerance, polydipsia or goiter.   PSYCH:  No depression or anxiety symptoms.   NEURO: As Above.   Vital Signs:  BP 113/69   Pulse 64   Ht 5\' 11"  (1.803 m)   Wt 262 lb 3.2 oz (118.9 kg)   SpO2 99%   BMI 36.57 kg/m    General Medical Exam:   General:  Well appearing, comfortable  Ext: Multiple ecchymosis over his arms  Neurological Exam: MENTAL STATUS including orientation to time, place, person, recent and remote memory, attention span and concentration, language, and fund of knowledge is normal.  Speech is improved, no dysarthria.    CRANIAL NERVES:  Pupils equal round and reactive to light. Normal conjugate, extra-ocular eye movements in all directions of gaze. No ptosis at rest or with sustained upgaze.  Face is symmetric.  Facial muscles are 5/5 throughout - Orbicularis oris and oculi is 5/5; buccinator 5/5.  Palate elevates symmetrically.  Tongue is midline, tongue strength is 5/5.    MOTOR:  Motor strength is 5/5 throughout.   COORDINATION/GAIT:  He is able to stand up without pushing off.   Gait narrow based and stable.  Data: AChR binding 1.01*, blocking 61*, modulating 25*  MRI brain wwo contrast 10/27/2017:  1. No acute intracranial process or abnormal enhancement. 2. Mild parenchymal brain volume loss for age.  CT chest w contrast 02/03/2018: No acute chest abnormality. Specifically, no evidence for a mediastinal mass or abnormal thymic tissue. Probable scarring in the left lower lung. Indeterminate 4 mm nodular density in the right middle lobe. No follow-up needed if patient is low-risk. Non-contrast chest CT can be considered in 12 months if patient is high-risk.   Lab Results   Component Value Date   WBC 11.4 (H) 04/19/2019   HGB 14.2 04/19/2019   HCT 42.8 04/19/2019   MCV 96.2 04/19/2019   PLT 178 04/19/2019   Lab Results  Component Value Date   CREATININE 1.03 04/19/2019   BUN 21 04/19/2019   NA 138 04/19/2019   K 5.0 04/19/2019   CL 103 04/19/2019   CO2 24 04/19/2019    IMPRESSION/PLAN: 1. 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  He is doing very well on tapering dose of IVIG and prednisone.  Exam today is normal, without bulbar or limb weakness.  Cellcept started in April 2019 and increased in March 2020.  - Reduce prednisone to 10mg  daily x 1 month, then alternate with 10mg  and 7.5 -stay on this dose until seen in the clinic  -  Continue Cellcept to 750mg  twice daily  - Continue mestinon 60mg  at 11am and 5pm  - Check CBC and CMP today  2.  Long-term corticosteroid use  - Continue calcium supplementation to maintain 1200mg /d and vitamin D 800 IU/d  - Continue pepcid for GI prophylaxis  - Check HbA1c  3.  Vitamin B12 deficiency  - Check vitamin B12  - Continue oral B12 1070mcg daily  Return to clinic in 2 months  Greater than 50% of this 20 minute visit was spent in counseling, explanation of diagnosis, planning of further management, and coordination of care.   Thank you for allowing me to participate in patient's care.  If I can answer any additional questions, I would be pleased to do so.    Sincerely,    Donika K. Posey Pronto, DO

## 2019-07-08 NOTE — Patient Instructions (Addendum)
Reduce prednisone to 10mg  daily x 1 month, then alternate with 10mg  and 7.5mg  daily until you see me again  Check labs  Return to clinic in 3 months  Your provider has requested that you have labwork completed today. Please go to [facility] ([id number]) on the second floor of this building before leaving the office today. You do not need to check in. If you are not called within 15 minutes please check with the front desk.

## 2019-07-09 LAB — CBC WITH DIFFERENTIAL
Basophils Absolute: 0 10*3/uL (ref 0.0–0.2)
Basos: 0 %
EOS (ABSOLUTE): 0 10*3/uL (ref 0.0–0.4)
Eos: 0 %
Hematocrit: 45.9 % (ref 37.5–51.0)
Hemoglobin: 15.6 g/dL (ref 13.0–17.7)
Immature Grans (Abs): 0 10*3/uL (ref 0.0–0.1)
Immature Granulocytes: 0 %
Lymphocytes Absolute: 3.3 10*3/uL — ABNORMAL HIGH (ref 0.7–3.1)
Lymphs: 32 %
MCH: 31.8 pg (ref 26.6–33.0)
MCHC: 34 g/dL (ref 31.5–35.7)
MCV: 94 fL (ref 79–97)
Monocytes Absolute: 0.5 10*3/uL (ref 0.1–0.9)
Monocytes: 5 %
Neutrophils Absolute: 6.4 10*3/uL (ref 1.4–7.0)
Neutrophils: 63 %
RBC: 4.91 x10E6/uL (ref 4.14–5.80)
RDW: 12.4 % (ref 11.6–15.4)
WBC: 10.3 10*3/uL (ref 3.4–10.8)

## 2019-07-13 ENCOUNTER — Telehealth: Payer: Self-pay | Admitting: *Deleted

## 2019-07-13 ENCOUNTER — Encounter: Payer: Self-pay | Admitting: Family Medicine

## 2019-07-13 NOTE — Telephone Encounter (Signed)
Copied from [place] [id number]. Topic: General - Other >> Jul 13, 2019  4:31 PM Celene Kras A wrote: Reason for CRM: Pt returning call to [facility]. Please advise.  Spoke with patient. Patient scheduled for Friday 07/15/2019 at [place].

## 2019-07-15 ENCOUNTER — Encounter: Payer: Self-pay | Admitting: Family Medicine

## 2019-07-15 ENCOUNTER — Ambulatory Visit: Payer: Medicare Other | Admitting: Family Medicine

## 2019-07-15 ENCOUNTER — Other Ambulatory Visit: Payer: Self-pay

## 2019-07-15 VITALS — BP 126/62 | HR 63 | Temp 97.3°F | Wt 264.8 lb

## 2019-07-15 DIAGNOSIS — G7001 Myasthenia gravis with (acute) exacerbation: Secondary | ICD-10-CM

## 2019-07-15 DIAGNOSIS — Z23 Encounter for immunization: Secondary | ICD-10-CM | POA: Diagnosis not present

## 2019-07-15 DIAGNOSIS — R3915 Urgency of urination: Secondary | ICD-10-CM | POA: Diagnosis not present

## 2019-07-15 DIAGNOSIS — E1165 Type 2 diabetes mellitus with hyperglycemia: Secondary | ICD-10-CM | POA: Diagnosis not present

## 2019-07-15 LAB — MICROALBUMIN / CREATININE URINE RATIO
Creatinine,U: 62.6 mg/dL
Microalb Creat Ratio: 1.1 mg/g (ref 0.0–30.0)
Microalb, Ur: <0.7 mg/dL (ref 0.0–1.9)

## 2019-07-15 MED ORDER — METFORMIN HCL ER 500 MG PO TB24: ORAL_TABLET | ORAL | 3 refills | Status: DC

## 2019-07-15 NOTE — Patient Instructions (Signed)
Get back on the Metformin twice daily.    Let's plan on 3 month follow up.

## 2019-07-15 NOTE — Telephone Encounter (Signed)
Pt had appt today for [facility]

## 2019-07-15 NOTE — Progress Notes (Signed)
Subjective:     Patient ID: Rodney Martinez, male   DOB: December 23, 1950, 68 y.o.   MRN: [id number]  HPI Patient has chronic problems including obesity, obstructive sleep apnea, type 2 diabetes, hypothyroidism, myasthenia gravis, BPH.  He remains on prednisone 10 mg daily along with Mestinon for his myasthenia.  His symptoms are relatively stable.  He is in process of trying to taper back prednisone slowly at guidance of neurology.  Type 2 diabetes with recent poor control.  He had recent A1c of 8.0%.  He is on Jardiance 10 mg daily.  We thought he was on metformin extended release but apparently he was confused and thought when he started Jardiance that he should stop the metformin.  He had recent eye exam just a week ago.  He has not had any recent urine microalbumin screen.  Denies any recent chest pains or dizziness.  He has occasional slurring of speech related to the myasthenia and this usually occurs late at night.  His blood pressures have been very stable.  He has history of BPH and takes Flomax.  No major obstructive symptoms.  He does have some urine urgency and occasional mild leakage.  He attributes this to the urgency.  No consistent stress incontinence  Past Medical History:  Diagnosis Date  . Cancer ([place])    skin cancer   . Diabetes mellitus without complication ([place])   . GERD 12/12/2009  . HYPERGLYCEMIA 12/12/2009  . Myasthenia gravis ([place])   . OBSTRUCTIVE SLEEP APNEA 08/14/2010  . Sleep apnea    uses CPAP   . Thyroid disease   . TRANSAMINASES, SERUM, ELEVATED 12/12/2009   Past Surgical History:  Procedure Laterality Date  . BASAL CELL CARCINOMA EXCISION  2007   face (Mohs)  . COLONOSCOPY  04/15/2012  . HERNIA REPAIR  [id number]   umbilical  . POLYPECTOMY      reports that he has been smoking cigars. He has smoked for the past 10.00 years. He has never used smokeless tobacco. He reports previous alcohol use. He reports that he does not use drugs. family history includes Diabetes  in his brother, maternal grandfather, mother, paternal grandfather, and paternal grandmother; Healthy in his daughter and sister; Heart disease in his father. No Known Allergies   Review of Systems  Constitutional: Negative for fever.  Eyes: Negative for visual disturbance.  Respiratory: Negative for cough, chest tightness and shortness of breath.   Cardiovascular: Negative for chest pain, palpitations and leg swelling.  Endocrine: Negative for polydipsia and polyuria.  Genitourinary: Positive for urgency.  Neurological: Negative for dizziness, syncope, weakness, light-headedness and headaches.       Objective:   Physical Exam Constitutional:      Appearance: Normal appearance.  Cardiovascular:     Rate and Rhythm: Normal rate and regular rhythm.  Pulmonary:     Effort: Pulmonary effort is normal.     Breath sounds: Normal breath sounds. No wheezing or rales.  Musculoskeletal:     Right lower leg: No edema.     Left lower leg: No edema.  Skin:    Comments: Feet reveal no skin lesions. Good distal foot pulses. Good capillary refill. No calluses. Normal sensation with monofilament testing   Neurological:     Mental Status: He is alert.        Assessment:     #1 type 2 diabetes poorly controlled with recent A1c 8.0%.  Blood sugar exacerbated by prednisone.  Also, patient not taking metformin and we thought  he was on combination of metformin and Januvia  #2 urinary urgency  #3 myasthenia gravis currently treated with Mestinon and low-dose prednisone    Plan:     -Get back on metformin extended release 500 mg twice daily and continue Jardiance and recommend 39-month follow-up -Flu vaccine and Pneumovax given -Check urine micro-albumin -Continue with yearly eye exams -We need to check fasting lipid at follow-up in 3 months.  Discuss possible statin use at that time-although it appears his lipids have been well controlled the past. -We discussed options for urinary urgency  such as oxybutynin but at this point he wishes to wait  [name] MD [facility]

## 2019-07-24 ENCOUNTER — Other Ambulatory Visit: Payer: Self-pay | Admitting: Family Medicine

## 2019-08-01 ENCOUNTER — Encounter: Payer: Self-pay | Admitting: *Deleted

## 2019-08-01 NOTE — Progress Notes (Signed)
Request from [facility] for H&P/lab work/last office note sent to requested fax # of [phone].

## 2019-08-07 HISTORY — PX: CATARACT EXTRACTION: SUR2

## 2019-08-09 DIAGNOSIS — H25041 Posterior subcapsular polar age-related cataract, right eye: Secondary | ICD-10-CM | POA: Diagnosis not present

## 2019-08-09 DIAGNOSIS — H25811 Combined forms of age-related cataract, right eye: Secondary | ICD-10-CM | POA: Diagnosis not present

## 2019-08-09 DIAGNOSIS — H52202 Unspecified astigmatism, left eye: Secondary | ICD-10-CM | POA: Diagnosis not present

## 2019-08-09 DIAGNOSIS — H2511 Age-related nuclear cataract, right eye: Secondary | ICD-10-CM | POA: Diagnosis not present

## 2019-08-18 ENCOUNTER — Other Ambulatory Visit: Payer: Self-pay | Admitting: Neurology

## 2019-09-19 ENCOUNTER — Encounter: Payer: Self-pay | Admitting: Family Medicine

## 2019-09-23 ENCOUNTER — Telehealth: Payer: Self-pay

## 2019-09-23 NOTE — Telephone Encounter (Signed)
Copied from [place] [id number]. Topic: General - Inquiry >> Sep 23, 2019  8:59 AM Richardo Priest, NT wrote: Reason for CRM: Baxter Flattery with [facility] called in stating she will be sending a fax to see if pt has ever been tried on any statin therapy or medication. Please advise.

## 2019-10-10 ENCOUNTER — Encounter: Payer: Self-pay | Admitting: Neurology

## 2019-10-10 ENCOUNTER — Telehealth (INDEPENDENT_AMBULATORY_CARE_PROVIDER_SITE_OTHER): Payer: Medicare Other | Admitting: Neurology

## 2019-10-10 ENCOUNTER — Other Ambulatory Visit: Payer: Self-pay

## 2019-10-10 VITALS — Ht 71.0 in | Wt 250.0 lb

## 2019-10-10 DIAGNOSIS — G7000 Myasthenia gravis without (acute) exacerbation: Secondary | ICD-10-CM

## 2019-10-10 DIAGNOSIS — Z7952 Long term (current) use of systemic steroids: Secondary | ICD-10-CM

## 2019-10-10 DIAGNOSIS — E538 Deficiency of other specified B group vitamins: Secondary | ICD-10-CM

## 2019-10-10 MED ORDER — PYRIDOSTIGMINE BROMIDE 60 MG PO TABS: ORAL_TABLET | ORAL | 3 refills | Status: DC

## 2019-10-10 MED ORDER — PREDNISONE 5 MG PO TABS: 7.5000 mg | ORAL_TABLET | Freq: Every day | ORAL | 3 refills | Status: DC

## 2019-10-10 NOTE — Progress Notes (Signed)
   Due to the COVID-19 crisis, this virtual check-in visit was done via telephone from my office and it was initiated and consent given by this patient and or family.   Virtual Check in Visit The purpose of this virtual visit is to provide medical care while limiting exposure to the novel coronavirus.    Consent was obtained for virtual check in visit and initiated by pt/family:  Yes.   Answered questions that patient had about telehealth interaction:  Yes.   I discussed the limitations, risks, security and privacy concerns of performing an evaluation and management service by telephone. I also discussed with the patient that there may be a patient responsible charge related to this service. The patient expressed understanding and agreed to proceed.  Pt location: Home Physician Location: office Name of referring provider:  [name], MD I connected with .Rodney Martinez at patients initiation/request on 10/10/2019 at 10:50 AM EST by telephone and verified that I am speaking with the correct person using two identifiers.  Pt MRN:  [id number] Pt DOB:  [birth year]   History of Present Illness: This is a 69 year old man follow-up of seropositive myasthenia gravis.  He has been off IVIG since May 2020 and on a tapering schedule of prednisone.  For the past 2 months, he has been on alternating dose of 10 mg and 7.5 mg and done relatively well.  He has noticed mild dysarthria at the end of the day when he has overexerted himself, this occurs once every few weeks.  He notices that Mestinon 60 mg weekly helps the symptoms.  Otherwise, he denies any double vision, ptosis, difficulty swallowing, shortness of breath.     Observations/Objective:   Vitals:   10/10/19 1042  Weight: 250 lb (113.4 kg)  Height: 5\' 11"  (1.803 m)  Speech is clear and coherent  Assessment and Plan:   1.  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  He was initially treated with  IVIG which has been discontinued as of May 2020.  He is currently on a prednisone taper and tolerating this well.  CellCept was started in April 2019 and increased to his current dose in March 2020.  -Reduce prednisone to 7.5 mg daily  -Continue CellCept 750 mg twice daily, this can be increased to 1000 mg twice daily if needed  -Continue Mestinon 60 mg at 11 AM and 5 PM.  OK to take extra dose as needed  -Check CBC and CMP at next visit  2.  Long-term corticosteroid use  -Continue calcium and vitamin D  -Continue Pepcid for GI prophylaxis  3.  Vitamin B12 deficiency, most recent B12 level is normal  -Continue oral vitamin B12 1000 mcg daily  Need for in person visit now:  No.   Follow Up Instructions:   I discussed the assessment and treatment plan with the patient. The patient was provided an opportunity to ask questions and all were answered. The patient agreed with the plan and demonstrated an understanding of the instructions.   The patient was advised to call back or seek an in-person evaluation if the symptoms worsen or if the condition fails to improve as anticipated.  Return to clinic in 2-3 months  Total Time spent in visit with the patient was:  15 min, of which 100% of the time was spent in counseling and/or coordinating care.   Pt understands and agrees with the plan of care outlined.     Alda Berthold, DO

## 2019-10-18 DIAGNOSIS — H52202 Unspecified astigmatism, left eye: Secondary | ICD-10-CM | POA: Diagnosis not present

## 2019-10-18 DIAGNOSIS — H25812 Combined forms of age-related cataract, left eye: Secondary | ICD-10-CM | POA: Diagnosis not present

## 2019-10-18 DIAGNOSIS — H2512 Age-related nuclear cataract, left eye: Secondary | ICD-10-CM | POA: Diagnosis not present

## 2019-10-18 DIAGNOSIS — H25042 Posterior subcapsular polar age-related cataract, left eye: Secondary | ICD-10-CM | POA: Diagnosis not present

## 2019-10-20 ENCOUNTER — Other Ambulatory Visit: Payer: Self-pay | Admitting: Family Medicine

## 2019-10-24 ENCOUNTER — Encounter: Payer: Self-pay | Admitting: Family Medicine

## 2019-10-25 ENCOUNTER — Encounter: Payer: Self-pay | Admitting: Family Medicine

## 2019-10-25 ENCOUNTER — Other Ambulatory Visit: Payer: Self-pay

## 2019-10-25 ENCOUNTER — Ambulatory Visit: Payer: Medicare Other | Admitting: Family Medicine

## 2019-10-25 VITALS — BP 124/72 | HR 63 | Temp 97.8°F | Ht 71.0 in | Wt 260.9 lb

## 2019-10-25 DIAGNOSIS — E1165 Type 2 diabetes mellitus with hyperglycemia: Secondary | ICD-10-CM

## 2019-10-25 DIAGNOSIS — R3915 Urgency of urination: Secondary | ICD-10-CM

## 2019-10-25 LAB — POCT GLYCOSYLATED HEMOGLOBIN (HGB A1C): Hemoglobin A1C: 7.2 % — AB (ref 4.0–5.6)

## 2019-10-25 MED ORDER — MIRABEGRON ER 25 MG PO TB24: 25.0000 mg | ORAL_TABLET | Freq: Every day | ORAL | 5 refills | Status: DC

## 2019-10-25 MED ORDER — JARDIANCE 10 MG PO TABS: 10.0000 mg | ORAL_TABLET | Freq: Every day | ORAL | 3 refills | Status: DC

## 2019-10-25 NOTE — Patient Instructions (Signed)

## 2019-10-25 NOTE — Progress Notes (Signed)
Subjective:     Patient ID: Rodney Martinez, male   DOB: 29-Sep-1951, 69 y.o.   MRN: [id number]  HPI Rodney Martinez has chronic problems including obesity, obstructive sleep apnea, GERD, type 2 diabetes, myasthenia gravis, history of BPH.  His myasthenia is doing very well overall.  He has been tapering off prednisone and is currently on 7.5 mg.  Blood sugars have been poorly controlled.  Last A1c was 8.0%.  He had been started on Jardiance but had not been taking his Metformin.  He is now back on both medications.  No polydipsia.  A1c improved today to 7.2%  One of his main issues as he has some urine urgency.  He has had more obstructive type symptoms in the past which seem to improved with Flomax.  He does not particularly get up a lot at night but has more daytime symptoms of urgency.  He drinks about 2 cups of coffee in the morning.  Sometimes has some mild leakage with not getting to the bathroom in time.  Past Medical History:  Diagnosis Date  . Cancer ([place])    skin cancer   . Cataract 2020, 2021   Both eyes  . Diabetes mellitus without complication ([place])   . GERD 12/12/2009  . HYPERGLYCEMIA 12/12/2009  . Myasthenia gravis ([place])   . OBSTRUCTIVE SLEEP APNEA 08/14/2010  . Sleep apnea    uses CPAP   . Thyroid disease   . TRANSAMINASES, SERUM, ELEVATED 12/12/2009   Past Surgical History:  Procedure Laterality Date  . BASAL CELL CARCINOMA EXCISION  2007   face (Mohs)  . CATARACT EXTRACTION Right 08/2019  . COLONOSCOPY  04/15/2012  . HERNIA REPAIR  [id number]   umbilical  . POLYPECTOMY      reports that he has been smoking cigars. He has smoked for the past 10.00 years. He has never used smokeless tobacco. He reports previous alcohol use. He reports that he does not use drugs. family history includes Diabetes in his brother, maternal grandfather, mother, paternal grandfather, and paternal grandmother; Healthy in his daughter and sister; Heart disease in his father. No Known Allergies   Review  of Systems  Constitutional: Negative for appetite change, chills, fever and unexpected weight change.  Respiratory: Negative for cough.   Cardiovascular: Negative for chest pain.  Gastrointestinal: Negative for abdominal pain.  Genitourinary: Positive for frequency and urgency. Negative for difficulty urinating, dysuria and hematuria.       Objective:   Physical Exam Vitals and nursing note reviewed.  Constitutional:      Appearance: Normal appearance.  Cardiovascular:     Rate and Rhythm: Normal rate and regular rhythm.  Neurological:     General: No focal deficit present.     Mental Status: He is alert.     Cranial Nerves: No cranial nerve deficit.        Assessment:     #1 type 2 diabetes improving with A1c 7.2% on combination therapy with Jardiance and Metformin.  This will hopefully continue to improve as he tapers down on prednisone  #2 urinary urgency.    Plan:     -Discussed use of statins and type II diabetic regardless of lipid level.  He declines at this time but will consider.  He is getting labs tomorrow through hematology and will see if they can add on lipid panel  -Refilled Jardiance for 1 year  -Continue current diabetes medications  -Continue to minimize caffeine use.  -Discussed consideration for trial of Myrbetriq  25 mg once daily for his urinary urgency symptoms.  Would definitely try to avoid medications with increased anticholinergic side effects  -Routine follow-up in 3 months and sooner as needed  Rodney Post MD [facility]

## 2019-10-27 ENCOUNTER — Inpatient Hospital Stay (HOSPITAL_COMMUNITY): Payer: Medicare Other | Attending: Hematology | Admitting: Hematology

## 2019-10-27 ENCOUNTER — Inpatient Hospital Stay (HOSPITAL_COMMUNITY): Payer: Medicare Other

## 2019-10-27 ENCOUNTER — Encounter (HOSPITAL_COMMUNITY): Payer: Self-pay | Admitting: Hematology

## 2019-10-27 ENCOUNTER — Other Ambulatory Visit: Payer: Self-pay | Admitting: Family Medicine

## 2019-10-27 ENCOUNTER — Other Ambulatory Visit: Payer: Self-pay

## 2019-10-27 VITALS — BP 126/66 | HR 83 | Temp 97.5°F | Resp 18 | Wt 265.0 lb

## 2019-10-27 DIAGNOSIS — G7000 Myasthenia gravis without (acute) exacerbation: Secondary | ICD-10-CM | POA: Diagnosis not present

## 2019-10-27 DIAGNOSIS — Z85828 Personal history of other malignant neoplasm of skin: Secondary | ICD-10-CM | POA: Insufficient documentation

## 2019-10-27 DIAGNOSIS — K219 Gastro-esophageal reflux disease without esophagitis: Secondary | ICD-10-CM | POA: Insufficient documentation

## 2019-10-27 DIAGNOSIS — G4730 Sleep apnea, unspecified: Secondary | ICD-10-CM | POA: Diagnosis not present

## 2019-10-27 DIAGNOSIS — E079 Disorder of thyroid, unspecified: Secondary | ICD-10-CM | POA: Diagnosis not present

## 2019-10-27 DIAGNOSIS — D696 Thrombocytopenia, unspecified: Secondary | ICD-10-CM

## 2019-10-27 DIAGNOSIS — Z79899 Other long term (current) drug therapy: Secondary | ICD-10-CM | POA: Diagnosis not present

## 2019-10-27 DIAGNOSIS — E119 Type 2 diabetes mellitus without complications: Secondary | ICD-10-CM | POA: Insufficient documentation

## 2019-10-27 DIAGNOSIS — D72820 Lymphocytosis (symptomatic): Secondary | ICD-10-CM

## 2019-10-27 LAB — COMPREHENSIVE METABOLIC PANEL
ALT: 64 U/L — ABNORMAL HIGH (ref 0–44)
AST: 44 U/L — ABNORMAL HIGH (ref 15–41)
Albumin: 4.5 g/dL (ref 3.5–5.0)
Alkaline Phosphatase: 44 U/L (ref 38–126)
Anion gap: 11 (ref 5–15)
BUN: 15 mg/dL (ref 8–23)
CO2: 24 mmol/L (ref 22–32)
Calcium: 9 mg/dL (ref 8.9–10.3)
Chloride: 104 mmol/L (ref 98–111)
Creatinine, Ser: 0.89 mg/dL (ref 0.61–1.24)
GFR calc Af Amer: >60 mL/min (ref >60)
GFR calc non Af Amer: >60 mL/min (ref >60)
Glucose, Bld: 271 mg/dL — ABNORMAL HIGH (ref 70–99)
Potassium: 4.9 mmol/L (ref 3.5–5.1)
Sodium: 139 mmol/L (ref 135–145)
Total Bilirubin: 0.6 mg/dL (ref 0.3–1.2)
Total Protein: 6.8 g/dL (ref 6.5–8.1)

## 2019-10-27 LAB — CBC WITH DIFFERENTIAL/PLATELET
Abs Immature Granulocytes: 0.03 10*3/uL (ref 0.00–0.07)
Basophils Absolute: 0 10*3/uL (ref 0.0–0.1)
Basophils Relative: 0 %
Eosinophils Absolute: 0 10*3/uL (ref 0.0–0.5)
Eosinophils Relative: 0 %
HCT: 45.9 % (ref 39.0–52.0)
Hemoglobin: 15 g/dL (ref 13.0–17.0)
Immature Granulocytes: 0 %
Lymphocytes Relative: 28 %
Lymphs Abs: 2.6 10*3/uL (ref 0.7–4.0)
MCH: 31.8 pg (ref 26.0–34.0)
MCHC: 32.7 g/dL (ref 30.0–36.0)
MCV: 97.5 fL (ref 80.0–100.0)
Monocytes Absolute: 0.4 10*3/uL (ref 0.1–1.0)
Monocytes Relative: 4 %
Neutro Abs: 6.3 10*3/uL (ref 1.7–7.7)
Neutrophils Relative %: 68 %
Platelets: 195 10*3/uL (ref 150–400)
RBC: 4.71 MIL/uL (ref 4.22–5.81)
RDW: 12.9 % (ref 11.5–15.5)
WBC: 9.4 10*3/uL (ref 4.0–10.5)
nRBC: 0 % (ref 0.0–0.2)

## 2019-10-27 LAB — LACTATE DEHYDROGENASE: LDH: 154 U/L (ref 98–192)

## 2019-10-27 NOTE — Patient Instructions (Addendum)
[place] at [facility] Discharge Instructions  You were seen today by Dr. Delton Coombes. He went over your recent lab results. He will see you back in 8 months for labs and follow up.   Thank you for choosing [place] at [facility] to provide your oncology and hematology care.  To afford each patient quality time with our provider, please arrive at least 15 minutes before your scheduled appointment time.   If you have a lab appointment with [facility] please come in thru the  Main Entrance and check in at the main information desk  You need to re-schedule your appointment should you arrive 10 or more minutes late.  We strive to give you quality time with our providers, and arriving late affects you and other patients whose appointments are after yours.  Also, if you no show three or more times for appointments you may be dismissed from the clinic at the providers discretion.     Again, thank you for choosing [facility].  Our hope is that these requests will decrease the amount of time that you wait before being seen by our physicians.       _____________________________________________________________  Should you have questions after your visit to [facility], please contact our office at [phone] between the hours of 8:00 a.m. and 4:30 p.m.  Voicemails left after 4:00 p.m. will not be returned until the following business day.  For prescription refill requests, have your pharmacy contact our office and allow 72 hours.    Cancer Center Support Programs:   > Cancer Support Group  2nd Tuesday of the month 1pm-2pm, Journey Room

## 2019-11-01 ENCOUNTER — Encounter: Payer: Self-pay | Admitting: Family Medicine

## 2019-11-06 NOTE — Assessment & Plan Note (Signed)
1.  Easy bruising: -Patient has long history of easy bruising limited to forearms. -This has gotten worse since prednisone was started.  Basic coagulation panel including PT, PTT, fibrinogen, von Willebrand's panel were normal.  2.  Monoclonal B-cell lymphocytosis: - Flow cytometry on 05/10/2018 shows CD5 positive, CD10 negative, CD20 positive B-cell lymphocytosis. -He does not have any B symptoms.  Physical exam did not reveal any adenopathy or splenomegaly. -CBC today is within normal limits.  We will see him back in 8 months for follow-up.  3.  Myasthenia gravis: -He was diagnosed with seropositive bulbar myasthenia gravis with difficulty with speech and swallowing. -He is currently on CellCept 750 mg twice daily and prednisone 7.5 mg daily.  4.  Diabetes: -He is on Jardiance 10 mg daily and metformin once daily.

## 2019-11-06 NOTE — Progress Notes (Signed)
[place], [place] [address]   CLINIC:  Medical Oncology/Hematology  PCP:  [name], MD [place] [address] [phone]   REASON FOR VISIT: Easy bruising of forearms and a monoclonal B-cell lymphocytosis.  CURRENT THERAPY: CellCept and prednisone.   INTERVAL HISTORY:  [name] 69 y.o. male for follow-up of monoclonal B-cell lymphocytosis and easy bruising of the forearms.  Denies any fevers, night sweats or weight loss.  Denies any recurrent infections.  Appetite is 100%.  Energy levels are 50%.  He had cataract surgery done last Tuesday.   REVIEW OF SYSTEMS:  Review of Systems  All other systems reviewed and are negative.    PAST MEDICAL/SURGICAL HISTORY:  Past Medical History:  Diagnosis Date  . Cancer ([place])    skin cancer   . Cataract 2020, 2021   Both eyes  . Diabetes mellitus without complication ([place])   . GERD 12/12/2009  . HYPERGLYCEMIA 12/12/2009  . Myasthenia gravis ([place])   . OBSTRUCTIVE SLEEP APNEA 08/14/2010  . Sleep apnea    uses CPAP   . Thyroid disease   . TRANSAMINASES, SERUM, ELEVATED 12/12/2009   Past Surgical History:  Procedure Laterality Date  . BASAL CELL CARCINOMA EXCISION  2007   face (Mohs)  . CATARACT EXTRACTION Right 08/2019  . COLONOSCOPY  04/15/2012  . HERNIA REPAIR  [id number]   umbilical  . POLYPECTOMY       SOCIAL HISTORY:  Social History   Socioeconomic History  . Marital status: Married    Spouse name: Not on file  . Number of children: 1  . Years of education: vet school  . Highest education level: Professional school degree (e.g., MD, DDS, DVM, JD)  Occupational History  . Occupation: retired Animal nutritionist  Tobacco Use  . Smoking status: Current Every Day Smoker    Years: 10.00    Types: Cigars  . Smokeless tobacco: Never Used  . Tobacco comment: Pt states that he smokes 6 cigars a day, he smoked cigarettes for 10 years.  Substance and Sexual  Activity  . Alcohol use: Not Currently    Comment: rare  . Drug use: No  . Sexual activity: Not on file  Other Topics Concern  . Not on file  Social History Narrative   Lives with wife in a 2 story home.  Has one child. Retired Animal nutritionist.     Right handed    Social Determinants of Health   Financial Resource Strain:   . Difficulty of Paying Living Expenses: Not on file  Food Insecurity:   . Worried About Charity fundraiser in the Last Year: Not on file  . Ran Out of Food in the Last Year: Not on file  Transportation Needs:   . Lack of Transportation (Medical): Not on file  . Lack of Transportation (Non-Medical): Not on file  Physical Activity:   . Days of Exercise per Week: Not on file  . Minutes of Exercise per Session: Not on file  Stress:   . Feeling of Stress : Not on file  Social Connections:   . Frequency of Communication with Friends and Family: Not on file  . Frequency of Social Gatherings with Friends and Family: Not on file  . Attends Religious Services: Not on file  . Active Member of Clubs or Organizations: Not on file  . Attends Archivist Meetings: Not on file  . Marital Status: Not on file  Intimate  Partner Violence:   . Fear of Current or Ex-Partner: Not on file  . Emotionally Abused: Not on file  . Physically Abused: Not on file  . Sexually Abused: Not on file    FAMILY HISTORY:  Family History  Problem Relation Age of Onset  . Diabetes Mother        type ll  . Heart disease Father        ?atrial fibrillation  . Diabetes Maternal Grandfather   . Diabetes Paternal Grandmother   . Diabetes Paternal Grandfather   . Healthy Sister   . Diabetes Brother   . Healthy Daughter   . Colon cancer Neg Hx   . Stomach cancer Neg Hx   . Colon polyps Neg Hx   . Esophageal cancer Neg Hx   . Rectal cancer Neg Hx     CURRENT MEDICATIONS:  Outpatient Encounter Medications as of 10/27/2019  Medication Sig  . ACCU-CHEK FASTCLIX LANCETS MISC Test  once daily.  Dx E11.9  . Calcium-Magnesium-Vitamin D (CALCIUM 500 PO) Take by mouth.  . DUREZOL 0.05 % EMUL Place 1 drop into the left eye 4 (four) times daily.  . empagliflozin (JARDIANCE) 10 MG TABS tablet Take 10 mg by mouth daily.  Marland Kitchen glucose blood (ACCU-CHEK GUIDE) test strip Test once daily.  Dx e11.9  . metFORMIN (GLUCOPHAGE-XR) 500 MG 24 hr tablet Take one twice daily.  . mycophenolate (CELLCEPT) 500 MG tablet Take 1.5 tablets (750 mg total) by mouth 2 (two) times daily.  Marland Kitchen omeprazole (PRILOSEC) 20 MG capsule Take 20 mg by mouth daily.  . prednisoLONE acetate (PRED FORTE) 1 % ophthalmic suspension Place 1 drop into the left eye 4 (four) times daily.  . predniSONE (DELTASONE) 5 MG tablet Take 1.5 tablets (7.5 mg total) by mouth daily with breakfast.  . pyridostigmine (MESTINON) 60 MG tablet Take 1 tablet by mouth twice daily  . vitamin C (ASCORBIC ACID) 500 MG tablet Take 500 mg by mouth daily.  . [DISCONTINUED] EUTHYROX 25 MCG tablet TAKE 1 TABLET BY MOUTH ONCE DAILY BEFORE BREAKFAST  . Melatonin 5 MG CAPS Take 5 mg by mouth at bedtime as needed (for sleep).   . mirabegron ER (MYRBETRIQ) 25 MG TB24 tablet Take 1 tablet (25 mg total) by mouth daily.  . tamsulosin (FLOMAX) 0.4 MG CAPS capsule Take 1 capsule by mouth once daily (Patient not taking: Reported on 10/25/2019)  . [DISCONTINUED] moxifloxacin (VIGAMOX) 0.5 % ophthalmic solution INSTILL 1 DROP 4 TIMES DAILY INTO LEFT EYE STARTING 2 DAYS PRIOR TO SURGERY AND INSTILL 2 DROPS INTO THE LEFT EYE THE MORNING OF SURGERY.   No facility-administered encounter medications on file as of 10/27/2019.    ALLERGIES:  No Known Allergies   PHYSICAL EXAM:  ECOG Performance status: 1  Vitals:   10/27/19 1427  BP: 126/66  Pulse: 83  Resp: 18  Temp: (!) 97.5 F (36.4 C)  SpO2: 94%   Filed Weights   10/27/19 1427  Weight: 265 lb (120.2 kg)    Physical Exam Constitutional:      Appearance: Normal appearance.  Cardiovascular:      Rate and Rhythm: Normal rate and regular rhythm.     Heart sounds: Normal heart sounds.  Pulmonary:     Effort: Pulmonary effort is normal.     Breath sounds: Normal breath sounds.  Abdominal:     General: There is no distension.     Palpations: Abdomen is soft. There is no mass.  Musculoskeletal:  General: Normal range of motion.  Lymphadenopathy:     Cervical: No cervical adenopathy.  Skin:    General: Skin is warm.     Findings: Bruising present.  Neurological:     Mental Status: He is alert and oriented to person, place, and time. Mental status is at baseline.  Psychiatric:        Mood and Affect: Mood normal.        Behavior: Behavior normal.      LABORATORY DATA:  I have reviewed the labs as listed.  CBC    Component Value Date/Time   WBC 9.4 10/27/2019 1357   RBC 4.71 10/27/2019 1357   HGB 15.0 10/27/2019 1357   HGB 15.6 07/07/2019 0000   HCT 45.9 10/27/2019 1357   HCT 45.9 07/07/2019 0000   PLT 195 10/27/2019 1357   MCV 97.5 10/27/2019 1357   MCV 94 07/07/2019 0000   MCH 31.8 10/27/2019 1357   MCHC 32.7 10/27/2019 1357   RDW 12.9 10/27/2019 1357   RDW 12.4 07/07/2019 0000   LYMPHSABS 2.6 10/27/2019 1357   LYMPHSABS 3.3 (H) 07/07/2019 0000   MONOABS 0.4 10/27/2019 1357   EOSABS 0.0 10/27/2019 1357   EOSABS 0.0 07/07/2019 0000   BASOSABS 0.0 10/27/2019 1357   BASOSABS 0.0 07/07/2019 0000   CMP Latest Ref Rng & Units 10/27/2019 07/08/2019 04/19/2019  Glucose 70 - 99 mg/dL 271(H) 184(H) 348(H)  BUN 8 - 23 mg/dL 15 28(H) 21  Creatinine 0.61 - 1.24 mg/dL 0.89 0.92 1.03  Sodium 135 - 145 mmol/L 139 138 138  Potassium 3.5 - 5.1 mmol/L 4.9 4.5 5.0  Chloride 98 - 111 mmol/L 104 104 103  CO2 22 - 32 mmol/L 24 25 24   Calcium 8.9 - 10.3 mg/dL 9.0 9.6 9.6  Total Protein 6.5 - 8.1 g/dL 6.8 7.3 7.1  Total Bilirubin 0.3 - 1.2 mg/dL 0.6 0.4 0.6  Alkaline Phos 38 - 126 U/L 44 47 48  AST 15 - 41 U/L 44(H) 30 31  ALT 0 - 44 U/L 64(H) 54(H) 45(H)        DIAGNOSTIC IMAGING:  I have independently reviewed the scans and discussed with the patient.   ASSESSMENT & PLAN:   Monoclonal B-cell lymphocytosis 1.  Easy bruising: -Patient has long history of easy bruising limited to forearms. -This has gotten worse since prednisone was started.  Basic coagulation panel including PT, PTT, fibrinogen, von Willebrand's panel were normal.  2.  Monoclonal B-cell lymphocytosis: - Flow cytometry on 05/10/2018 shows CD5 positive, CD10 negative, CD20 positive B-cell lymphocytosis. -He does not have any B symptoms.  Physical exam did not reveal any adenopathy or splenomegaly. -CBC today is within normal limits.  We will see him back in 8 months for follow-up.  3.  Myasthenia gravis: -He was diagnosed with seropositive bulbar myasthenia gravis with difficulty with speech and swallowing. -He is currently on CellCept 750 mg twice daily and prednisone 7.5 mg daily.  4.  Diabetes: -He is on Jardiance 10 mg daily and metformin once daily.        Orders placed this encounter:  Orders Placed This Encounter  Procedures  . CBC with Differential/Platelet  . Comprehensive metabolic panel  . Lactate dehydrogenase      Derek Jack, MD [place] [phone]

## 2019-11-11 ENCOUNTER — Other Ambulatory Visit: Payer: Self-pay | Admitting: Family Medicine

## 2019-12-12 ENCOUNTER — Encounter: Payer: Self-pay | Admitting: Neurology

## 2019-12-12 ENCOUNTER — Ambulatory Visit: Payer: Medicare Other | Admitting: Neurology

## 2019-12-12 ENCOUNTER — Other Ambulatory Visit: Payer: Self-pay

## 2019-12-12 VITALS — BP 122/68 | HR 69 | Ht 71.0 in | Wt 266.0 lb

## 2019-12-12 DIAGNOSIS — R238 Other skin changes: Secondary | ICD-10-CM

## 2019-12-12 DIAGNOSIS — G7000 Myasthenia gravis without (acute) exacerbation: Secondary | ICD-10-CM | POA: Diagnosis not present

## 2019-12-12 DIAGNOSIS — E538 Deficiency of other specified B group vitamins: Secondary | ICD-10-CM | POA: Diagnosis not present

## 2019-12-12 DIAGNOSIS — R233 Spontaneous ecchymoses: Secondary | ICD-10-CM

## 2019-12-12 MED ORDER — MYCOPHENOLATE MOFETIL 500 MG PO TABS: 750.0000 mg | ORAL_TABLET | Freq: Two times a day (BID) | ORAL | 3 refills | Status: DC

## 2019-12-12 NOTE — Patient Instructions (Addendum)
Continue all your medications as you are taking  Return to clinic in August 2021

## 2019-12-12 NOTE — Progress Notes (Signed)
Follow-up Visit   Date: 12/12/2118    Rodney Martinez MRN: [id number] DOB: 1951-03-16   Interim History: Rodney Martinez is a 69 y.o. right-handed Caucasian male with OSA, hypothyroidism, and diabetes mellitus returning to the clinic for follow-up of seropositive myasthenia gravis.  The patient was accompanied to the clinic by self.  History of present illness: Starting in mid January 2018, he developed slurred speech, difficulty swallowing, droopy eyelids, and weakness with moving his tongue. MRI brain was negative.  There was high clinical suspicion for myasthenia so he was started on mestinon 30mg  three times daily; in the interim, AChR antibodies returned positive.  He saw my colleague, Dr. Tomi Likens initially who started him on prednisone 20mg  and increased mestinon to 60mg  three times daily and is here to establish care with me. Due to lack of improvement, his prednisone was titrated to 60mg  daily and IVIG was started in March 2018. Within a month, his dysphagia and double vision improved.  In May, slow prednisone taper was started.  He was noted to have low platelets (127) and referred to hematology for evaluation, especially since he was also started on Cellcept.  By September he was down to prednisone 20mg  and getting IVIG ever 3 weeks. Over 2019-2020, his IVIG has been tapered to every 8 weeks and eventually stopped in May 2020.  His Cellcept was increased to 750mg  BID in March 2020.  He continues a slow prednisone taper.   UPDATE 12/12/2019:  He is here for follow-up.  He has done well reducing prednisone down to 7.5mg  daily with no breakthrough symptoms.  He is doing well and reports no weakness.  He complains of 4-week history of bruising on the inner aspect of his knees, and attributes this to kneeling on gravel. He has not shown this to his PCP. Platelets on labs from January 2021 was normal.  Current medication: Prednisone 7.5mg  daily Cellcept 750mg  twice daily Mestinon 60mg   twice daily (9am, 5pm)  Medications:  Current Outpatient Medications on File Prior to Visit  Medication Sig Dispense Refill  . ACCU-CHEK FASTCLIX LANCETS MISC Test once daily.  Dx E11.9 102 each 3  . Calcium-Magnesium-Vitamin D (CALCIUM 500 PO) Take by mouth.    . DUREZOL 0.05 % EMUL Place 1 drop into the left eye 4 (four) times daily.    . empagliflozin (JARDIANCE) 10 MG TABS tablet Take 10 mg by mouth daily. 90 tablet 3  . glucose blood (ACCU-CHEK GUIDE) test strip Test once daily.  Dx e11.9 100 each 3  . levothyroxine (SYNTHROID) 25 MCG tablet TAKE 1 TABLET BY MOUTH ONCE DAILY BEFORE BREAKFAST 90 tablet 0  . Melatonin 5 MG CAPS Take 5 mg by mouth at bedtime as needed (for sleep).     . metFORMIN (GLUCOPHAGE-XR) 500 MG 24 hr tablet Take one twice daily. 180 tablet 3  . mirabegron ER (MYRBETRIQ) 25 MG TB24 tablet Take 1 tablet (25 mg total) by mouth daily. 30 tablet 5  . omeprazole (PRILOSEC) 20 MG capsule Take 20 mg by mouth daily.    . prednisoLONE acetate (PRED FORTE) 1 % ophthalmic suspension Place 1 drop into the left eye 4 (four) times daily.    . predniSONE (DELTASONE) 5 MG tablet Take 1.5 tablets (7.5 mg total) by mouth daily with breakfast. 150 tablet 3  . pyridostigmine (MESTINON) 60 MG tablet Take 1 tablet by mouth twice daily 180 tablet 3  . tamsulosin (FLOMAX) 0.4 MG CAPS capsule Take 1 capsule by mouth  once daily 90 capsule 0  . vitamin C (ASCORBIC ACID) 500 MG tablet Take 500 mg by mouth daily.     No current facility-administered medications on file prior to visit.    Allergies: No Known Allergies  Review of Systems:  CONSTITUTIONAL: No fevers, chills, night sweats, or weight loss.  EYES: No visual changes or eye pain ENT: No hearing changes.  No history of nose bleeds.   RESPIRATORY: No cough, wheezing and shortness of breath.   CARDIOVASCULAR: Negative for chest pain, and palpitations.   GI: Negative for abdominal discomfort, blood in stools or black stools.  No  recent change in bowel habits.   GU:  No history of incontinence.   MUSCLOSKELETAL: No history of joint pain or swelling.  No myalgias.   SKIN: Negative for lesions, rash, and itching.   ENDOCRINE: Negative for cold or heat intolerance, polydipsia or goiter.   PSYCH:  No depression or anxiety symptoms.   NEURO: As Above.   Vital Signs:  BP 122/68   Pulse 69   Ht 5\' 11"  (1.803 m)   Wt 266 lb (120.7 kg)   SpO2 97%   BMI 37.10 kg/m    General Medical Exam:   General:  Well appearing, comfortable  Ext: Ecchymosis over the medial knee  Neurological Exam: MENTAL STATUS including orientation to time, place, person, recent and remote memory, attention span and concentration, language, and fund of knowledge is normal.  Speech is normal, no dysarthria.    CRANIAL NERVES: Normal conjugate, extra-ocular eye movements in all directions of gaze. No ptosis at rest or with sustained upgaze.  Face is symmetric.  Facial muscles are 5/5 throughout - Orbicularis oris and oculi is 5/5; buccinator 5/5.  Palate elevates symmetrically.  Tongue strength is 5/5.    MOTOR:  Motor strength is 5/5 throughout.   COORDINATION/GAIT:  He is able to stand up without pushing off.   Gait narrow based and stable.  Data: AChR binding 1.01*, blocking 61*, modulating 25*  MRI brain wwo contrast 10/27/2017:  1. No acute intracranial process or abnormal enhancement. 2. Mild parenchymal brain volume loss for age.  CT chest w contrast 02/03/2018: No acute chest abnormality. Specifically, no evidence for a mediastinal mass or abnormal thymic tissue. Probable scarring in the left lower lung. Indeterminate 4 mm nodular density in the right middle lobe. No follow-up needed if patient is low-risk. Non-contrast chest CT can be considered in 12 months if patient is high-risk.   Lab Results  Component Value Date   WBC 9.4 10/27/2019   HGB 15.0 10/27/2019   HCT 45.9 10/27/2019   MCV 97.5 10/27/2019   PLT 195 10/27/2019    Lab Results  Component Value Date   CREATININE 0.89 10/27/2019   BUN 15 10/27/2019   NA 139 10/27/2019   K 4.9 10/27/2019   CL 104 10/27/2019   CO2 24 10/27/2019    IMPRESSION/PLAN: 1.  Seropositive bulbar myasthenia gravis without exacerbation (Dx 11/2017).  Initially treated with longterm IVIG which was discontinued in May 2020.  Cellcept started in April 2019, dose increase to 750mg  BID in March 2020. He required prednisone 60mg  with initial diagnosis and I have been slowly tapering the dose over the past 2 years. Clinically, he is doing very well on prednisone 7.5mg .  Due to warmer temperatures in the summer, I will not make any dose change until the fall as to avoid exacerbation  - Continue prednisone 7.5mg  daily  - Continue Cellcept 750mg  BID  -  Continue mestinon 60mg  at 9am and 5pm  - If he develops breakthrough symptoms during the summer, increase prednisone to 20mg  daily  - Labs reviewed and stable  2.  Long-term corticosteroid use  -Continue calcium and vitamin D  -Continue Pepcid for GI prophylaxis  3.  Vitamin B12 deficiency, continue oral 1031mcg daily  4. Bruising on legs, follow-up with PCP,   Return to clinic in 5 months   Thank you for allowing me to participate in patient's care.  If I can answer any additional questions, I would be pleased to do so.    Sincerely,    Brailynn Breth K. Posey Pronto, DO

## 2019-12-13 ENCOUNTER — Other Ambulatory Visit: Payer: Self-pay

## 2019-12-13 MED ORDER — MYCOPHENOLATE MOFETIL 500 MG PO TABS: 750.0000 mg | ORAL_TABLET | Freq: Two times a day (BID) | ORAL | 3 refills | Status: DC

## 2020-01-03 ENCOUNTER — Encounter: Payer: Self-pay | Admitting: Family Medicine

## 2020-01-18 ENCOUNTER — Other Ambulatory Visit: Payer: Self-pay

## 2020-01-18 ENCOUNTER — Ambulatory Visit (INDEPENDENT_AMBULATORY_CARE_PROVIDER_SITE_OTHER): Payer: Medicare Other | Admitting: Family Medicine

## 2020-01-18 ENCOUNTER — Encounter: Payer: Self-pay | Admitting: Family Medicine

## 2020-01-18 VITALS — BP 136/74 | HR 70 | Temp 98.1°F | Wt 264.8 lb

## 2020-01-18 DIAGNOSIS — E1165 Type 2 diabetes mellitus with hyperglycemia: Secondary | ICD-10-CM | POA: Diagnosis not present

## 2020-01-18 DIAGNOSIS — M5412 Radiculopathy, cervical region: Secondary | ICD-10-CM | POA: Diagnosis not present

## 2020-01-18 MED ORDER — GABAPENTIN 100 MG PO CAPS: 100.0000 mg | ORAL_CAPSULE | Freq: Three times a day (TID) | ORAL | 3 refills | Status: DC

## 2020-01-18 NOTE — Patient Instructions (Addendum)
Cervical Radiculopathy  Cervical radiculopathy happens when a nerve in the neck (a cervical nerve) is pinched or bruised. This condition can happen because of an injury to the cervical spine (vertebrae) in the neck, or as part of the normal aging process. Pressure on the cervical nerves can cause pain or numbness that travels from the neck all the way down into the arm and fingers. Usually, this condition gets better with rest. Treatment may be needed if the condition does not improve. What are the causes? This condition may be caused by:  A neck injury.  A bulging (herniated) disk.  Muscle spasms.  Muscle tightness in the neck because of overuse.  Arthritis.  Breakdown or degeneration in the bones and joints of the spine (spondylosis) due to aging.  Bone spurs that may develop near the cervical nerves. What are the signs or symptoms? Symptoms of this condition include:  Pain. The pain may travel from the neck to the arm and hand. The pain can be severe or irritating. It may be worse when you move your neck.  Numbness or tingling in your arm or hand.  Weakness in the affected arm and hand, in severe cases. How is this diagnosed? This condition may be diagnosed based on your symptoms, your medical history, and a physical exam. You may also have tests, including:  X-rays.  A CT scan.  An MRI.  An electromyogram (EMG).  Nerve conduction tests. How is this treated? In many cases, treatment is not needed for this condition. With rest, the condition usually gets better over time. If treatment is needed, options may include:  Wearing a soft neck collar (cervical collar) for short periods of time, as told by your health care provider.  Doing physical therapy to strengthen your neck muscles.  Taking medicines, such as NSAIDs or oral corticosteroids.  Having spinal injections, in severe cases.  Having surgery. This may be needed if other treatments do not help. Different types  of surgery may be done depending on the cause of this condition. Follow these instructions at home: If you have a cervical collar:  Wear it as told by your health care provider. Remove it only as told by your health care provider.  Ask your health care provider if you can remove the collar for cleaning and bathing. If you are allowed to remove the collar for cleaning or bathing: ? Follow instructions from your health care provider about how to remove the collar safely. ? Clean the collar by wiping it with mild soap and water and drying it completely. ? Take out any removable pads in the collar every 1-2 days, and wash them by hand with soap and water. Let them air-dry completely before you put them back in the collar. ? Check your skin under the collar for irritation or sores. If you see any, tell your health care provider. Managing pain      Take over-the-counter and prescription medicines only as told by your health care provider.  If directed, put ice on the affected area. ? If you have a soft neck collar, remove it as told by your health care provider. ? Put ice in a plastic bag. ? Place a towel between your skin and the bag. ? Leave the ice on for 20 minutes, 2-3 times a day.  If applying ice does not help, you can try using heat. Use the heat source that your health care provider recommends, such as a moist heat pack or a heating pad. ?  Place a towel between your skin and the heat source. ? Leave the heat on for 20-30 minutes. ? Remove the heat if your skin turns bright red. This is especially important if you are unable to feel pain, heat, or cold. You may have a greater risk of getting burned.  Try a gentle neck and shoulder massage to help relieve symptoms. Activity  Rest as needed.  Return to your normal activities as told by your health care provider. Ask your health care provider what activities are safe for you.  Do stretching and strengthening exercises as told by  your health care provider or physical therapist.  Do not lift anything that is heavier than 10 lb (4.5 kg) until your health care provider tells you that it is safe. General instructions  Use a flat pillow when you sleep.  Do not drive while wearing a cervical collar. If you do not have a cervical collar, ask your health care provider if it is safe to drive while your neck heals.  Ask your health care provider if the medicine prescribed to you requires you to avoid driving or using heavy machinery.  Do not use any products that contain nicotine or tobacco, such as cigarettes, e-cigarettes, and chewing tobacco. These can delay healing. If you need help quitting, ask your health care provider.  Keep all follow-up visits as told by your health care provider. This is important. Contact a health care provider if:  Your condition does not improve with treatment. Get help right away if:  Your pain gets much worse and cannot be controlled with medicines.  You have weakness or numbness in your hand, arm, face, or leg.  You have a high fever.  You have a stiff, rigid neck.  You lose control of your bowels or your bladder (have incontinence).  You have trouble with walking, balance, or speaking. Summary  Cervical radiculopathy happens when a nerve in the neck is pinched or bruised.  A nerve can get pinched from a bulging disk, arthritis, muscle spasms, or an injury to the neck.  Symptoms include pain, tingling, or numbness radiating from the neck into the arm or hand. Weakness can also occur in severe cases.  Treatment may include rest, wearing a cervical collar, and physical therapy. Medicines may be prescribed to help with pain. In severe cases, injections or surgery may be needed. This information is not intended to replace advice given to you by your health care provider. Make sure you discuss any questions you have with your health care provider. Document Revised: 08/13/2018  Document Reviewed: 08/13/2018 Elsevier Patient Education  [place] the Gabapentin one two to three times daily and may titrate gradually every few days up to 300 mg three times daily as needed.

## 2020-01-18 NOTE — Progress Notes (Signed)
Subjective:     Patient ID: Rodney Martinez, male   DOB: Apr 09, 1951, 69 y.o.   MRN: [id number]  HPI Rodney Martinez is seen with initial chief complaint of "right shoulder pain" with onset last Wednesday.  Denies any injury.  On further questioning, his pain is predominantly involving right side of neck with some radiation toward the scapula and down toward the elbow region.  His pain is consistently worse when turning his neck to either side.  No numbness or weakness.  He has noticed some relief when holding his forearm up against his chest with his elbow flexed.  He described this as a achy pain and 5-6 out of 10 in severity.  He took some methocarbamol and ibuprofen with some relief.  No chest pain.  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  Is maintained on low-dose prednisone 7.5 mg daily  He has type 2 diabetes.  Fair control.  Last A1c 7.2%.  No polydipsia or polyuria.  His diabetes is treated with Metformin and Jardiance  Past Medical History:  Diagnosis Date  . Cataract 2020, 2021   Both eyes  . Diabetes mellitus without complication ([place])   . GERD 12/12/2009  . HYPERGLYCEMIA 12/12/2009  . Myasthenia gravis ([place])   . OBSTRUCTIVE SLEEP APNEA 08/14/2010  . 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 SCC (squamous cell carcinoma) 09/22/2018   left upper outer eyelid - CX3 + 5FU  . Sleep apnea    uses CPAP   . Thyroid disease   . TRANSAMINASES, SERUM, ELEVATED 12/12/2009   Past Surgical History:  Procedure Laterality Date  . BASAL CELL CARCINOMA EXCISION  2007   face (Mohs)  . CATARACT EXTRACTION Right 08/2019  . COLONOSCOPY  04/15/2012  . HERNIA REPAIR  [id number]   umbilical  . POLYPECTOMY      reports that he has been smoking cigars. He has smoked for the past 10.00 years. He has never used smokeless tobacco. He reports previous alcohol use. He reports that he does not use drugs. family history includes Diabetes  in his brother, maternal grandfather, mother, paternal grandfather, and paternal grandmother; Healthy in his daughter and sister; Heart disease in his father. No Known Allergies   Review of Systems  Constitutional: Negative for appetite change and unexpected weight change.  Eyes: Negative for visual disturbance.  Respiratory: Negative for cough and shortness of breath.   Cardiovascular: Negative for chest pain.  Gastrointestinal: Negative for abdominal pain.  Musculoskeletal: Positive for neck pain. Negative for arthralgias, back pain and neck stiffness.  Neurological: Negative for weakness, numbness and headaches.       Objective:   Physical Exam Vitals reviewed.  Constitutional:      Appearance: Normal appearance.  Neck:     Comments: He has some pain right side of neck with lateral bending and rotation to the right side Cardiovascular:     Rate and Rhythm: Normal rate and regular rhythm.  Pulmonary:     Effort: Pulmonary effort is normal.     Breath sounds: Normal breath sounds.  Musculoskeletal:     Cervical back: Neck supple. No rigidity.  Neurological:     Mental Status: He is alert.     Comments: Deep tendon reflexes are trace upper extremity bilaterally.  He has full strength upper extremities.        Assessment:     #1 right cervical radiculitis pain.  Nonfocal neuro exam.  This is a new symptom with onset last week with no reported injury Doubt rotator cuff pathology  #2 type 2 diabetes with history of fair control by most recent A1c.  He has on chronic low-dose prednisone per neurology and this is likely exacerbating his diabetes somewhat    Plan:     -Discussed trial of gabapentin 100 mg 3 times daily and may slowly titrate up to target of 300 mg 3 times daily if necessary.  We reviewed potential side effects of gabapentin  -Follow-up immediately for any weakness or numbness involving the right upper extremity  -May continue methocarbamol every 6-8 hours  as needed  -Touch base if pain not improving over the next couple weeks  [name] MD [facility] at [facility]

## 2020-01-22 ENCOUNTER — Other Ambulatory Visit: Payer: Self-pay | Admitting: Family Medicine

## 2020-01-23 ENCOUNTER — Encounter: Payer: Self-pay | Admitting: Family Medicine

## 2020-01-24 MED ORDER — PREDNISONE 10 MG PO TABS: 10.0000 mg | ORAL_TABLET | ORAL | 0 refills | Status: DC

## 2020-01-31 ENCOUNTER — Encounter: Payer: Self-pay | Admitting: Family Medicine

## 2020-01-31 NOTE — Telephone Encounter (Signed)
See other mychart message.

## 2020-02-07 ENCOUNTER — Telehealth: Payer: Self-pay | Admitting: Family Medicine

## 2020-02-07 NOTE — Chronic Care Management (AMB) (Signed)
  Chronic Care Management   Note  02/07/2020 Name: Rodney Martinez MRN: [id number] DOB: Oct 13, 1950  Rodney Martinez is a 69 y.o. year old male who is a primary care patient of Burchette, Alinda Sierras, MD. I reached out to [name] by phone today in response to a referral sent by Rodney Martinez's PCP, [name], MD.   [name] was given information about Chronic Care Management services today including:  1. CCM service includes personalized support from designated clinical staff supervised by his physician, including individualized plan of care and coordination with other care providers 2. 24/7 contact phone numbers for assistance for urgent and routine care needs. 3. Service will only be billed when office clinical staff spend 20 minutes or more in a month to coordinate care. 4. Only one practitioner may furnish and bill the service in a calendar month. 5. The patient may stop CCM services at any time (effective at the end of the month) by phone call to the office staff.   Patient agreed to services and verbal consent obtained.   Follow up plan:   [place]

## 2020-02-08 DIAGNOSIS — M5411 Radiculopathy, occipito-atlanto-axial region: Secondary | ICD-10-CM | POA: Diagnosis not present

## 2020-02-08 DIAGNOSIS — M9901 Segmental and somatic dysfunction of cervical region: Secondary | ICD-10-CM | POA: Diagnosis not present

## 2020-02-13 ENCOUNTER — Other Ambulatory Visit: Payer: Self-pay | Admitting: Family Medicine

## 2020-02-13 DIAGNOSIS — M5411 Radiculopathy, occipito-atlanto-axial region: Secondary | ICD-10-CM | POA: Diagnosis not present

## 2020-02-13 DIAGNOSIS — M9901 Segmental and somatic dysfunction of cervical region: Secondary | ICD-10-CM | POA: Diagnosis not present

## 2020-02-15 DIAGNOSIS — M5411 Radiculopathy, occipito-atlanto-axial region: Secondary | ICD-10-CM | POA: Diagnosis not present

## 2020-02-15 DIAGNOSIS — M9901 Segmental and somatic dysfunction of cervical region: Secondary | ICD-10-CM | POA: Diagnosis not present

## 2020-02-20 DIAGNOSIS — M5411 Radiculopathy, occipito-atlanto-axial region: Secondary | ICD-10-CM | POA: Diagnosis not present

## 2020-02-20 DIAGNOSIS — M9901 Segmental and somatic dysfunction of cervical region: Secondary | ICD-10-CM | POA: Diagnosis not present

## 2020-02-22 DIAGNOSIS — M9901 Segmental and somatic dysfunction of cervical region: Secondary | ICD-10-CM | POA: Diagnosis not present

## 2020-02-22 DIAGNOSIS — M5411 Radiculopathy, occipito-atlanto-axial region: Secondary | ICD-10-CM | POA: Diagnosis not present

## 2020-02-23 NOTE — Chronic Care Management (AMB) (Deleted)
Chronic Care Management Pharmacy  Name: Rodney Martinez  MRN: [id number] DOB: Mar 06, 1951   Chief Complaint/ HPI  Rodney Martinez,  69 y.o. , male presents for their {Initial/Follow-up:3041532} CCM visit with the clinical pharmacist {CHL HP [facility] visit [id number].  PCP : Rodney Post, MD  Their chronic conditions include: type 2 diabetes, GERD, GERD, BPH, myasthenia gravis, hypothyroidism  *** 5/4  Office Visits: 01/18/20 OV - right cervical radiculitis pain. Started on neurontin 100 mg TID and may slowly titrate up to target of 300 mg TID. May continue robaxin every 6-8 hrs PRN.   Consult Visit: 12/12/19 Rodney Martinez, Neurology) - myasthenia gravis. On long term IVIG before. Started on cellcept 750 mg BID and prednisone 7.5 mg daily. If pt develops symptoms during summer, increase prednisone 20 mg daily. F/u in 5 months  10/27/19 OV Rodney Martinez, Onco) - Monoclonal B-cell lymphocytosis. Hx of easy bruising limited to forearms. Physical negative of adenopathy or splenomegaly. F/u in 8 months. No changes with medications.  Medications: Outpatient Encounter Medications as of 02/29/2020  Medication Sig  . ACCU-CHEK FASTCLIX LANCETS MISC Test once daily.  Dx E11.9  . Calcium-Magnesium-Vitamin D (CALCIUM 500 PO) Take by mouth.  . DUREZOL 0.05 % EMUL Place 1 drop into the left eye 4 (four) times daily.  . empagliflozin (JARDIANCE) 10 MG TABS tablet Take 10 mg by mouth daily.  Rodney Martinez 25 MCG tablet TAKE 1 TABLET BY MOUTH ONCE DAILY BEFORE BREAKFAST  . gabapentin (NEURONTIN) 100 MG capsule Take 1 capsule (100 mg total) by mouth 3 (three) times daily.  Marland Kitchen glucose blood (ACCU-CHEK GUIDE) test strip Test once daily.  Dx e11.9  . Melatonin 5 MG CAPS Take 5 mg by mouth at bedtime as needed (for sleep).   . metFORMIN (GLUCOPHAGE-XR) 500 MG 24 hr tablet Take one twice daily.  . mirabegron ER (MYRBETRIQ) 25 MG TB24 tablet Take 1 tablet (25 mg total) by mouth daily.  .  mycophenolate (CELLCEPT) 500 MG tablet Take 1.5 tablets (750 mg total) by mouth 2 (two) times daily.  Marland Kitchen omeprazole (PRILOSEC) 20 MG capsule Take 20 mg by mouth daily.  . prednisoLONE acetate (PRED FORTE) 1 % ophthalmic suspension Place 1 drop into the left eye 4 (four) times daily.  . predniSONE (DELTASONE) 10 MG tablet Take 1 tablet (10 mg total) by mouth as directed. 4 take for 1 day then 4 day two then 29for day three then 3 day four then 3 day five then 2 day six  then 2 day seven  and then resume usual dose  . predniSONE (DELTASONE) 5 MG tablet Take 1.5 tablets (7.5 mg total) by mouth daily with breakfast.  . pyridostigmine (MESTINON) 60 MG tablet Take 1 tablet by mouth twice daily  . tamsulosin (FLOMAX) 0.4 MG CAPS capsule Take 1 capsule by mouth once daily  . vitamin C (ASCORBIC ACID) 500 MG tablet Take 500 mg by mouth daily.   No facility-administered encounter medications on file as of 02/29/2020.     Current Diagnosis/Assessment:  Goals Addressed   None     {CHL [facility] Diagnosis/[phone]}   Diabetes   Recent Relevant Labs: Lab Results  Component Value Date/Time   HGBA1C 7.2 (A) 10/25/2019 12:04 PM   HGBA1C 8.0 (H) 07/08/2019 11:05 AM   HGBA1C 7.8 (H) 04/06/2019 08:59 AM   GFR 81.16 07/08/2019 11:05 AM   GFR 91.01 09/21/2018 03:19 PM   MICROALBUR <0.7 07/15/2019 07:34 AM  Checking BG: {CHL HP Blood Glucose Monitoring Frequency:[phone]}  Recent FBG Readings: *** Recent pre-meal BG readings: *** Recent 2hr PP BG readings:  *** Recent HS BG readings: ***  Patient has failed these meds in past: None Patient is currently {CHL Controlled/Uncontrolled:[phone]} on the following medications:  Marland Kitchen Metformin XR 500 mg 1 tablet BID . Jardiance 10 mg 1 tablet daily  Last diabetic Eye exam:  Lab Results  Component Value Date/Time   HMDIABEYEEXA No Retinopathy 02/15/2014 12:00 AM    Last diabetic Foot exam: No results found for:  HMDIABFOOTEX   We discussed: {CHL HP [facility] discussion:[id number]}  Plan  Continue {CHL HP [facility] Pharmacy [phone]}   GERD   Patient has failed these meds in past: ranitidine  Patient is currently {CHL Controlled/Uncontrolled:[phone]} on the following medications:   Omeprazole 20 mg 1 capsule daily  We discussed:  ***  Plan  Continue {CHL HP [facility] Pharmacy [phone]}   BPH   Patient has failed these meds in past: None Patient is currently {CHL Controlled/Uncontrolled:[phone]} on the following medications:   Myrbetriq 25 mg 1 tablet daily  Tamsulosin 0.4 mg 1 capsule daily  We discussed:  ***  Plan  Continue {CHL HP [facility] Pharmacy [phone]}   Hypothyroidism   TSH  Date Value Ref Range Status  02/25/2018 0.87 0.40 - 4.50 mIU/L Final     Patient has failed these meds in past: levothyroxine Patient is currently {CHL Controlled/Uncontrolled:[phone]} on the following medications:   Euthyrox 25 mcg 1 tablet daily  We discussed:  {CHL HP [facility] Pharm[facility] discussion:[id number]}  Plan  Continue {CHL HP [facility] Pharmacy [phone]}  Pain management   Patient has failed these meds in past: ibuprofen Patient is currently {CHL Controlled/Uncontrolled:[phone]} on the following medications:   Gabapentin 100 mg 1 capsule TID  We discussed:  ***  Plan  Continue {CHL HP [facility] Pharmacy [phone]}   Myasthenia Gravis   Patient has failed these meds in past: None Patient is currently {CHL Controlled/Uncontrolled:[phone]} on the following medications:   Prednisone 7.5 mg 1 tablet daily  Mycophenolate 500 mg 1.5 tablet BID  Pyridostigmine 60 mg 1 tablet BID  We discussed:  ***  Plan  Continue {CHL [facility] [id number]   OTCs/Health Maintenance   Patient is currently {CHL Controlled/Uncontrolled:[phone]} on the following medications: . Accu-check Fastclix  Lancets test once daily . Accu-check Guide Test Strips test once daily . Calcium/Magnesium/Vit D *** sig . Durezol 0.05% 1 drop into the left eye 4 times a day . Melatonin 5 mg 1 capsule at bedtime PRN sleep . Prednisolone acetate 1% opth soln 1 drop into the left eyes 4 times a day . Vitamin C 500 mg 1 tablet daily  We discussed:  ***  Plan  Continue {CHL [facility] [id number]   Medication Management   Pharmacy/Benefits: [facility] / [employer] Adherence:  Pt endorses ***% compliance  We discussed: ***  Plan  {US Pharmacy [id number]   Follow up: *** month phone visit  Geraldine Contras, PharmD Clinical Pharmacist [facility] [place] [phone]  *** high risk DM

## 2020-02-27 ENCOUNTER — Other Ambulatory Visit: Payer: Self-pay

## 2020-02-27 DIAGNOSIS — M5411 Radiculopathy, occipito-atlanto-axial region: Secondary | ICD-10-CM | POA: Diagnosis not present

## 2020-02-27 DIAGNOSIS — M9901 Segmental and somatic dysfunction of cervical region: Secondary | ICD-10-CM | POA: Diagnosis not present

## 2020-02-27 DIAGNOSIS — E1165 Type 2 diabetes mellitus with hyperglycemia: Secondary | ICD-10-CM

## 2020-02-27 DIAGNOSIS — E039 Hypothyroidism, unspecified: Secondary | ICD-10-CM

## 2020-02-27 NOTE — Telephone Encounter (Signed)
Referral has been placed. 

## 2020-02-29 ENCOUNTER — Telehealth: Payer: Medicare Other

## 2020-02-29 ENCOUNTER — Telehealth: Payer: Self-pay | Admitting: Family Medicine

## 2020-02-29 DIAGNOSIS — M5411 Radiculopathy, occipito-atlanto-axial region: Secondary | ICD-10-CM | POA: Diagnosis not present

## 2020-02-29 DIAGNOSIS — M9901 Segmental and somatic dysfunction of cervical region: Secondary | ICD-10-CM | POA: Diagnosis not present

## 2020-02-29 NOTE — Progress Notes (Signed)
  Chronic Care Management   Outreach Note  02/29/2020 Name: Rodney Martinez MRN: [id number] DOB: Dec 27, 1950  Referred by: [name], MD Reason for referral : No chief complaint on file.   An unsuccessful telephone outreach was attempted today. The patient was referred to the pharmacist for assistance with care management and care coordination.   Follow Up Plan:   Prathima Ghanta [facility]

## 2020-03-02 ENCOUNTER — Telehealth: Payer: Self-pay | Admitting: Family Medicine

## 2020-03-02 NOTE — Progress Notes (Signed)
  Chronic Care Management   Outreach Note  03/02/2020 Name: Rodney Martinez MRN: [id number] DOB: 1951-05-02  Referred by: [name], MD Reason for referral : No chief complaint on file.   An unsuccessful telephone outreach was attempted today. The patient was referred to the pharmacist for assistance with care management and care coordination.   Follow Up Plan:   Prathima Ghanta [facility]uler

## 2020-03-22 ENCOUNTER — Encounter: Payer: Self-pay | Admitting: Family Medicine

## 2020-03-23 MED ORDER — MIRABEGRON ER 25 MG PO TB24: 25.0000 mg | ORAL_TABLET | Freq: Every day | ORAL | 5 refills | Status: DC

## 2020-04-12 NOTE — Chronic Care Management (AMB) (Signed)
Chronic Care Management Pharmacy  Name: Rodney Martinez  MRN: [id number] DOB: 1951-03-08  Initial Questions: 1. Have you seen any other providers since your last visit? NA 2. Any changes in your medicines or health? No   Chief Complaint/ HPI  Rodney Martinez,  69 y.o. , male presents for their Initial CCM visit with the clinical pharmacist via telephone due to COVID-19 Pandemic.  Patient reported concerns of tamsulosin increasing urination. He notes he send in a message regarding this, but did not hear back. Patient also notes he is working with neurology to taper off prednisone some time later this year. Plans to decrease prednisone dose to '5mg'$  and 7.'5mg'$ . Cellcept is meant to take the place of prednisone, since he has been on prednisone for many years. He does note dose has been decreased over the years.   PCP : [name], MD  Their chronic conditions include: DM, Myasthenia gravis, Hypothyroidism, GERD, urgency of urination/ BPH  Office Visits: 01/18/2020- [name], MD- Patient presented for office visit for right shoulder pain. Patient to trial gabapentin '100mg'$  TID and may slowly titrate to '300mg'$  TID if necessary. Patient to continue methocarbamol every 6 to 8 hours as needed.   10/25/2019- [name], MD- Patient presented for office visit for follow up. A1c improved to 7.2%. Discussed addition of statin, patient declined. Patient to trial Myrbetriq '25mg'$  once daily. Patient to avoid medications with increased anticholinergic side effects. Patient to follow up in 3 months.   Consult Visit: 12/12/2019- Neurology- Narda Amber, DO- Patient presented for office visit for follow up of myasthenia gravis. Patient to continue prednisone 7.'5mg'$  daily, Cellcept '750mg'$  BID, Mestinon '60mg'$  BID. Patient to continue calcium and vitamin D, Pepcid. Follow up in 5 months.   10/27/2019- Hematology- Derek Jack, MD- Patient presented for office visit for monoclonal B-cell  lymphocytosis and easy bruising follow up. Patient to obtain labs: CBC, CMP, lactate dehydrogenase. Patient to follow up in 8 months.   Medications: Outpatient Encounter Medications as of 04/16/2020  Medication Sig  . ACCU-CHEK FASTCLIX LANCETS MISC Test once daily.  Dx E11.9  . Calcium-Magnesium-Vitamin D (CALCIUM 500 PO) Take 1 tablet by mouth at bedtime.   Marland Kitchen Emollient (DERMEND BRUISE FORMULA EX) Apply topically 2 (two) times daily as needed.  . empagliflozin (JARDIANCE) 10 MG TABS tablet Take 10 mg by mouth daily.  Arna Medici 25 MCG tablet TAKE 1 TABLET BY MOUTH ONCE DAILY BEFORE BREAKFAST  . glucose blood (ACCU-CHEK GUIDE) test strip Test once daily.  Dx e11.9  . Melatonin 10 MG TABS Take 5 mg by mouth at bedtime.   . metFORMIN (GLUCOPHAGE-XR) 500 MG 24 hr tablet Take one twice daily.  . mirabegron ER (MYRBETRIQ) 25 MG TB24 tablet Take 1 tablet (25 mg total) by mouth daily.  . mycophenolate (CELLCEPT) 500 MG tablet Take 1.5 tablets (750 mg total) by mouth 2 (two) times daily.  Marland Kitchen omeprazole (PRILOSEC) 20 MG capsule Take 20 mg by mouth daily.  . predniSONE (DELTASONE) 5 MG tablet Take 1.5 tablets (7.5 mg total) by mouth daily with breakfast.  . pyridostigmine (MESTINON) 60 MG tablet Take 1 tablet by mouth twice daily  . tamsulosin (FLOMAX) 0.4 MG CAPS capsule Take 1 capsule by mouth once daily  . vitamin B-12 (CYANOCOBALAMIN) 500 MCG tablet Take 500 mcg by mouth daily.  . DUREZOL 0.05 % EMUL Place 1 drop into the left eye 4 (four) times daily. (Patient not taking: Reported on 04/16/2020)  . gabapentin (NEURONTIN) 100  MG capsule Take 1 capsule (100 mg total) by mouth 3 (three) times daily. (Patient not taking: Reported on 04/16/2020)  . prednisoLONE acetate (PRED FORTE) 1 % ophthalmic suspension Place 1 drop into the left eye 4 (four) times daily. (Patient not taking: Reported on 04/16/2020)  . predniSONE (DELTASONE) 10 MG tablet Take 1 tablet (10 mg total) by mouth as directed. 4 take for 1 day  then 4 day two then 57fr day three then 3 day four then 3 day five then 2 day six  then 2 day seven  and then resume usual dose (Patient not taking: Reported on 04/16/2020)  . vitamin C (ASCORBIC ACID) 500 MG tablet Take 500 mg by mouth daily. (Patient not taking: Reported on 04/16/2020)   No facility-administered encounter medications on file as of 04/16/2020.     Current Diagnosis/Assessment:  Goals Addressed            This Visit's Progress   . Pharmacy Care Plan       CARE PLAN ENTRY (see longitudinal plan of care for additional care plan information)  Current Barriers:  . Chronic Disease Management support, education, and care coordination needs related to Diabetes, GERD, Hypothyroidism, BPH, and Myasthenia gravis  Diabetes Lab Results  Component Value Date/Time   HGBA1C 7.2 (A) 10/25/2019 12:04 PM   HGBA1C 8.0 (H) 07/08/2019 11:05 AM   HGBA1C 7.8 (H) 04/06/2019 08:59 AM   . Pharmacist Clinical Goal(s): o Over the next 90 days, patient will work with PharmD and providers to achieve A1c goal <7% . Current regimen:   empagliflozin (Jardiance) '10mg'$ , 1 tablet once daily   Metformin ER '500mg'$ , 1 tablet twice daily  . Interventions: .Marland KitchenMaintain a low carbohydrate diet, eating 45 grams of carbohydrates per meal (3 servings of carbohydrates per meal), 15 grams of carbohydrates per snack (1 serving of carbohydrate per snack).  . Patient self care activities - Over the next 90 days, patient will: o Continue current medications and follow up with neurologist with plans of steroid taper (might see improvement in blood sugars).   Hypothyroidism  TSH  Date Value Ref Range Status  02/25/2018 0.87 0.40 - 4.50 mIU/L Final   . Pharmacist Clinical Goal(s) o Over the next 90 days, patient will work with PharmD and providers to maintain TSH: 0.4 to 4.50 mIU/L . Current regimen:  o Euthyrox 225m, 1 tablet once daily before breakfast  . Interventions: o We discussed:  administration of  levothyroxine in regards to food and other medications (take 30 minutes before first meal and separate 4 hours from calcium, iron supplements)  o Recommend to obtain blood work for thyroid (TSH).  . Patient self care activities o Patient will continue current medication at least 30 minutes before first meal.  GERD . Pharmacist Clinical Goal(s) o Over the next 90 days, patient will work with PharmD and providers to minimize acid reflux symptoms.  . Current regimen:  o Omeprazole '20mg'$ , 1 capsule once daily . Patient self care activities o Patient will continue current medication as directed.   BPH . Pharmacist Clinical Goal(s) o Over the next 30  days, patient will work with PharmD and providers to minimize symptoms related to enlarged prostate.  . Current regimen:  . mirabegron ER '25mg'$ , 1 tablet once daily . tamsulosin (Flomax) 0.'4mg'$ , 1 capsule once daily . Interventions: o We discussed:   o - administration of tamsulosin 30 minutes after same meal  o Possible dose increase for better efficacy  . Patient  self care activities - Over the next 30 days, patient will: o Take mirabegron and  tamsulosin as directed.  Myasthenia gravis  . Pharmacist Clinical Goal(s) o Over the next 90 days, patient will work with PharmD and providers to minimize symptoms associated with myasthenia gravis.  . Current regimen:  . Prednisone '5mg'$  1.5 tablet (7.'5mg'$ ) once daily with breakfast  . Mycophenolate '500mg'$ , 1.5 tablets ('750mg'$ ) twice daily  . Pyridostigmine (mestinon) '60mg'$ , 1 tablet twice daily  . Patient self care activities o Patient will continue current medications as directed and follow up with neurologist.   Medication management . Pharmacist Clinical Goal(s): o Over the next 90 days, patient will work with PharmD and providers to maintain optimal medication adherence . Current pharmacy: [facility]  . Interventions o Comprehensive medication review performed. o Continue current medication  management strategy . Patient self care activities - Over the next 90 days, patient will: o Take medications as prescribed o Report any questions or concerns to PharmD and/or provider(s)  Initial goal documentation       SDOH Interventions     Most Recent Value  SDOH Interventions  Financial Strain Interventions Intervention Not Indicated  Transportation Interventions Intervention Not Indicated       Diabetes   Recent Relevant Labs: Lab Results  Component Value Date/Time   HGBA1C 7.2 (A) 10/25/2019 12:04 PM   HGBA1C 8.0 (H) 07/08/2019 11:05 AM   HGBA1C 7.8 (H) 04/06/2019 08:59 AM   MICROALBUR <0.7 07/15/2019 07:34 AM    Checking BG: patient reports not checking BGs   Recent BG Readings: N/A  Patient has failed these meds in past: none   Patient is currently uncontrolled on the following medications:   empagliflozin (Jardiance) '10mg'$ , 1 tablet once daily   Metformin ER '500mg'$ , 1 tablet twice daily   Last diabetic Eye exam:  Lab Results  Component Value Date/Time   HMDIABEYEEXA No Retinopathy 02/15/2014 12:00 AM    Last diabetic Foot exam: No results found for: HMDIABFOOTEX   Plan Continue current medications (expecting better control on BGs with steroid taper).   Statin benefit in DM    Lipid Panel     Component Value Date/Time   CHOL 145 11/02/2017 1102   TRIG 140.0 11/02/2017 1102   HDL 35.10 (L) 11/02/2017 1102   LDLCALC 82 11/02/2017 1102    Hepatic Function Latest Ref Rng & Units 10/27/2019 07/08/2019 04/19/2019  Total Protein 6.5 - 8.1 g/dL 6.8 7.3 7.1  Albumin 3.5 - 5.0 g/dL 4.5 4.7 4.5  AST 15 - 41 U/L 44(H) 30 31  ALT 0 - 44 U/L 64(H) 54(H) 45(H)  Alk Phosphatase 38 - 126 U/L 44 47 48  Total Bilirubin 0.3 - 1.2 mg/dL 0.6 0.4 0.6  Bilirubin, Direct 0.0 - 0.3 mg/dL - - -     The 10-year ASCVD risk score Mikey Bussing DC Jr., et al., 2013) is: 42.6%   Values used to calculate the score:     Age: 29 years     Sex: Male     Is Non-Hispanic African  American: No     Diabetic: Yes     Tobacco smoker: Yes     Systolic Blood Pressure: 161 mmHg     Is BP treated: No     HDL Cholesterol: 35.1 mg/dL     Total Cholesterol: 145 mg/dL   Patient has failed these meds in past: none   Patient is currently controlled on the following medications:  . No medications  We discussed:  statin benefits in patients with diabetes and high ASCVD risk  Plan Recommend statin in setting of DM and high ASCVD risk.  Readdress at follow up.   Myasthenia gravis    Patient has failed these meds in past: none   Patient is currently controlled on the following medications:  . Prednisone '5mg'$  1.5 tablet (7.'5mg'$ ) once daily with breakfast  . Mycophenolate '500mg'$ , 1.5 tablets ('750mg'$ ) twice daily  . Pyridostigmine (mestinon) '60mg'$ , 1 tablet twice daily   Plan Managed by neurologist.  Continue current medications  Hypothyroidism   Patient reports not a big fan of breakfast. Will drink a V8. And reports not paying much attention if taking with/without food.   Lab Results  Component Value Date/Time   TSH 0.87 02/25/2018 12:00 AM   TSH 3.75 11/02/2017 11:02 AM   FREET4 0.71 05/12/2016 09:16 AM   Patient is currently controlled on the following medications:  . Euthyrox 70mg, 1 tablet once daily before breakfast   We discussed:  administration of levothyroxine in regards to food and other medications (take 30 minutes before first meal)   Plan Recommend TSH.  Continue current medications  GERD    Patient is currently controlled on the following medications:  . Omeprazole '20mg'$ , 1 capsule once daily  Plan Continue current medications  BPH   Patient reports stopped taking tamsulosin (promoting constant urination)   Since stopping, patient reports he has noticed trouble starting urinating now. And was not  sure if he should restart tamsulosin.   Patient is currently controlled on the following medications:  . mirabegron ER '25mg'$ , 1 tablet once  daily . tamsulosin (Flomax) 0.'4mg'$ , 1 capsule once daily   We discussed:   - administration of tamsulosin 30 minutes after breakfast  Plan Recommend continuing tamsulosin 0.'4mg'$  30 minutes after breakfast. Recommend checking blood sugars to rule out polyuria due to this reason. Follow up in 1 month to assess symptoms and consider increasing dose of tamsulosin.  Continue current medications   Vitamin D/ calcium supplementation   Vit D, 25-Hydroxy  Date Value Ref Range Status  02/25/2018 30 30 - 100 ng/mL Final    Comment:    Vitamin D Status         25-OH Vitamin D: . Deficiency:                    <20 ng/mL Insufficiency:             20 - 29 ng/mL Optimal:                 > or = 30 ng/mL . For 25-OH Vitamin D testing on patients on  D2-supplementation and patients for whom quantitation  of D2 and D3 fractions is required, the QuestAssureD(TM) 25-OH VIT D, (D2,D3), LC/MS/MS is recommended: order  code [id number](patients >280yr. . For more information on this test, go to: http://education.questdiagnostics.com/faq/FAQ163 (This link is being provided for  informational/educational purposes only.)      Patient is currently controlled on the following medications:  Calcium '500mg'$  (with magnesium- vitamin D), 1 tablet at bedtime  We discussed:  Recommend [phone] units of vitamin D daily. Recommend 1200 mg of calcium daily from dietary and supplemental sources.   Plan Patient to include more dietary products with calcium Continue current medications   Sleep    Patient is currently controlled on the following medications:  . Marland Kitchenelatonin '10mg'$ , 1 tablet at bedtimefor sleep   Plan Continue current medications  Vitamin B12  Vitamin B-12  Date Value Ref Range Status  07/08/2019 498 211 - 911 pg/mL Final   Patient is currently controlled on the following medications:  Marland Kitchen Vitamin B12 546mg, 1 tablet once daily   Plan Continue current medications   Medication Management    Patient organizes medications: uses pill box  Primary pharmacy: [facility]  Adherence: no gaps in refill history (per medication dispense history from 10/22/19 to 04/16/2020)    Follow up Follow up visit with PharmD in 1 month to reassess BPH.    AAnson Crofts PharmD Clinical Pharmacist [phone]

## 2020-04-16 ENCOUNTER — Ambulatory Visit: Payer: Medicare Other

## 2020-04-16 ENCOUNTER — Other Ambulatory Visit: Payer: Self-pay

## 2020-04-16 DIAGNOSIS — R351 Nocturia: Secondary | ICD-10-CM

## 2020-04-16 DIAGNOSIS — N401 Enlarged prostate with lower urinary tract symptoms: Secondary | ICD-10-CM

## 2020-04-16 DIAGNOSIS — E039 Hypothyroidism, unspecified: Secondary | ICD-10-CM

## 2020-04-16 DIAGNOSIS — K219 Gastro-esophageal reflux disease without esophagitis: Secondary | ICD-10-CM

## 2020-04-16 DIAGNOSIS — E1165 Type 2 diabetes mellitus with hyperglycemia: Secondary | ICD-10-CM

## 2020-04-16 DIAGNOSIS — G7001 Myasthenia gravis with (acute) exacerbation: Secondary | ICD-10-CM

## 2020-04-19 ENCOUNTER — Other Ambulatory Visit: Payer: Self-pay | Admitting: Family Medicine

## 2020-04-19 NOTE — Patient Instructions (Addendum)
Visit Information  Goals Addressed            This Visit's Progress   . Pharmacy Care Plan       CARE PLAN ENTRY (see longitudinal plan of care for additional care plan information)  Current Barriers:  . Chronic Disease Management support, education, and care coordination needs related to Diabetes, GERD, Hypothyroidism, BPH, and Myasthenia gravis  Diabetes Lab Results  Component Value Date/Time   HGBA1C 7.2 (A) 10/25/2019 12:04 PM   HGBA1C 8.0 (H) 07/08/2019 11:05 AM   HGBA1C 7.8 (H) 04/06/2019 08:59 AM   . Pharmacist Clinical Goal(s): o Over the next 90 days, patient will work with PharmD and providers to achieve A1c goal <7% . Current regimen:   empagliflozin (Jardiance) 10mg , 1 tablet once daily   Metformin ER 500mg , 1 tablet twice daily  . Interventions: Marland Kitchen Maintain a low carbohydrate diet, eating 45 grams of carbohydrates per meal (3 servings of carbohydrates per meal), 15 grams of carbohydrates per snack (1 serving of carbohydrate per snack).  . Patient self care activities - Over the next 90 days, patient will: o Continue current medications and follow up with neurologist with plans of steroid taper (might see improvement in blood sugars).   Hypothyroidism  TSH  Date Value Ref Range Status  02/25/2018 0.87 0.40 - 4.50 mIU/L Final   . Pharmacist Clinical Goal(s) o Over the next 90 days, patient will work with PharmD and providers to maintain TSH: 0.4 to 4.50 mIU/L . Current regimen:  o Euthyrox 29mcg, 1 tablet once daily before breakfast  . Interventions: o We discussed:  administration of levothyroxine in regards to food and other medications (take 30 minutes before first meal and separate 4 hours from calcium, iron supplements)  o Recommend to obtain blood work for thyroid (TSH).  . Patient self care activities o Patient will continue current medication at least 30 minutes before first meal.  GERD . Pharmacist Clinical Goal(s) o Over the next 90 days, patient  will work with PharmD and providers to minimize acid reflux symptoms.  . Current regimen:  o Omeprazole 20mg , 1 capsule once daily . Patient self care activities o Patient will continue current medication as directed.   BPH . Pharmacist Clinical Goal(s) o Over the next 30  days, patient will work with PharmD and providers to minimize symptoms related to enlarged prostate.  . Current regimen:  . mirabegron ER 25mg , 1 tablet once daily . tamsulosin (Flomax) 0.4mg , 1 capsule once daily . Interventions: o We discussed:   o - administration of tamsulosin 30 minutes after same meal  o Possible dose increase for better efficacy  . Patient self care activities - Over the next 30 days, patient will: o Take mirabegron and  tamsulosin as directed.  Myasthenia gravis  . Pharmacist Clinical Goal(s) o Over the next 90 days, patient will work with PharmD and providers to minimize symptoms associated with myasthenia gravis.  . Current regimen:  . Prednisone 5mg  1.5 tablet (7.5mg ) once daily with breakfast  . Mycophenolate 500mg , 1.5 tablets (750mg ) twice daily  . Pyridostigmine (mestinon) 60mg , 1 tablet twice daily  . Patient self care activities o Patient will continue current medications as directed and follow up with neurologist.   Medication management . Pharmacist Clinical Goal(s): o Over the next 90 days, patient will work with PharmD and providers to maintain optimal medication adherence . Current pharmacy: [facility]  . Interventions o Comprehensive medication review performed. o Continue current medication management strategy .  Patient self care activities - Over the next 90 days, patient will: o Take medications as prescribed o Report any questions or concerns to PharmD and/or provider(s)  Initial goal documentation        Mr. Vertz was given information about Chronic Care Management services today including:  1. CCM service includes personalized support from designated  clinical staff supervised by his physician, including individualized plan of care and coordination with other care providers 2. 24/7 contact phone numbers for assistance for urgent and routine care needs. 3. Standard insurance, coinsurance, copays and deductibles apply for chronic care management only during months in which we provide at least 20 minutes of these services. Most insurances cover these services at 100%, however patients may be responsible for any copay, coinsurance and/or deductible if applicable. This service may help you avoid the need for more expensive face-to-face services. 4. Only one practitioner may furnish and bill the service in a calendar month. 5. The patient may stop CCM services at any time (effective at the end of the month) by phone call to the office staff.  Patient agreed to services and verbal consent obtained.   The patient verbalized understanding of instructions provided today and agreed to receive a mailed copy of patient instruction and/or educational materials. Telephone follow up appointment with pharmacy team member scheduled for: 05/21/2020  Anson Crofts, PharmD Clinical Pharmacist [facility] [phone]   Benign Prostatic Hyperplasia  Benign prostatic hyperplasia (BPH) is an enlarged prostate gland that is caused by the normal aging process and not by cancer. The prostate is a walnut-sized gland that is involved in the production of semen. It is located in front of the rectum and below the bladder. The bladder stores urine and the urethra is the tube that carries the urine out of the body. The prostate may get bigger as a man gets older. An enlarged prostate can press on the urethra. This can make it harder to pass urine. The build-up of urine in the bladder can cause infection. Back pressure and infection may progress to bladder damage and kidney (renal) failure. What are the causes? This condition is part of a  normal aging process. However, not all men develop problems from this condition. If the prostate enlarges away from the urethra, urine flow will not be blocked. If it enlarges toward the urethra and compresses it, there will be problems passing urine. What increases the risk? This condition is more likely to develop in men over the age of 50 years. What are the signs or symptoms? Symptoms of this condition include:  Getting up often during the night to urinate.  Needing to urinate frequently during the day.  Difficulty starting urine flow.  Decrease in size and strength of your urine stream.  Leaking (dribbling) after urinating.  Inability to pass urine. This needs immediate treatment.  Inability to completely empty your bladder.  Pain when you pass urine. This is more common if there is also an infection.  Urinary tract infection (UTI). How is this diagnosed? This condition is diagnosed based on your medical history, a physical exam, and your symptoms. Tests will also be done, such as:  A post-void bladder scan. This measures any amount of urine that may remain in your bladder after you finish urinating.  A digital rectal exam. In a rectal exam, your health care provider checks your prostate by putting a lubricated, gloved finger into your rectum to feel the back of your prostate gland.  This exam detects the size of your gland and any abnormal lumps or growths.  An exam of your urine (urinalysis).  A prostate specific antigen (PSA) screening. This is a blood test used to screen for prostate cancer.  An ultrasound. This test uses sound waves to electronically produce a picture of your prostate gland. Your health care provider may refer you to a specialist in kidney and prostate diseases (urologist). How is this treated? Once symptoms begin, your health care provider will monitor your condition (active surveillance or watchful waiting). Treatment for this condition will depend on  the severity of your condition. Treatment may include:  Observation and yearly exams. This may be the only treatment needed if your condition and symptoms are mild.  Medicines to relieve your symptoms, including: ? Medicines to shrink the prostate. ? Medicines to relax the muscle of the prostate.  Surgery in severe cases. Surgery may include: ? Prostatectomy. In this procedure, the prostate tissue is removed completely through an open incision or with a laparoscope or robotics. ? Transurethral resection of the prostate (TURP). In this procedure, a tool is inserted through the opening at the tip of the penis (urethra). It is used to cut away tissue of the inner core of the prostate. The pieces are removed through the same opening of the penis. This removes the blockage. ? Transurethral incision (TUIP). In this procedure, small cuts are made in the prostate. This lessens the prostate's pressure on the urethra. ? Transurethral microwave thermotherapy (TUMT). This procedure uses microwaves to create heat. The heat destroys and removes a small amount of prostate tissue. ? Transurethral needle ablation (TUNA). This procedure uses radio frequencies to destroy and remove a small amount of prostate tissue. ? Interstitial laser coagulation ([place]). This procedure uses a laser to destroy and remove a small amount of prostate tissue. ? Transurethral electrovaporization (TUVP). This procedure uses electrodes to destroy and remove a small amount of prostate tissue. ? Prostatic urethral lift. This procedure inserts an implant to push the lobes of the prostate away from the urethra. Follow these instructions at home:  Take over-the-counter and prescription medicines only as told by your health care provider.  Monitor your symptoms for any changes. Contact your health care provider with any changes.  Avoid drinking large amounts of liquid before going to bed or out in public.  Avoid or reduce how much caffeine  or alcohol you drink.  Give yourself time when you urinate.  Keep all follow-up visits as told by your health care provider. This is important. Contact a health care provider if:  You have unexplained back pain.  Your symptoms do not get better with treatment.  You develop side effects from the medicine you are taking.  Your urine becomes very dark or has a bad smell.  Your lower abdomen becomes distended and you have trouble passing your urine. Get help right away if:  You have a fever or chills.  You suddenly cannot urinate.  You feel lightheaded, or very dizzy, or you faint.  There are large amounts of blood or clots in the urine.  Your urinary problems become hard to manage.  You develop moderate to severe low back or flank pain. The flank is the side of your body between the ribs and the hip. These symptoms may represent a serious problem that is an emergency. Do not wait to see if the symptoms will go away. Get medical help right away. Call your local emergency services (911 in the  [place] Do not drive yourself to the hospital. Summary  Benign prostatic hyperplasia (BPH) is an enlarged prostate that is caused by the normal aging process and not by cancer.  An enlarged prostate can press on the urethra. This can make it hard to pass urine.  This condition is part of a normal aging process and is more likely to develop in men over the age of 58 years.  Get help right away if you suddenly cannot urinate. This information is not intended to replace advice given to you by your health care provider. Make sure you discuss any questions you have with your health care provider. Document Revised: 08/17/2018 Document Reviewed: 10/27/2016 Elsevier Patient Education  2020 [employer].

## 2020-04-20 NOTE — Telephone Encounter (Signed)
I am not sure who had taken this off his list but he has been on this in the past.  He is overdue for follow-up TSH though.  May refill but needs to get follow-up TSH within the next month or 2

## 2020-04-20 NOTE — Telephone Encounter (Signed)
Please advise do not see active on list

## 2020-05-02 ENCOUNTER — Encounter: Payer: Self-pay | Admitting: Neurology

## 2020-05-02 ENCOUNTER — Other Ambulatory Visit: Payer: Self-pay

## 2020-05-02 MED ORDER — MYCOPHENOLATE MOFETIL 500 MG PO TABS: 750.0000 mg | ORAL_TABLET | Freq: Two times a day (BID) | ORAL | 0 refills | Status: DC

## 2020-05-02 NOTE — Progress Notes (Addendum)
Stann Ore III Key: [id number] - Rx #: [id number] Need help? Call us at [phone] Outcome Approvedtoday Effective from 05/02/2020 through 05/02/2021. Drug Mycophenolate Mofetil 500MG  tablets Form [facility] [place] Medicare Part D General Authorization Form Original Claim Info [id number] MD CALL [phone] B/D DETERMINATION REQUIRED; MD [phone]

## 2020-05-14 ENCOUNTER — Ambulatory Visit: Payer: Medicare Other | Admitting: Neurology

## 2020-05-14 ENCOUNTER — Other Ambulatory Visit: Payer: Medicare Other

## 2020-05-14 ENCOUNTER — Other Ambulatory Visit: Payer: Self-pay

## 2020-05-14 ENCOUNTER — Encounter: Payer: Self-pay | Admitting: Neurology

## 2020-05-14 VITALS — BP 126/74 | HR 70 | Resp 18 | Ht 70.0 in | Wt 257.0 lb

## 2020-05-14 DIAGNOSIS — E1165 Type 2 diabetes mellitus with hyperglycemia: Secondary | ICD-10-CM | POA: Diagnosis not present

## 2020-05-14 DIAGNOSIS — G7000 Myasthenia gravis without (acute) exacerbation: Secondary | ICD-10-CM | POA: Diagnosis not present

## 2020-05-14 DIAGNOSIS — Z5181 Encounter for therapeutic drug level monitoring: Secondary | ICD-10-CM | POA: Diagnosis not present

## 2020-05-14 LAB — COMPREHENSIVE METABOLIC PANEL
ALT: 40 U/L (ref 0–53)
AST: 22 U/L (ref 0–37)
Albumin: 4.4 g/dL (ref 3.5–5.2)
Alkaline Phosphatase: 46 U/L (ref 39–117)
BUN: 17 mg/dL (ref 6–23)
CO2: 22 mEq/L (ref 19–32)
Calcium: 9.3 mg/dL (ref 8.4–10.5)
Chloride: 108 mEq/L (ref 96–112)
Creatinine, Ser: 0.99 mg/dL (ref 0.40–1.50)
GFR: 74.39 mL/min (ref >60.00)
Glucose, Bld: 242 mg/dL — ABNORMAL HIGH (ref 70–99)
Potassium: 4.6 mEq/L (ref 3.5–5.1)
Sodium: 141 mEq/L (ref 135–145)
Total Bilirubin: 0.5 mg/dL (ref 0.2–1.2)
Total Protein: 6.8 g/dL (ref 6.0–8.3)

## 2020-05-14 LAB — CBC
HCT: 42.6 % (ref 39.0–52.0)
Hemoglobin: 14.3 g/dL (ref 13.0–17.0)
MCHC: 33.7 g/dL (ref 30.0–36.0)
MCV: 95.6 fl (ref 78.0–100.0)
Platelets: 153 10*3/uL (ref 150.0–400.0)
RBC: 4.45 Mil/uL (ref 4.22–5.81)
RDW: 13.2 % (ref 11.5–15.5)
WBC: 8.1 10*3/uL (ref 4.0–10.5)

## 2020-05-14 LAB — HEMOGLOBIN A1C: Hgb A1c MFr Bld: 7.5 % — ABNORMAL HIGH (ref 4.6–6.5)

## 2020-05-14 LAB — TSH: TSH: 3.54 u[IU]/mL (ref 0.35–4.50)

## 2020-05-14 MED ORDER — MYCOPHENOLATE MOFETIL 500 MG PO TABS: 750.0000 mg | ORAL_TABLET | Freq: Two times a day (BID) | ORAL | 3 refills | Status: DC

## 2020-05-14 NOTE — Patient Instructions (Signed)
Continue your medications as you are taking  Check labs  Return to clinic in November/December

## 2020-05-14 NOTE — Progress Notes (Signed)
Follow-up Visit   Date: 05/15/2119    Rodney Martinez MRN: [id number] DOB: [birth year]   Interim History: Rodney Martinez is a 69 y.o. right-handed Caucasian male with OSA, hypothyroidism, and diabetes mellitus returning to the clinic for follow-up of seropositive myasthenia gravis.  The patient was accompanied to the clinic by self.  History of present illness: Starting in mid January 2018, he developed slurred speech, difficulty swallowing, droopy eyelids, and weakness with moving his tongue. MRI brain was negative.  There was high clinical suspicion for myasthenia so he was started on mestinon 30mg  three times daily; in the interim, AChR antibodies returned positive.  He saw my colleague, Dr. Tomi Likens initially who started him on prednisone 20mg  and increased mestinon to 60mg  three times daily and is here to establish care with me. Due to lack of improvement, his prednisone was titrated to 60mg  daily and IVIG was started in March 2018. Within a month, his dysphagia and double vision improved.  In May, slow prednisone taper was started.  He was noted to have low platelets (127) and referred to hematology for evaluation, especially since he was also started on Cellcept.  By September he was down to prednisone 20mg  and getting IVIG ever 3 weeks. Over 2019-2020, his IVIG has been tapered to every 8 weeks and eventually stopped in May 2020.  His Cellcept was increased to 750mg  BID in March 2020.  He continues a slow prednisone taper.    UPDATE 05/14/2020:  He is here for follow-up visit.  His myasthenia gravis seems to be well-controlled.  He had transient episode of double vision, which lasted a few seconds.  No problems with speech, swallowing, or arm/leg weakness.  He has been doing great on his current regimen.  He has noticed that if he is late in taking mestinon, sometimes he may feel weaker.  Current medication: Prednisone 7.5mg  daily Cellcept 750mg  twice daily Mestinon 60mg  twice daily  (9am, 5pm)  Medications:  Current Outpatient Medications on File Prior to Visit  Medication Sig Dispense Refill  . Calcium-Magnesium-Vitamin D (CALCIUM 500 PO) Take 1 tablet by mouth at bedtime.     . empagliflozin (JARDIANCE) 10 MG TABS tablet Take 10 mg by mouth daily. 90 tablet 3  . glucose blood (ACCU-CHEK GUIDE) test strip Test once daily.  Dx e11.9 100 each 3  . levothyroxine (SYNTHROID) 25 MCG tablet Take 1 tablet (25 mcg total) by mouth daily before breakfast. NEEDS FOLLOW UP FOR FURTHER REFILLS 90 tablet 0  . Melatonin 10 MG TABS Take 5 mg by mouth at bedtime.     . metFORMIN (GLUCOPHAGE-XR) 500 MG 24 hr tablet Take one twice daily. 180 tablet 3  . mycophenolate (CELLCEPT) 500 MG tablet Take 1.5 tablets (750 mg total) by mouth 2 (two) times daily. 90 tablet 0  . omeprazole (PRILOSEC) 20 MG capsule Take 20 mg by mouth daily.    . predniSONE (DELTASONE) 5 MG tablet Take 1.5 tablets (7.5 mg total) by mouth daily with breakfast. 150 tablet 3  . pyridostigmine (MESTINON) 60 MG tablet Take 1 tablet by mouth twice daily 180 tablet 3  . tamsulosin (FLOMAX) 0.4 MG CAPS capsule Take 1 capsule by mouth once daily 90 capsule 0  . vitamin B-12 (CYANOCOBALAMIN) 500 MCG tablet Take 500 mcg by mouth daily.    Marland Kitchen ACCU-CHEK FASTCLIX LANCETS MISC Test once daily.  Dx E11.9 102 each 3  . DUREZOL 0.05 % EMUL Place 1 drop into the left eye 4 (  four) times daily. (Patient not taking: Reported on 04/16/2020)    . Emollient (DERMEND BRUISE FORMULA EX) Apply topically 2 (two) times daily as needed.    . gabapentin (NEURONTIN) 100 MG capsule Take 1 capsule (100 mg total) by mouth 3 (three) times daily. (Patient not taking: Reported on 04/16/2020) 90 capsule 3  . mirabegron ER (MYRBETRIQ) 25 MG TB24 tablet Take 1 tablet (25 mg total) by mouth daily. 30 tablet 5  . prednisoLONE acetate (PRED FORTE) 1 % ophthalmic suspension Place 1 drop into the left eye 4 (four) times daily. (Patient not taking: Reported on  04/16/2020)    . predniSONE (DELTASONE) 10 MG tablet Take 1 tablet (10 mg total) by mouth as directed. 4 take for 1 day then 4 day two then 39for day three then 3 day four then 3 day five then 2 day six  then 2 day seven  and then resume usual dose (Patient not taking: Reported on 04/16/2020) 22 tablet 0  . vitamin C (ASCORBIC ACID) 500 MG tablet Take 500 mg by mouth daily. (Patient not taking: Reported on 04/16/2020)     No current facility-administered medications on file prior to visit.    Allergies: No Known Allergies  Vital Signs:  BP 126/74   Pulse 70   Resp 18   Ht 5\' 10"  (1.778 m)   Wt 257 lb (116.6 kg)   SpO2 98%   BMI 36.88 kg/m    General Medical Exam:   General:  Well appearing, comfortable  Ext: Ecchymosis over the arms   Neurological Exam: MENTAL STATUS including orientation to time, place, person, recent and remote memory, attention span and concentration, language, and fund of knowledge is normal.  Speech is normal, no dysarthria.    CRANIAL NERVES: Normal conjugate, extra-ocular eye movements in all directions of gaze. No ptosis at rest or with sustained upgaze.  Face is symmetric.  Facial muscles are 5/5 throughout - Orbicularis oris and oculi is 5/5; buccinator 5/5.  Tongue strength is 5/5.    MOTOR:  Motor strength is 5/5 throughout.   COORDINATION/GAIT:  He is able to stand up without pushing off.   Gait narrow based and stable.  Data: AChR binding 1.01*, blocking 61*, modulating 25*  MRI brain wwo contrast 10/27/2017:  1. No acute intracranial process or abnormal enhancement. 2. Mild parenchymal brain volume loss for age.  CT chest w contrast 02/03/2018: No acute chest abnormality. Specifically, no evidence for a mediastinal mass or abnormal thymic tissue. Probable scarring in the left lower lung. Indeterminate 4 mm nodular density in the right middle lobe. No follow-up needed if patient is low-risk. Non-contrast chest CT can be considered in 12 months if  patient is high-risk.   Lab Results  Component Value Date   WBC 9.4 10/27/2019   HGB 15.0 10/27/2019   HCT 45.9 10/27/2019   MCV 97.5 10/27/2019   PLT 195 10/27/2019   Lab Results  Component Value Date   CREATININE 0.89 10/27/2019   BUN 15 10/27/2019   NA 139 10/27/2019   K 4.9 10/27/2019   CL 104 10/27/2019   CO2 24 10/27/2019    IMPRESSION/PLAN: 1. 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  Diagnosed Feb 2019 with severe manifestation, initially on IVIG and prednisone 60mg .  He required long tapering dose of IVIG (dc'd 02/2019).  Cellcept added in 01/2018 and increased to 750mg  BID in March 2020. Clinically stable.  Plan to taper prednisone at his next visit.  - Continue prednisone  7.5mg  daily  - Continue Cellcept 750mg  BID  - Continue mestinon 60mg  at 9am and 5pm  2.  Long-term corticosteroid use  -Continue calcium and vitamin D  -Continue Pepcid for GI prophylaxis  - Check CMP, CBC, HbA1c, and TSH  3.  Vitamin B12 deficiency, continue oral 1096mcg daily  Return to clinic in 3-4 months   Thank you for allowing me to participate in patient's care.  If I can answer any additional questions, I would be pleased to do so.    Sincerely,    Patsey Pitstick K. Posey Pronto, DO

## 2020-05-21 ENCOUNTER — Telehealth: Payer: Medicare Other

## 2020-06-12 ENCOUNTER — Telehealth: Payer: Self-pay | Admitting: Pharmacist

## 2020-06-12 NOTE — Progress Notes (Signed)
I left the patient a message  about his upcoming appointment on 06-13-20 @  1:30 pm with the clinical pharmacist. He was asked to please have all medication on had to review the pharmacist.

## 2020-06-13 ENCOUNTER — Telehealth: Payer: Medicare Other

## 2020-06-15 ENCOUNTER — Ambulatory Visit: Payer: Medicare Other | Admitting: Pharmacist

## 2020-06-15 DIAGNOSIS — E1165 Type 2 diabetes mellitus with hyperglycemia: Secondary | ICD-10-CM

## 2020-06-15 DIAGNOSIS — N401 Enlarged prostate with lower urinary tract symptoms: Secondary | ICD-10-CM

## 2020-06-15 NOTE — Chronic Care Management (AMB) (Signed)
Chronic Care Management Pharmacy  Name: Rodney Martinez  MRN: [id number] DOB: [birth year]  Initial Questions: 1. Have you seen any other providers since your last visit? Yes - neurology 2. Any changes in your medicines or health? No   Chief Complaint/ HPI  Rodney Martinez,  69 y.o. , male presents for their Follow-Up CCM visit with the clinical pharmacist via telephone due to COVID-19 Pandemic.  PCP : [name], MD  Their chronic conditions include: DM, Myasthenia gravis, Hypothyroidism, GERD, urgency of urination/ BPH  Office Visits: 01/18/2020- [name], MD- Patient presented for office visit for right shoulder pain. Patient to trial gabapentin 100mg  TID and may slowly titrate to 300mg  TID if necessary. Patient to continue methocarbamol every 6 to 8 hours as needed.   10/25/2019- [name], MD- Patient presented for office visit for follow up. A1c improved to 7.2%. Discussed addition of statin, patient declined. Patient to trial Myrbetriq 25mg  once daily. Patient to avoid medications with increased anticholinergic side effects. Patient to follow up in 3 months.   Consult Visit: 05/14/2020 - Neurology- Narda Amber, DO- Patient presented for office visit for follow up of myasthenia gravis. He reports a transient episode of double vision, which lasted a few seconds but no other problems. Plan to taper prednisone at next visit. Follow up in 3-4 months.  12/12/2019- Neurology- Narda Amber, DO- Patient presented for office visit for follow up of myasthenia gravis. Patient to continue prednisone 7.5mg  daily, Cellcept 750mg  BID, Mestinon 60mg  BID. Patient to continue calcium and vitamin D, Pepcid. Follow up in 5 months.   10/27/2019- Hematology- Derek Jack, MD- Patient presented for office visit for monoclonal B-cell lymphocytosis and easy bruising follow up. Patient to obtain labs: CBC, CMP, lactate dehydrogenase. Patient to follow up in 8 months.    Medications: Outpatient Encounter Medications as of 06/15/2020  Medication Sig  . ACCU-CHEK FASTCLIX LANCETS MISC Test once daily.  Dx E11.9  . Calcium-Magnesium-Vitamin D (CALCIUM 500 PO) Take 1 tablet by mouth at bedtime.   . DUREZOL 0.05 % EMUL Place 1 drop into the left eye 4 (four) times daily.   Marland Kitchen Emollient (DERMEND BRUISE FORMULA EX) Apply topically 2 (two) times daily as needed.  . empagliflozin (JARDIANCE) 10 MG TABS tablet Take 10 mg by mouth daily.  Marland Kitchen glucose blood (ACCU-CHEK GUIDE) test strip Test once daily.  Dx e11.9  . levothyroxine (SYNTHROID) 25 MCG tablet Take 1 tablet (25 mcg total) by mouth daily before breakfast. NEEDS FOLLOW UP FOR FURTHER REFILLS  . Melatonin 10 MG TABS Take 5 mg by mouth at bedtime.   . metFORMIN (GLUCOPHAGE-XR) 500 MG 24 hr tablet Take one twice daily.  . mirabegron ER (MYRBETRIQ) 25 MG TB24 tablet Take 1 tablet (25 mg total) by mouth daily.  . mycophenolate (CELLCEPT) 500 MG tablet Take 1.5 tablets (750 mg total) by mouth 2 (two) times daily.  Marland Kitchen omeprazole (PRILOSEC) 20 MG capsule Take 20 mg by mouth daily.  . prednisoLONE acetate (PRED FORTE) 1 % ophthalmic suspension Place 1 drop into the left eye 4 (four) times daily.   . predniSONE (DELTASONE) 10 MG tablet Take 1 tablet (10 mg total) by mouth as directed. 4 take for 1 day then 4 day two then 29for day three then 3 day four then 3 day five then 2 day six  then 2 day seven  and then resume usual dose  . predniSONE (DELTASONE) 5 MG tablet Take 1.5 tablets (7.5 mg total) by mouth  daily with breakfast.  . pyridostigmine (MESTINON) 60 MG tablet Take 1 tablet by mouth twice daily  . tamsulosin (FLOMAX) 0.4 MG CAPS capsule Take 1 capsule by mouth once daily  . vitamin B-12 (CYANOCOBALAMIN) 500 MCG tablet Take 500 mcg by mouth daily.  . vitamin C (ASCORBIC ACID) 500 MG tablet Take 500 mg by mouth daily.    No facility-administered encounter medications on file as of 06/15/2020.     Current  Diagnosis/Assessment:  Goals Addressed            This Visit's Progress   . Pharmacy Care Plan       CARE PLAN ENTRY (see longitudinal plan of care for additional care plan information)  Current Barriers:  . Chronic Disease Management support, education, and care coordination needs related to Diabetes, GERD, Hypothyroidism, BPH, and Myasthenia gravis  Diabetes Lab Results  Component Value Date/Time   HGBA1C 7.5 (H) 05/14/2020 11:38 AM   HGBA1C 7.2 (A) 10/25/2019 12:04 PM   HGBA1C 8.0 (H) 07/08/2019 11:05 AM   . Pharmacist Clinical Goal(s): o Over the next 60 days, patient will work with PharmD and providers to achieve A1c goal <7% . Current regimen:   empagliflozin (Jardiance) 10mg , 1 tablet once daily   Metformin ER 500mg , 1 tablet twice daily  . Interventions: Marland Kitchen Maintain a low carbohydrate diet, eating 45 grams of carbohydrates per meal (3 servings of carbohydrates per meal), 15 grams of carbohydrates per snack (1 serving of carbohydrate per snack).  . We discussed the importance of checking blood sugars at home. . Patient self care activities - Over the next 60 days, patient will: o Continue current medications. o Check blood sugars at least a few times weekly and keep a record of the readings.  Hypothyroidism  TSH  Date Value Ref Range Status  05/14/2020 3.54 0.35 - 4.50 uIU/mL Final   . Pharmacist Clinical Goal(s) o Over the next 90 days, patient will work with PharmD and providers to maintain TSH: 0.4 to 4.50 mIU/L . Current regimen:  o Euthyrox 32mcg, 1 tablet once daily before breakfast  . Interventions: o We discussed:  administration of levothyroxine in regards to food and other medications (take 30 minutes before first meal and separate 4 hours from calcium, iron supplements)  . Patient self care activities o Patient will continue current medication at least 30 minutes before first meal.  GERD . Pharmacist Clinical Goal(s) o Over the next 90 days,  patient will work with PharmD and providers to minimize acid reflux symptoms.  . Current regimen:  o Omeprazole 20mg , 1 capsule once daily . Patient self care activities o Patient will continue current medication as directed.   BPH . Pharmacist Clinical Goal(s) o Over the next 60 days, patient will work with PharmD and providers to minimize symptoms related to enlarged prostate.  . Current regimen:  . mirabegron ER 25mg , 1 tablet once daily . tamsulosin (Flomax) 0.4mg , 1 capsule once daily . Interventions: o We discussed:   Administration of tamsulosin 30 minutes after same meal  o Possible dose increase for better efficacy  . Patient self care activities - Over the next 30 days, patient will: o Continue current medications as direction  Myasthenia gravis  . Pharmacist Clinical Goal(s) o Over the next 90 days, patient will work with PharmD and providers to minimize symptoms associated with myasthenia gravis.  . Current regimen:  . Prednisone 5mg  1.5 tablet (7.5mg ) once daily with breakfast  . Mycophenolate 500mg , 1.5 tablets (750mg )  twice daily  . Pyridostigmine (mestinon) 60mg , 1 tablet twice daily  . Patient self care activities o Patient will continue current medications as directed and follow up with neurologist.   Medication management . Pharmacist Clinical Goal(s): o Over the next 90 days, patient will work with PharmD and providers to maintain optimal medication adherence . Current pharmacy: [facility]  . Interventions o Comprehensive medication review performed. o Continue current medication management strategy . Patient self care activities - Over the next 90 days, patient will: o Take medications as prescribed o Report any questions or concerns to PharmD and/or provider(s)  Please see past updates related to this goal by clicking on the "Past Updates" button in the selected goal          SDOH Interventions     Most Recent Value  SDOH Interventions  Financial  Strain Interventions Intervention Not Indicated  Transportation Interventions Intervention Not Indicated      Diabetes   Recent Relevant Labs: Lab Results  Component Value Date/Time   HGBA1C 7.5 (H) 05/14/2020 11:38 AM   HGBA1C 7.2 (A) 10/25/2019 12:04 PM   HGBA1C 8.0 (H) 07/08/2019 11:05 AM   MICROALBUR <0.7 07/15/2019 07:34 AM    Checking BG: patient reports not checking BGs   Recent BG Readings: N/A  Patient has failed these meds in past: none   Patient is currently uncontrolled on the following medications:   empagliflozin (Jardiance) 10mg , 1 tablet once daily   Metformin ER 500mg , 1 tablet twice daily   Last diabetic Eye exam:  Lab Results  Component Value Date/Time   HMDIABEYEEXA No Retinopathy 02/15/2014 12:00 AM    Last diabetic Foot exam: No results found for: HMDIABFOOTEX   We discussed: the importance of home monitoring of blood sugars and the effect of steroids on his blood sugars  Plan Continue current medications. Patient will start checking blood sugars at least a couple times a week to determine at home control. Recommend an increase in metformin or Jardiance for further A1C lowering will discuss with PCP.   Statin benefit in DM    Lipid Panel     Component Value Date/Time   CHOL 145 11/02/2017 1102   TRIG 140.0 11/02/2017 1102   HDL 35.10 (L) 11/02/2017 1102   LDLCALC 82 11/02/2017 1102    Hepatic Function Latest Ref Rng & Units 05/14/2020 10/27/2019 07/08/2019  Total Protein 6.0 - 8.3 g/dL 6.8 6.8 7.3  Albumin 3.5 - 5.2 g/dL 4.4 4.5 4.7  AST 0 - 37 U/L 22 44(H) 30  ALT 0 - 53 U/L 40 64(H) 54(H)  Alk Phosphatase 39 - 117 U/L 46 44 47  Total Bilirubin 0.2 - 1.2 mg/dL 0.5 0.6 0.4  Bilirubin, Direct 0.0 - 0.3 mg/dL - - -     The 10-year ASCVD risk score Mikey Bussing DC Jr., et al., 2013) is: 38.4%   Values used to calculate the score:     Age: 5 years     Sex: Male     Is Non-Hispanic African American: No     Diabetic: Yes     Tobacco smoker:  Yes     Systolic Blood Pressure: 409 mmHg     Is BP treated: No     HDL Cholesterol: 35.1 mg/dL     Total Cholesterol: 145 mg/dL   Patient has failed these meds in past: none   Patient is currently controlled on the following medications:  . No medications  We discussed:  statin benefits in patients with  diabetes and high ASCVD risk  Plan Recommend at least moderate intensity statin in setting of DM and high ASCVD risk.  Patient willing to start if recommended by PCP.  Myasthenia gravis    Patient has failed these meds in past: none   Patient is currently controlled on the following medications:  . Prednisone 5mg  1.5 tablet (7.5mg ) once daily with breakfast  . Mycophenolate 500mg , 1.5 tablets (750mg ) twice daily  . Pyridostigmine (mestinon) 60mg , 1 tablet twice daily   Plan Managed by neurologist.  Continue current medications  Hypothyroidism    Lab Results  Component Value Date/Time   TSH 3.54 05/14/2020 11:38 AM   TSH 0.87 02/25/2018 12:00 AM   FREET4 0.71 05/12/2016 09:16 AM   Patient is currently controlled on the following medications:  . Euthyrox 89mcg, 1 tablet once daily before breakfast   We discussed:  administration of levothyroxine in regards to food and other medications (take 30 minutes before first meal)   Plan Continue current medications.  GERD   Patient is currently controlled on the following medications:  . Omeprazole 20mg , 1 capsule once daily  Plan Continue current medications  BPH   Patient reports he will stop taking Myrbetriq once he runs out of current supply. He doesn't feel as though it's working and not worth the cost.  Patient is currently uncontrolled on the following medications:  . mirabegron ER 25mg , 1 tablet once daily . tamsulosin (Flomax) 0.4mg , 1 capsule once daily   We discussed:  Patient has alternated time of administration of tamsulosin with no relief.  Plan Will discuss with PCP other medications for bladder  control and possible increase in tamsulosin dose.  Continue current medications   Vitamin D/ calcium supplementation   Vit D, 25-Hydroxy  Date Value Ref Range Status  02/25/2018 30 30 - 100 ng/mL Final    Comment:    Vitamin D Status         25-OH Vitamin D: . Deficiency:                    <20 ng/mL Insufficiency:             20 - 29 ng/mL Optimal:                 > or = 30 ng/mL . For 25-OH Vitamin D testing on patients on  D2-supplementation and patients for whom quantitation  of D2 and D3 fractions is required, the QuestAssureD(TM) 25-OH VIT D, (D2,D3), LC/MS/MS is recommended: order  code [phone] (patients >48yrs). . For more information on this test, go to: http://education.questdiagnostics.com/faq/FAQ163 (This link is being provided for  informational/educational purposes only.)      Patient is currently controlled on the following medications:  Calcium 500mg  (with magnesium- vitamin D), 1 tablet at bedtime  We discussed:  Recommend [phone] units of vitamin D daily. Recommend 1200 mg of calcium daily from dietary and supplemental sources.   Plan Continue current medications   Sleep    Patient is currently controlled on the following medications:  Marland Kitchen Melatonin 10mg , 1 tablet at bedtime for sleep   Plan Continue current medications  Vitamin B12   Vitamin B-12  Date Value Ref Range Status  07/08/2019 498 211 - 911 pg/mL Final   Patient is currently controlled on the following medications:  Marland Kitchen Vitamin B12 520mcg, 1 tablet once daily   Plan Continue current medications   Medication Management  Patient organizes medications: uses pill box  Primary pharmacy:  [facility]  Adherence: no gaps in refill history (per medication dispense history from 12/22/19 to 06/18/2020) except tamsulosin last dispensed on 02/13/20  Follow up Follow up visit with PharmD in 2 months.  Jeni Salles, PharmD Clinical Pharmacist [place] at  [place]

## 2020-06-18 ENCOUNTER — Ambulatory Visit: Payer: Medicare Other | Admitting: Dermatology

## 2020-06-18 ENCOUNTER — Encounter: Payer: Self-pay | Admitting: Dermatology

## 2020-06-18 ENCOUNTER — Other Ambulatory Visit: Payer: Self-pay

## 2020-06-18 DIAGNOSIS — L570 Actinic keratosis: Secondary | ICD-10-CM | POA: Diagnosis not present

## 2020-06-18 DIAGNOSIS — Z1283 Encounter for screening for malignant neoplasm of skin: Secondary | ICD-10-CM

## 2020-06-18 DIAGNOSIS — L821 Other seborrheic keratosis: Secondary | ICD-10-CM

## 2020-06-18 NOTE — Patient Instructions (Addendum)
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  Legs and all skin waist up were examined and there are currently no atypical moles, melanoma, new or old nonmole skin cancer.  He does have some crusts on sun exposed areas on the left cheek (3) on the left forehead.  Clinically these are a mixture of solar keratoses and benign keratoses and were treated with liquid nitrogen 5-second freeze.  Rest can expect that these may swell and peel in the next 3 to 4 weeks.  There is no special care.  If the freezing fails or if any of these progressively grows or bleeds, then I hope I will see him back in less than a year.  Otherwise 1 year follow-up.

## 2020-06-18 NOTE — Progress Notes (Signed)
LN2 on forehead and left cheek, performed by Dr Denna Haggard.

## 2020-06-19 NOTE — Patient Instructions (Signed)
Visit Information  Goals Addressed            This Visit's Progress   . Pharmacy Care Plan       CARE PLAN ENTRY (see longitudinal plan of care for additional care plan information)  Current Barriers:  . Chronic Disease Management support, education, and care coordination needs related to Diabetes, GERD, Hypothyroidism, BPH, and Myasthenia gravis  Diabetes Lab Results  Component Value Date/Time   HGBA1C 7.5 (H) 05/14/2020 11:38 AM   HGBA1C 7.2 (A) 10/25/2019 12:04 PM   HGBA1C 8.0 (H) 07/08/2019 11:05 AM   . Pharmacist Clinical Goal(s): o Over the next 60 days, patient will work with PharmD and providers to achieve A1c goal <7% . Current regimen:   empagliflozin (Jardiance) 10mg , 1 tablet once daily   Metformin ER 500mg , 1 tablet twice daily  . Interventions: Marland Kitchen Maintain a low carbohydrate diet, eating 45 grams of carbohydrates per meal (3 servings of carbohydrates per meal), 15 grams of carbohydrates per snack (1 serving of carbohydrate per snack).  . We discussed the importance of checking blood sugars at home. . Patient self care activities - Over the next 60 days, patient will: o Continue current medications. o Check blood sugars at least a few times weekly and keep a record of the readings.  Hypothyroidism  TSH  Date Value Ref Range Status  05/14/2020 3.54 0.35 - 4.50 uIU/mL Final   . Pharmacist Clinical Goal(s) o Over the next 90 days, patient will work with PharmD and providers to maintain TSH: 0.4 to 4.50 mIU/L . Current regimen:  o Euthyrox 23mcg, 1 tablet once daily before breakfast  . Interventions: o We discussed:  administration of levothyroxine in regards to food and other medications (take 30 minutes before first meal and separate 4 hours from calcium, iron supplements)  . Patient self care activities o Patient will continue current medication at least 30 minutes before first meal.  GERD . Pharmacist Clinical Goal(s) o Over the next 90 days, patient  will work with PharmD and providers to minimize acid reflux symptoms.  . Current regimen:  o Omeprazole 20mg , 1 capsule once daily . Patient self care activities o Patient will continue current medication as directed.   BPH . Pharmacist Clinical Goal(s) o Over the next 60 days, patient will work with PharmD and providers to minimize symptoms related to enlarged prostate.  . Current regimen:  . mirabegron ER 25mg , 1 tablet once daily . tamsulosin (Flomax) 0.4mg , 1 capsule once daily . Interventions: o We discussed:   Administration of tamsulosin 30 minutes after same meal  o Possible dose increase for better efficacy  . Patient self care activities - Over the next 30 days, patient will: o Continue current medications as direction  Myasthenia gravis  . Pharmacist Clinical Goal(s) o Over the next 90 days, patient will work with PharmD and providers to minimize symptoms associated with myasthenia gravis.  . Current regimen:  . Prednisone 5mg  1.5 tablet (7.5mg ) once daily with breakfast  . Mycophenolate 500mg , 1.5 tablets (750mg ) twice daily  . Pyridostigmine (mestinon) 60mg , 1 tablet twice daily  . Patient self care activities o Patient will continue current medications as directed and follow up with neurologist.   Medication management . Pharmacist Clinical Goal(s): o Over the next 90 days, patient will work with PharmD and providers to maintain optimal medication adherence . Current pharmacy: [facility]  . Interventions o Comprehensive medication review performed. o Continue current medication management strategy . Patient self care  activities - Over the next 90 days, patient will: o Take medications as prescribed o Report any questions or concerns to PharmD and/or provider(s)  Please see past updates related to this goal by clicking on the "Past Updates" button in the selected goal         The patient verbalized understanding of instructions provided today and declined a  print copy of patient instruction materials.   Telephone follow up appointment with pharmacy team member scheduled for: 2 months  Jeni Salles, PharmD Clinical Pharmacist [place] at [place] [phone]

## 2020-06-29 ENCOUNTER — Telehealth: Payer: Self-pay | Admitting: Family Medicine

## 2020-06-29 MED ORDER — METFORMIN HCL ER 750 MG PO TB24: 750.0000 mg | ORAL_TABLET | Freq: Every day | ORAL | 3 refills | Status: DC

## 2020-06-29 NOTE — Telephone Encounter (Signed)
Discussed with clinical pharmacist possible increase in Metformin.  His last A1c was 7.5%.  Currently takes Metformin extended release 500 mg twice daily.  Will increase to Metformin extended release 750 mg twice daily and recommend A1c follow-up in about 3 months New prescription has been sent to his pharmacy

## 2020-07-03 ENCOUNTER — Other Ambulatory Visit (HOSPITAL_COMMUNITY): Payer: Medicare Other

## 2020-07-03 ENCOUNTER — Ambulatory Visit (HOSPITAL_COMMUNITY): Payer: Medicare Other | Admitting: Hematology

## 2020-07-14 ENCOUNTER — Encounter: Payer: Self-pay | Admitting: Dermatology

## 2020-07-14 NOTE — Progress Notes (Signed)
   Follow-Up Visit   Subjective  Rodney Martinez is a 69 y.o. male who presents for the following: Annual Exam (no new concerns).  Annual skin examination Location:  Duration:  Quality: Eucrisa left cheek Associated Signs/Symptoms: Modifying Factors:  Severity:  Timing: Context:   Objective  Well appearing patient in no apparent distress; mood and affect are within normal limits.  All skin waist up examined. Plus lower legs.   Assessment & Plan    AK (actinic keratosis) (2) Left Malar Cheek; Left Forehead  Destruction of lesion - Left Forehead, Left Malar Cheek  Destruction method: cryotherapy   Informed consent: discussed and consent obtained   Timeout:  patient name, date of birth, surgical site, and procedure verified Lesion destroyed using liquid nitrogen: Yes   Region frozen until ice ball extended beyond lesion: Yes   Cryotherapy cycles:  5 Outcome: patient tolerated procedure well with no complications    Seborrheic keratosis (2) Left Preauricular Area  LN2 freeze (no procedure charge)  Skin exam for malignant neoplasm Mid Back  Annual skin examination   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  Legs and all skin waist up were examined and there are currently no atypical moles, melanoma, new or old nonmole skin cancer.  He does have some crusts on sun exposed areas on the left cheek (3) + on the left forehead.  Clinically these are a mixture of solar keratoses and benign keratoses and were treated with liquid nitrogen 5-second freeze.  Delsa Bern can expect that these may swell and peel in the next 3 to 4 weeks.  There is no special care.  If the freezing fails or if   any of these progressively grows or bleeds, then I hope I will see him back in less than a year.  Otherwise 1 year follow-up.  I, Lavonna Monarch, MD, have reviewed all documentation for this visit.  The documentation on 07/14/20 for the exam, diagnosis,  procedures, and orders are all accurate and complete.

## 2020-07-31 ENCOUNTER — Other Ambulatory Visit: Payer: Self-pay | Admitting: Family Medicine

## 2020-08-09 ENCOUNTER — Telehealth: Payer: Self-pay | Admitting: Pharmacist

## 2020-08-09 NOTE — Chronic Care Management (AMB) (Signed)
I left the patient a message about his upcoming appointment on 08/10/2020 @ 2:00 PM with the clinical pharmacist. He was asked to please have all medication on had to review the pharmacist.

## 2020-08-10 ENCOUNTER — Ambulatory Visit: Payer: Medicare Other | Admitting: Pharmacist

## 2020-08-10 DIAGNOSIS — E1165 Type 2 diabetes mellitus with hyperglycemia: Secondary | ICD-10-CM

## 2020-08-10 DIAGNOSIS — N401 Enlarged prostate with lower urinary tract symptoms: Secondary | ICD-10-CM

## 2020-08-10 DIAGNOSIS — R351 Nocturia: Secondary | ICD-10-CM

## 2020-08-10 NOTE — Chronic Care Management (AMB) (Signed)
Chronic Care Management Pharmacy  Name: Rodney Martinez  MRN: [id number] DOB: 1950/11/30  Initial Questions: 1. Have you seen any other providers since your last visit? Yes - neurology 2. Any changes in your medicines or health? No   Chief Complaint/ HPI  Rodney Martinez,  69 y.o. , male presents for their Follow-Up CCM visit with the clinical pharmacist via telephone due to COVID-19 Pandemic.  PCP : Rodney Post, Martinez  Their chronic conditions include: DM, Myasthenia gravis, Hypothyroidism, GERD, urgency of urination/ BPH  Office Visits: 01/18/2020- Rodney Martinez- Patient presented for office visit for right shoulder pain. Patient to trial gabapentin 169m TID and may slowly titrate to 302mTID if necessary. Patient to continue methocarbamol every 6 to 8 hours as needed.   10/25/2019- Rodney Martinez- Patient presented for office visit for follow up. A1c improved to 7.2%. Discussed addition of statin, patient declined. Patient to trial Myrbetriq 2531mnce daily. Patient to avoid medications with increased anticholinergic side effects. Patient to follow up in 3 months.   Consult Visit: 06/18/20 Rodney Martinez (dermatology): Patient presented for annual follow up for actinic keratosis. Cryotherapy performed with no complications. Follow up in 1 year.    05/14/2020 - Neurology- Rodney AmberO- Patient presented for office visit for follow up of myasthenia gravis. He reports a transient episode of double vision, which lasted a few seconds but no other problems. Plan to taper prednisone at next visit. Follow up in 3-4 months.  12/12/2019- Neurology- Rodney AmberO- Patient presented for office visit for follow up of myasthenia gravis. Patient to continue prednisone 7.5mg13mily, Cellcept 750mg54m, Mestinon 60mg 85m Patient to continue calcium and vitamin D, Pepcid. Follow up in 5 months.   10/27/2019- Hematology- Rodney JackPatient presented for office visit  for monoclonal B-cell lymphocytosis and easy bruising follow up. Patient to obtain labs: CBC, CMP, lactate dehydrogenase. Patient to follow up in 8 months.   Medications: Outpatient Encounter Medications as of 08/10/2020  Medication Sig  . ACCU-CHEK FASTCLIX LANCETS MISC Test once daily.  Dx E11.9  . Calcium-Magnesium-Vitamin D (CALCIUM 500 PO) Take 1 tablet by mouth at bedtime.   . DUREZOL 0.05 % EMUL Place 1 drop into the left eye 4 (four) times daily.   . EmolMarland Kitchenient (DERMEND BRUISE FORMULA EX) Apply topically 2 (two) times daily as needed.  . empagliflozin (JARDIANCE) 10 MG TABS tablet Take 10 mg by mouth daily.  . glucMarland Kitchense blood (ACCU-CHEK GUIDE) test strip Test once daily.  Dx e11.9  . levothyroxine (SYNTHROID) 25 MCG tablet TAKE 1 TABLET BY MOUTH ONCE DAILY BEFORE BREAKFAST . APPOINTMENT REQUIRED FOR FUTURE REFILLS  . Melatonin 10 MG TABS Take 5 mg by mouth at bedtime.   . metFORMIN (GLUCOPHAGE-XR) 750 MG 24 hr tablet Take 1 tablet (750 mg total) by mouth daily with breakfast.  . mirabegron ER (MYRBETRIQ) 25 MG TB24 tablet Take 1 tablet (25 mg total) by mouth daily.  . mycophenolate (CELLCEPT) 500 MG tablet Take 1.5 tablets (750 mg total) by mouth 2 (two) times daily.  . omepMarland Kitchenazole (PRILOSEC) 20 MG capsule Take 20 mg by mouth daily.  . prednisoLONE acetate (PRED FORTE) 1 % ophthalmic suspension Place 1 drop into the left eye 4 (four) times daily.   . predniSONE (DELTASONE) 10 MG tablet Take 1 tablet (10 mg total) by mouth as directed. 4 take for 1 day then 4 day two then 4for [id number] then 3 day four then 3  day five then 2 day six  then 2 day seven  and then resume usual dose  . predniSONE (DELTASONE) 5 MG tablet Take 1.5 tablets (7.5 mg total) by mouth daily with breakfast.  . pyridostigmine (MESTINON) 60 MG tablet Take 1 tablet by mouth twice daily  . tamsulosin (FLOMAX) 0.4 MG CAPS capsule Take 1 capsule by mouth once daily  . vitamin B-12 (CYANOCOBALAMIN) 500 MCG tablet Take 500 mcg by  mouth daily.  . vitamin C (ASCORBIC ACID) 500 MG tablet Take 500 mg by mouth daily.    No facility-administered encounter medications on file as of 08/10/2020.     Current Diagnosis/Assessment:  Goals Addressed            This Visit's Progress   . Pharmacy Care Plan       CARE PLAN ENTRY (see longitudinal plan of care for additional care plan information)  Current Barriers:  . Chronic Disease Management support, education, and care coordination needs related to Diabetes, GERD, Hypothyroidism, BPH, and Myasthenia gravis  Diabetes Lab Results  Component Value Date/Time   HGBA1C 7.5 (H) 05/14/2020 11:38 AM   HGBA1C 7.2 (A) 10/25/2019 12:04 PM   HGBA1C 8.0 (H) 07/08/2019 11:05 AM   . Pharmacist Clinical Goal(s): o Over the next 90 days, patient will work with PharmD and providers to achieve A1c goal <7% . Current regimen:   empagliflozin (Jardiance) 50m, 1 tablet once daily   Metformin ER 7585m 1 tablet twice daily  . Interventions: . Marland Kitchenaintain a low carbohydrate diet, eating 45 grams of carbohydrates per meal (3 servings of carbohydrates per meal), 15 grams of carbohydrates per snack (1 serving of carbohydrate per snack).  . We discussed the importance of checking blood sugars at home. . Patient self care activities - Over the next 90 days, patient will: o Continue current medications. o Check blood sugars once weekly and keep a record of the readings.  Hypothyroidism  TSH  Date Value Ref Range Status  05/14/2020 3.54 0.35 - 4.50 uIU/mL Final   . Pharmacist Clinical Goal(s) o Over the next 90 days, patient will work with PharmD and providers to maintain TSH: 0.4 to 4.50 mIU/L . Current regimen:  o Euthyrox 2540m 1 tablet once daily before breakfast  . Interventions: o We discussed:  administration of levothyroxine in regards to food and other medications (take 30 minutes before first meal and separate 4 hours from calcium, iron supplements)  . Patient self care  activities o Patient will continue current medication at least 30 minutes before first meal.  GERD . Pharmacist Clinical Goal(s) o Over the next 90 days, patient will work with PharmD and providers to minimize acid reflux symptoms.  . Current regimen:  o Omeprazole 53m73m capsule once daily . Patient self care activities o Patient will continue current medication as directed.   BPH . Pharmacist Clinical Goal(s) o Over the next 90 days, patient will work with PharmD and providers to minimize symptoms related to enlarged prostate.  . Current regimen:  . tamsulosin (Flomax) 0.4mg,57mcapsule once daily . Interventions: o We discussed:   Administration of tamsulosin 30 minutes after same meal  o Possible dose increase for better efficacy  . Patient self care activities - Over the next 30 days, patient will: o Continue current medications as direction  Myasthenia gravis  . Pharmacist Clinical Goal(s) o Over the next 90 days, patient will work with PharmD and providers to minimize symptoms associated with myasthenia gravis.  .Marland Kitchen  Current regimen:  . Prednisone 55m 1.5 tablet (7.565m once daily with breakfast  . Mycophenolate 50070m1.5 tablets (750m40mwice daily  . Pyridostigmine (mestinon) 60mg70mtablet twice daily  . Patient self care activities o Patient will continue current medications as directed and follow up with neurologist.   Medication management . Pharmacist Clinical Goal(s): o Over the next 90 days, patient will work with PharmD and providers to maintain optimal medication adherence . Current pharmacy: [facility]  . Interventions o Comprehensive medication review performed. o Continue current medication management strategy . Patient self care activities - Over the next 90 days, patient will: o Take medications as prescribed o Report any questions or concerns to PharmD and/or provider(s)  Please see past updates related to this goal by clicking on the "Past Updates"  button in the selected goal            Diabetes  A1C goal < 7.5%  Recent Relevant Labs: Lab Results  Component Value Date/Time   HGBA1C 7.5 (H) 05/14/2020 11:38 AM   HGBA1C 7.2 (A) 10/25/2019 12:04 PM   HGBA1C 8.0 (H) 07/08/2019 11:05 AM   MICROALBUR <0.7 07/15/2019 07:34 AM    Checking BG: patient reports not checking BGs   Recent BG Readings: N/A  Patient has failed these meds in past: none   Patient is currently uncontrolled on the following medications:   empagliflozin (Jardiance) 10mg,66mablet once daily   Metformin ER 750mg, 23mblet twice daily   Last diabetic Eye exam:  Lab Results  Component Value Date/Time   HMDIABEYEEXA No Retinopathy 02/15/2014 12:00 AM    Last diabetic Foot exam: No results found for: HMDIABFOOTEX   We discussed: the importance of home monitoring of blood sugars  -Patient is taking metformin twice daily as instructed but prescription reads once daily - will discuss with PCP about sending updated Rx. -Patient has not started checking BGs  -Patient reports occasionally waking up in sweat - recommended checking BGs at minimum during these times to make sure he isn't having low BGs  Plan Continue current medications. Will send message to PCP to send in updated Rx for metformin  Statin benefit in DM    Lipid Panel     Component Value Date/Time   CHOL 145 11/02/2017 1102   TRIG 140.0 11/02/2017 1102   HDL 35.10 (L) 11/02/2017 1102   LDLCALC 82 11/02/2017 1102    Hepatic Function Latest Ref Rng & Units 05/14/2020 10/27/2019 07/08/2019  Total Protein 6.0 - 8.3 g/dL 6.8 6.8 7.3  Albumin 3.5 - 5.2 g/dL 4.4 4.5 4.7  AST 0 - 37 U/L 22 44(H) 30  ALT 0 - 53 U/L 40 64(H) 54(H)  Alk Phosphatase 39 - 117 U/L 46 44 47  Total Bilirubin 0.2 - 1.2 mg/dL 0.5 0.6 0.4  Bilirubin, Direct 0.0 - 0.3 mg/dL - - -     The 10-year ASCVD risk score (Goff DMikey Bussing, et al., 2013) is: 38.4%   Values used to calculate the score:     Age: 71 year58    Sex:  Male     Is Non-Hispanic African American: No     Diabetic: Yes     Tobacco smoker: Yes     Systolic Blood Pressure: 126 mmH450   Is BP treated: No     HDL Cholesterol: 35.1 mg/dL     Total Cholesterol: 145 mg/dL   Patient has failed these meds in past: none   Patient is  currently controlled on the following medications:  . No medications  We discussed:  statin benefits in patients with diabetes and high ASCVD risk  Plan Recommend at least moderate intensity statin in setting of DM and high ASCVD risk.  Patient willing to start if recommended by PCP.  Myasthenia gravis    Patient has failed these meds in past: none   Patient is currently controlled on the following medications:  . Prednisone 48m 1.5 tablet (7.5101m once daily with breakfast  . Mycophenolate 5004m1.5 tablets (750m51mwice daily  . Pyridostigmine (mestinon) 60mg55mtablet twice daily   Plan Managed by neurologist.  Continue current medications  Hypothyroidism    Lab Results  Component Value Date/Time   TSH 3.54 05/14/2020 11:38 AM   TSH 0.87 02/25/2018 12:00 AM   FREET4 0.71 05/12/2016 09:16 AM   Patient is currently controlled on the following medications:  . Euthyrox 25mcg44mtablet once daily before breakfast   We discussed:  administration of levothyroxine in regards to food and other medications (take 30 minutes before first meal)   Plan Continue current medications.  GERD   Patient is currently controlled on the following medications:  . Omeprazole 20mg, 52mpsule once daily  Plan Continue current medications  BPH  Patient stopped taking Myrbetriq once he ran out. He reports no difference when stopping.  Patient is currently uncontrolled on the following medications:  . tamsulosin (Flomax) 0.4mg, 1 65msule once daily   We discussed:  Patient has alternated time of administration of tamsulosin with no relief.  Plan Will discuss with PCP other medications for bladder  control. Continue current medications   Vitamin D/ calcium supplementation   Vit D, 25-Hydroxy  Date Value Ref Range Status  02/25/2018 30 30 - 100 ng/mL Final    Comment:    Vitamin D Status         25-OH Vitamin D: . Deficiency:                    <20 ng/mL Insufficiency:             20 - 29 ng/mL Optimal:                 > or = 30 ng/mL . For 25-OH Vitamin D testing on patients on  D2-supplementation and patients for whom quantitation  of D2 and D3 fractions is required, the QuestAssureD(TM) 25-OH VIT D, (D2,D3), LC/MS/MS is recommended: order  code 92888 ([id number] >76yrs). .67yr more information on this test, go to: http://education.questdiagnostics.com/faq/FAQ163 (This link is being provided for  informational/educational purposes only.)      Patient is currently controlled on the following medications:  Calcium 500mg (wit72mgnesium- vitamin D), 1 tablet at bedtime  We discussed:  Recommend [phone] units of vitamin D daily. Recommend 1200 mg of calcium daily from dietary and supplemental sources.   Plan Continue current medications   Sleep    Patient is currently controlled on the following medications:  . MelatoniMarland Kitchen 10mg, 1 ta43m at bedtime for sleep   Plan Continue current medications  Vitamin B12   Vitamin B-12  Date Value Ref Range Status  07/08/2019 498 211 - 911 pg/mL Final   Patient is currently controlled on the following medications:  . Vitamin BMarland Kitchen2 500mcg, 1 ta45m once daily   Plan Continue current medications  There were no vitals taken for this visit.  Vaccines   Reviewed and discussed patient's vaccination history.    Immunization  History  Administered Date(s) Administered  . Fluad Quad(high Dose 65+) 07/15/2019  . Influenza, High Dose Seasonal PF 11/02/2017  . Moderna SARS-COVID-2 Vaccination 11/11/2019, 12/14/2019  . Pneumococcal Conjugate-13 11/02/2017  . Pneumococcal Polysaccharide-23 12/12/2009, 07/15/2019  . Td 12/12/2009   . Zoster 02/16/2012    Plan  Recommended patient receive influenza, tetanus, and shingles vaccine in office/at pharmacy.   Medication Management   Pt uses [facility] for all medications  We discussed: Current pharmacy is preferred with insurance plan and patient is satisfied with pharmacy services  Plan  Continue current medication management strategy   Follow up: 3 month phone visit   Jeni Salles, PharmD Clinical Pharmacist [facility] [place] [phone]

## 2020-08-10 NOTE — Patient Instructions (Addendum)
Hi Rodney Martinez,  As always, it was great to get to talk to you today! I will make sure to have Dr. Elease Hashimoto send in an updated prescription for the metformin for you to pick up at the pharmacy. As far as checking your blood sugars, try to at least check if you are feeling sweaty, shaky, dizzy and hungry. I will attach some information to recognize and treat and symptoms of low blood sugars because we definitely want to avoid that if possible.   Please give me a call if you have any questions or if you need anything!  Best, Maddie  Jeni Salles, PharmD Clinical Pharmacist [facility]    Visit Information  Goals Addressed            This Visit's Progress   . Pharmacy Care Plan       CARE PLAN ENTRY (see longitudinal plan of care for additional care plan information)  Current Barriers:  . Chronic Disease Management support, education, and care coordination needs related to Diabetes, GERD, Hypothyroidism, BPH, and Myasthenia gravis  Diabetes Lab Results  Component Value Date/Time   HGBA1C 7.5 (H) 05/14/2020 11:38 AM   HGBA1C 7.2 (A) 10/25/2019 12:04 PM   HGBA1C 8.0 (H) 07/08/2019 11:05 AM   . Pharmacist Clinical Goal(s): o Over the next 90 days, patient will work with PharmD and providers to achieve A1c goal <7% . Current regimen:   empagliflozin (Jardiance) 10mg , 1 tablet once daily   Metformin ER 750mg , 1 tablet twice daily  . Interventions: Marland Kitchen Maintain a low carbohydrate diet, eating 45 grams of carbohydrates per meal (3 servings of carbohydrates per meal), 15 grams of carbohydrates per snack (1 serving of carbohydrate per snack).  . We discussed the importance of checking blood sugars at home. . Patient self care activities - Over the next 90 days, patient will: o Continue current medications. o Check blood sugars once weekly and keep a record of the readings.  Hypothyroidism  TSH  Date Value Ref Range Status  05/14/2020 3.54 0.35 -  4.50 uIU/mL Final   . Pharmacist Clinical Goal(s) o Over the next 90 days, patient will work with PharmD and providers to maintain TSH: 0.4 to 4.50 mIU/L . Current regimen:  o Euthyrox 2mcg, 1 tablet once daily before breakfast  . Interventions: o We discussed:  administration of levothyroxine in regards to food and other medications (take 30 minutes before first meal and separate 4 hours from calcium, iron supplements)  . Patient self care activities o Patient will continue current medication at least 30 minutes before first meal.  GERD . Pharmacist Clinical Goal(s) o Over the next 90 days, patient will work with PharmD and providers to minimize acid reflux symptoms.  . Current regimen:  o Omeprazole 20mg , 1 capsule once daily . Patient self care activities o Patient will continue current medication as directed.   BPH . Pharmacist Clinical Goal(s) o Over the next 90 days, patient will work with PharmD and providers to minimize symptoms related to enlarged prostate.  . Current regimen:  . tamsulosin (Flomax) 0.4mg , 1 capsule once daily . Interventions: o We discussed:   Administration of tamsulosin 30 minutes after same meal  o Possible dose increase for better efficacy  . Patient self care activities - Over the next 30 days, patient will: o Continue current medications as direction  Myasthenia gravis  . Pharmacist Clinical Goal(s) o Over the next 90 days, patient will work with PharmD and providers to  minimize symptoms associated with myasthenia gravis.  . Current regimen:  . Prednisone 5mg  1.5 tablet (7.5mg ) once daily with breakfast  . Mycophenolate 500mg , 1.5 tablets (750mg ) twice daily  . Pyridostigmine (mestinon) 60mg , 1 tablet twice daily  . Patient self care activities o Patient will continue current medications as directed and follow up with neurologist.   Medication management . Pharmacist Clinical Goal(s): o Over the next 90 days, patient will work with PharmD  and providers to maintain optimal medication adherence . Current pharmacy: [facility]  . Interventions o Comprehensive medication review performed. o Continue current medication management strategy . Patient self care activities - Over the next 90 days, patient will: o Take medications as prescribed o Report any questions or concerns to PharmD and/or provider(s)  Please see past updates related to this goal by clicking on the "Past Updates" button in the selected goal         The patient verbalized understanding of instructions provided today and declined a print copy of patient instruction materials.   Telephone follow up appointment with pharmacy team member scheduled for: 3 months   Hypoglycemia Hypoglycemia occurs when the level of sugar (glucose) in the blood is too low. Hypoglycemia can happen in people who do or do not have diabetes. It can develop quickly, and it can be a medical emergency. For most people with diabetes, a blood glucose level below 70 mg/dL (3.9 mmol/L) is considered hypoglycemia. Glucose is a type of sugar that provides the body's main source of energy. Certain hormones (insulin and glucagon) control the level of glucose in the blood. Insulin lowers blood glucose, and glucagon raises blood glucose. Hypoglycemia can result from having too much insulin in the bloodstream, or from not eating enough food that contains glucose. You may also have reactive hypoglycemia, which happens within 4 hours after eating a meal. What are the causes? Hypoglycemia occurs most often in people who have diabetes and may be caused by:  Diabetes medicine.  Not eating enough, or not eating often enough.  Increased physical activity.  Drinking alcohol on an empty stomach. If you do not have diabetes, hypoglycemia may be caused by:  A tumor in the pancreas.  Not eating enough, or not eating for long periods at a time (fasting).  A severe infection or illness.  Certain  medicines. What increases the risk? Hypoglycemia is more likely to develop in:  People who have diabetes and take medicines to lower blood glucose.  People who abuse alcohol.  People who have a severe illness. What are the signs or symptoms? Mild symptoms Mild hypoglycemia may not cause any symptoms. If you do have symptoms, they may include:  Hunger.  Anxiety.  Sweating and feeling clammy.  Dizziness or feeling light-headed.  Sleepiness.  Nausea.  Increased heart rate.  Headache.  Blurry vision.  Irritability.  Tingling or numbness around the mouth, lips, or tongue.  A change in coordination.  Restless sleep. Moderate symptoms Moderate hypoglycemia can cause:  Mental confusion and poor judgment.  Behavior changes.  Weakness.  Irregular heartbeat. Severe symptoms Severe hypoglycemia is a medical emergency. It can cause:  Fainting.  Seizures.  Loss of consciousness (coma).  Death. How is this diagnosed? Hypoglycemia is diagnosed with a blood test to measure your blood glucose level. This blood test is done while you are having symptoms. Your health care provider may also do a physical exam and review your medical history. How is this treated? This condition can often be treated by  immediately eating or drinking something that contains sugar, such as:  Fruit juice, 4-6 oz (120-150 mL).  Regular soda (not diet soda), 4-6 oz (120-150 mL).  Low-fat milk, 4 oz (120 mL).  Several pieces of hard candy.  Sugar or honey, 1 Tbsp (15 mL). Treating hypoglycemia if you have diabetes If you are alert and able to swallow safely, follow the 15:15 rule:  Take 15 grams of a rapid-acting carbohydrate. Talk with your health care provider about how much you should take.  Rapid-acting options include: ? Glucose pills (take 15 grams). ? 6-8 pieces of hard candy. ? 4-6 oz (120-150 mL) of fruit juice. ? 4-6 oz (120-150 mL) of regular (not diet) soda. ? 1 Tbsp  (15 mL) honey or sugar.  Check your blood glucose 15 minutes after you take the carbohydrate.  If the repeat blood glucose level is still at or below 70 mg/dL (3.9 mmol/L), take 15 grams of a carbohydrate again.  If your blood glucose level does not increase above 70 mg/dL (3.9 mmol/L) after 3 tries, seek emergency medical care.  After your blood glucose level returns to normal, eat a meal or a snack within 1 hour.  Treating severe hypoglycemia Severe hypoglycemia is when your blood glucose level is at or below 54 mg/dL (3 mmol/L). Severe hypoglycemia is a medical emergency. Get medical help right away. If you have severe hypoglycemia and you cannot eat or drink, you may need an injection of glucagon. A family member or close friend should learn how to check your blood glucose and how to give you a glucagon injection. Ask your health care provider if you need to have an emergency glucagon injection kit available. Severe hypoglycemia may need to be treated in a hospital. The treatment may include getting glucose through an IV. You may also need treatment for the cause of your hypoglycemia. Follow these instructions at home:  General instructions  Take over-the-counter and prescription medicines only as told by your health care provider.  Monitor your blood glucose as told by your health care provider.  Limit alcohol intake to no more than 1 drink a day for nonpregnant women and 2 drinks a day for men. One drink equals 12 oz of beer (355 mL), 5 oz of wine (148 mL), or 1 oz of hard liquor (44 mL).  Keep all follow-up visits as told by your health care provider. This is important. If you have diabetes:  Always have a rapid-acting carbohydrate snack with you to treat low blood glucose.  Follow your diabetes management plan as directed. Make sure you: ? Know the symptoms of hypoglycemia. It is important to treat it right away to prevent it from becoming severe. ? Take your medicines as  directed. ? Follow your exercise plan. ? Follow your meal plan. Eat on time, and do not skip meals. ? Check your blood glucose as often as directed. Always check before and after exercise. ? Follow your sick day plan whenever you cannot eat or drink normally. Make this plan in advance with your health care provider.  Share your diabetes management plan with people in your workplace, school, and household.  Check your urine for ketones when you are ill and as told by your health care provider.  Carry a medical alert card or wear medical alert jewelry. Contact a health care provider if:  You have problems keeping your blood glucose in your target range.  You have frequent episodes of hypoglycemia. Get help right away if:  You continue to have hypoglycemia symptoms after eating or drinking something containing glucose.  Your blood glucose is at or below 54 mg/dL (3 mmol/L).  You have a seizure.  You faint. These symptoms may represent a serious problem that is an emergency. Do not wait to see if the symptoms will go away. Get medical help right away. Call your local emergency services (911 in the [place] Summary  Hypoglycemia occurs when the level of sugar (glucose) in the blood is too low.  Hypoglycemia can happen in people who do or do not have diabetes. It can develop quickly, and it can be a medical emergency.  Make sure you know the symptoms of hypoglycemia and how to treat it.  Always have a rapid-acting carbohydrate snack with you to treat low blood sugar. This information is not intended to replace advice given to you by your health care provider. Make sure you discuss any questions you have with your health care provider. Document Revised: 03/15/2018 Document Reviewed: 10/26/2015 Elsevier Patient Education  2020 [employer].

## 2020-08-13 ENCOUNTER — Telehealth: Payer: Self-pay | Admitting: Family Medicine

## 2020-08-13 MED ORDER — ROSUVASTATIN CALCIUM 10 MG PO TABS
10.0000 mg | ORAL_TABLET | Freq: Every day | ORAL | 3 refills | Status: DC
Start: 2020-08-13 — End: 2021-07-15

## 2020-08-13 MED ORDER — METFORMIN HCL ER 750 MG PO TB24: 750.0000 mg | ORAL_TABLET | Freq: Two times a day (BID) | ORAL | 3 refills | Status: DC

## 2020-08-13 NOTE — Telephone Encounter (Signed)
Patient has been taking Metformin extended release 750 mg twice daily.  We will send a new prescription reflecting this dosage.  Also will start statin with Crestor 10 mg once daily and recommend office follow-up in about 2 months to reassess A1c, lipids, hepatic

## 2020-09-12 ENCOUNTER — Telehealth: Payer: Self-pay | Admitting: Pharmacist

## 2020-09-12 NOTE — Chronic Care Management (AMB) (Signed)
Chronic Care Management Pharmacy Assistant   Name: Rodney Martinez  MRN: [id number] DOB: Mar 25, 1951  Reason for Encounter: Disease State  Patient Questions:  1.  Have you seen any other providers since your last visit? No  2.  Any changes in your medicines or health? No   PCP : [name], MD  Allergies:  No Known Allergies  Medications: Outpatient Encounter Medications as of 09/12/2020  Medication Sig  . ACCU-CHEK FASTCLIX LANCETS MISC Test once daily.  Dx E11.9  . Calcium-Magnesium-Vitamin D (CALCIUM 500 PO) Take 1 tablet by mouth at bedtime.   . DUREZOL 0.05 % EMUL Place 1 drop into the left eye 4 (four) times daily.   Marland Kitchen Emollient (DERMEND BRUISE FORMULA EX) Apply topically 2 (two) times daily as needed.  . empagliflozin (JARDIANCE) 10 MG TABS tablet Take 10 mg by mouth daily.  Marland Kitchen glucose blood (ACCU-CHEK GUIDE) test strip Test once daily.  Dx e11.9  . levothyroxine (SYNTHROID) 25 MCG tablet TAKE 1 TABLET BY MOUTH ONCE DAILY BEFORE BREAKFAST . APPOINTMENT REQUIRED FOR FUTURE REFILLS  . Melatonin 10 MG TABS Take 5 mg by mouth at bedtime.   . metFORMIN (GLUCOPHAGE XR) 750 MG 24 hr tablet Take 1 tablet (750 mg total) by mouth in the morning and at bedtime.  . mirabegron ER (MYRBETRIQ) 25 MG TB24 tablet Take 1 tablet (25 mg total) by mouth daily.  . mycophenolate (CELLCEPT) 500 MG tablet Take 1.5 tablets (750 mg total) by mouth 2 (two) times daily.  Marland Kitchen omeprazole (PRILOSEC) 20 MG capsule Take 20 mg by mouth daily.  . prednisoLONE acetate (PRED FORTE) 1 % ophthalmic suspension Place 1 drop into the left eye 4 (four) times daily.   . predniSONE (DELTASONE) 10 MG tablet Take 1 tablet (10 mg total) by mouth as directed. 4 take for 1 day then 4 day two then 98for day three then 3 day four then 3 day five then 2 day six  then 2 day seven  and then resume usual dose  . predniSONE (DELTASONE) 5 MG tablet Take 1.5 tablets (7.5 mg total) by mouth daily with breakfast.  . pyridostigmine  (MESTINON) 60 MG tablet Take 1 tablet by mouth twice daily  . rosuvastatin (CRESTOR) 10 MG tablet Take 1 tablet (10 mg total) by mouth daily.  . tamsulosin (FLOMAX) 0.4 MG CAPS capsule Take 1 capsule by mouth once daily  . vitamin B-12 (CYANOCOBALAMIN) 500 MCG tablet Take 500 mcg by mouth daily.  . vitamin C (ASCORBIC ACID) 500 MG tablet Take 500 mg by mouth daily.    No facility-administered encounter medications on file as of 09/12/2020.    Current Diagnosis: Patient Active Problem List   Diagnosis Date Noted  . 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 Hypothyroid 04/05/2019  . Easy bruising 04/19/2018  . BPH (benign prostatic hyperplasia) 02/01/2018  . Bradycardia 12/31/2017  . Myasthenia gravis with acute exacerbation ([place]) 12/02/2017  . Tubular adenoma 11/02/2017  . Slurred speech 10/27/2017  . OBSTRUCTIVE SLEEP APNEA 08/14/2010  . GERD 12/12/2009  . Type 2 diabetes mellitus with hyperglycemia ([place]) 12/12/2009  . TRANSAMINASES, SERUM, ELEVATED 12/12/2009    Goals Addressed   None    Recent Relevant Labs: Lab Results  Component Value Date/Time   HGBA1C 7.5 (H) 05/14/2020 11:38 AM   HGBA1C 7.2 (A) 10/25/2019 12:04 PM   HGBA1C 8.0 (H) 07/08/2019 11:05 AM   MICROALBUR <0.7 07/15/2019 07:34 AM    Kidney Function Lab Results  Component  Value Date/Time   CREATININE 0.99 05/14/2020 11:42 AM   CREATININE 0.89 10/27/2019 01:57 PM   CREATININE 0.86 07/20/2018 03:29 PM   CREATININE 0.80 02/25/2018 12:00 AM   GFR 74.39 05/14/2020 11:42 AM   GFRNONAA >60 10/27/2019 01:57 PM   GFRNONAA 91 02/25/2018 12:00 AM   GFRAA >60 10/27/2019 01:57 PM   GFRAA 106 02/25/2018 12:00 AM    . Current antihyperglycemic regimen:  o Metformin 750 mg 24 hr tablet :Take one twice daily . What recent interventions/DTPs have been made to improve glycemic control:  o Increased Metformin 750 mg from once daily to twice a day . Have there been any recent hospitalizations or ED visits since  last visit with CPP? No . Patient denies hypoglycemic symptoms, including Pale, Sweaty, Shaky, Hungry, Nervous/irritable and Vision changes . Patient denies hyperglycemic symptoms, including blurry vision, excessive thirst, fatigue, polyuria and weakness . How often are you checking your blood sugar?  o Patient he is not currently taking is blood sugar . During the week, how often does your blood glucose drop below 70? unknown . Are you checking your feet daily/regularly? Yes   Adherence Review: Is the patient currently on a STATIN medication? Yes Is the patient currently on ACE/ARB medication? No Does the patient have >5 day gap between last estimated fill dates? No   Follow-Up:  Pharmacist Review  He was also scheduled a 67-month lab appointment to follow up on statin medications.  Maia Breslow, [facility] Assistant [phone]

## 2020-09-13 ENCOUNTER — Other Ambulatory Visit: Payer: Self-pay | Admitting: Family Medicine

## 2020-09-13 DIAGNOSIS — E785 Hyperlipidemia, unspecified: Secondary | ICD-10-CM | POA: Insufficient documentation

## 2020-09-13 DIAGNOSIS — E1165 Type 2 diabetes mellitus with hyperglycemia: Secondary | ICD-10-CM

## 2020-09-14 ENCOUNTER — Ambulatory Visit: Payer: Medicare Other | Admitting: Neurology

## 2020-09-25 ENCOUNTER — Encounter: Payer: Self-pay | Admitting: Family Medicine

## 2020-10-10 ENCOUNTER — Telehealth: Payer: Self-pay | Admitting: Family Medicine

## 2020-10-10 NOTE — Telephone Encounter (Signed)
Left message for patient to call back and schedule Medicare Annual Wellness Visit (AWV) either virtually or in office.   Last AWV no information please schedule at anytime with [facility] Nurse Health Advisor 1 or 2   This should be a 45 minute visit. 

## 2020-10-15 ENCOUNTER — Ambulatory Visit: Payer: Medicare Other | Admitting: Neurology

## 2020-10-15 ENCOUNTER — Other Ambulatory Visit: Payer: Self-pay

## 2020-10-15 ENCOUNTER — Encounter: Payer: Self-pay | Admitting: Neurology

## 2020-10-15 VITALS — BP 124/84 | HR 66 | Ht 71.0 in | Wt 261.0 lb

## 2020-10-15 DIAGNOSIS — G7000 Myasthenia gravis without (acute) exacerbation: Secondary | ICD-10-CM | POA: Diagnosis not present

## 2020-10-15 DIAGNOSIS — E538 Deficiency of other specified B group vitamins: Secondary | ICD-10-CM | POA: Diagnosis not present

## 2020-10-15 DIAGNOSIS — Z7952 Long term (current) use of systemic steroids: Secondary | ICD-10-CM

## 2020-10-15 DIAGNOSIS — Z5181 Encounter for therapeutic drug level monitoring: Secondary | ICD-10-CM

## 2020-10-15 NOTE — Patient Instructions (Addendum)
Reduce prednisone to 7.5mg  alternating with 5mg  x 1 month, then reduce to 5mg  daily.  If you develop any new weakness, double vision, difficulty swallowing/talking, go back to taking prendisone 7.5mg  daily and call my office  Continue mestinon 60mg  twice daily and Cellcept 750mg  twice daily  Return to clinic in 4 months

## 2020-10-15 NOTE — Progress Notes (Signed)
Follow-up Visit   Date: 10/15/2218    Rodney Martinez MRN: [id number] DOB: 11-24-1950   Interim History: Rodney Martinez is a 70 y.o. right-handed Caucasian male with OSA, hypothyroidism, and diabetes mellitus returning to the clinic for follow-up of seropositive myasthenia gravis.  The patient was accompanied to the clinic by self.  History of present illness: Starting in mid January 2018, he developed slurred speech, difficulty swallowing, droopy eyelids, and weakness with moving his tongue. MRI brain was negative.  There was high clinical suspicion for myasthenia so he was started on mestinon 30mg  three times daily; in the interim, AChR antibodies returned positive.  He saw my colleague, Dr. Tomi Likens initially who started him on prednisone 20mg  and increased mestinon to 60mg  three times daily and is here to establish care with me. Due to lack of improvement, his prednisone was titrated to 60mg  daily and IVIG was started in March 2018. Within a month, his dysphagia and double vision improved.  In May, slow prednisone taper was started.  He was noted to have low platelets (127) and referred to hematology for evaluation, especially since he was also started on Cellcept.  By September he was down to prednisone 20mg  and getting IVIG ever 3 weeks. Over 2019-2020, his IVIG has been tapered to every 8 weeks and eventually stopped in May 2020.  His Cellcept was increased to 750mg  BID in March 2020.  He continues a slow prednisone taper.    UPDATE 10/15/2020:  He is here for follow-up visit.  Myasthenia remains very well-controlled. He had one episode of mild change in sleep after skipping his mestinon one night, however, other times, he has been asymptomatic when taking it late or skipping it all together.  He denies any ongoing vision changes, double vision, droopiness of the eyes, difficulty with speech/swallow, or limb weakness.  Current medication: Prednisone 7.5mg  daily Cellcept 750mg  twice  daily Mestinon 60mg  twice daily (9am, 5pm)  Medications:  Current Outpatient Medications on File Prior to Visit  Medication Sig Dispense Refill  . ACCU-CHEK FASTCLIX LANCETS MISC Test once daily.  Dx E11.9 102 each 3  . Calcium-Magnesium-Vitamin D (CALCIUM 500 PO) Take 1 tablet by mouth at bedtime.     . DUREZOL 0.05 % EMUL Place 1 drop into the left eye 4 (four) times daily.     Marland Kitchen Emollient (DERMEND BRUISE FORMULA EX) Apply topically 2 (two) times daily as needed.    . empagliflozin (JARDIANCE) 10 MG TABS tablet Take 10 mg by mouth daily. 90 tablet 3  . glucose blood (ACCU-CHEK GUIDE) test strip Test once daily.  Dx e11.9 100 each 3  . levothyroxine (SYNTHROID) 25 MCG tablet TAKE 1 TABLET BY MOUTH ONCE DAILY BEFORE BREAKFAST . APPOINTMENT REQUIRED FOR FUTURE REFILLS 90 tablet 0  . Melatonin 10 MG TABS Take 5 mg by mouth at bedtime.     . metFORMIN (GLUCOPHAGE XR) 750 MG 24 hr tablet Take 1 tablet (750 mg total) by mouth in the morning and at bedtime. 180 tablet 3  . mirabegron ER (MYRBETRIQ) 25 MG TB24 tablet Take 1 tablet (25 mg total) by mouth daily. 30 tablet 5  . mycophenolate (CELLCEPT) 500 MG tablet Take 1.5 tablets (750 mg total) by mouth 2 (two) times daily. 270 tablet 3  . omeprazole (PRILOSEC) 20 MG capsule Take 20 mg by mouth daily.    . prednisoLONE acetate (PRED FORTE) 1 % ophthalmic suspension Place 1 drop into the left eye 4 (four) times daily.     Marland Kitchen  predniSONE (DELTASONE) 10 MG tablet Take 1 tablet (10 mg total) by mouth as directed. 4 take for 1 day then 4 day two then 75for day three then 3 day four then 3 day five then 2 day six  then 2 day seven  and then resume usual dose 22 tablet 0  . predniSONE (DELTASONE) 5 MG tablet Take 1.5 tablets (7.5 mg total) by mouth daily with breakfast. 150 tablet 3  . pyridostigmine (MESTINON) 60 MG tablet Take 1 tablet by mouth twice daily 180 tablet 3  . rosuvastatin (CRESTOR) 10 MG tablet Take 1 tablet (10 mg total) by mouth daily. 90  tablet 3  . tamsulosin (FLOMAX) 0.4 MG CAPS capsule Take 1 capsule by mouth once daily 90 capsule 0  . vitamin B-12 (CYANOCOBALAMIN) 500 MCG tablet Take 500 mcg by mouth daily.    . vitamin C (ASCORBIC ACID) 500 MG tablet Take 500 mg by mouth daily.      No current facility-administered medications on file prior to visit.    Allergies: No Known Allergies  Vital Signs:  There were no vitals taken for this visit.   General Medical Exam:   General:  Well appearing, comfortable  Ext: Ecchymosis over the arms   Neurological Exam: MENTAL STATUS including orientation to time, place, person, recent and remote memory, attention span and concentration, language, and fund of knowledge is normal.  Speech is normal, no dysarthria.    CRANIAL NERVES: Normal conjugate, extra-ocular eye movements in all directions of gaze. No ptosis at rest or with sustained upgaze.  Face is symmetric.  Facial muscles are 5/5 throughout - Orbicularis oris and oculi is 5/5; buccinator 5/5.  Tongue strength is 5/5.    MOTOR:  Motor strength is 5/5 throughout.   COORDINATION/GAIT:  He is able to stand up without pushing off.   Gait narrow based and stable.  Data: AChR binding 1.01*, blocking 61*, modulating 25*  MRI brain wwo contrast 10/27/2017:  1. No acute intracranial process or abnormal enhancement. 2. Mild parenchymal brain volume loss for age.  CT chest w contrast 02/03/2018: No acute chest abnormality. Specifically, no evidence for a mediastinal mass or abnormal thymic tissue. Probable scarring in the left lower lung. Indeterminate 4 mm nodular density in the right middle lobe. No follow-up needed if patient is low-risk. Non-contrast chest CT can be considered in 12 months if patient is high-risk.   Lab Results  Component Value Date   WBC 8.1 05/14/2020   HGB 14.3 05/14/2020   HCT 42.6 05/14/2020   MCV 95.6 05/14/2020   PLT 153.0 05/14/2020   Lab Results  Component Value Date   CREATININE 0.99  05/14/2020   BUN 17 05/14/2020   NA 141 05/14/2020   K 4.6 05/14/2020   CL 108 05/14/2020   CO2 22 05/14/2020   Lab Results  Component Value Date   HGBA1C 7.5 (H) 05/14/2020   Lab Results  Component Value Date   VITAMINB12 498 07/08/2019     IMPRESSION/PLAN: 1.  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  Diagnosed 11/2017 with severe initial presentation requiring IVIG (long taper, dc'd 02/2019) and prednisone 60mg .  Cellcept added in 01/2018 and increased to 750mg  BID in 12/2018.    - Reduce prednisone to 7.5mg  alternating with 5mg  daily x 1 month, then 5mg  daily  - Continue cellcept 750mg  BID  - Continue mestinon 60mg  BID (9am and 5pm)  2.  Long-term corticosteroid use  -Continue calcium and vitamin D  -Continue Pepcid  for GI prophylaxis  -Diabetes followed by PCP  3.  Vitamin B12 deficiency, continue oral 1062mcg daily  Return to clinic in 4 months   Thank you for allowing me to participate in patient's care.  If I can answer any additional questions, I would be pleased to do so.    Sincerely,    Joseff Luckman K. Posey Pronto, DO

## 2020-10-16 ENCOUNTER — Other Ambulatory Visit (INDEPENDENT_AMBULATORY_CARE_PROVIDER_SITE_OTHER): Payer: Medicare Other

## 2020-10-16 DIAGNOSIS — E785 Hyperlipidemia, unspecified: Secondary | ICD-10-CM

## 2020-10-16 DIAGNOSIS — E1165 Type 2 diabetes mellitus with hyperglycemia: Secondary | ICD-10-CM | POA: Diagnosis not present

## 2020-10-16 LAB — HEMOGLOBIN A1C: Hgb A1c MFr Bld: 7.3 % — ABNORMAL HIGH (ref 4.6–6.5)

## 2020-10-16 LAB — HEPATIC FUNCTION PANEL
ALT: 37 U/L (ref 0–53)
AST: 19 U/L (ref 0–37)
Albumin: 4.7 g/dL (ref 3.5–5.2)
Alkaline Phosphatase: 41 U/L (ref 39–117)
Bilirubin, Direct: 0.1 mg/dL (ref 0.0–0.3)
Total Bilirubin: 0.5 mg/dL (ref 0.2–1.2)
Total Protein: 6.5 g/dL (ref 6.0–8.3)

## 2020-10-16 LAB — LIPID PANEL
Cholesterol: 120 mg/dL (ref 0–200)
HDL: 45.5 mg/dL (ref >39.00)
NonHDL: 74.4
Total CHOL/HDL Ratio: 3
Triglycerides: 283 mg/dL — ABNORMAL HIGH (ref 0.0–149.0)
VLDL: 56.6 mg/dL — ABNORMAL HIGH (ref 0.0–40.0)

## 2020-10-16 LAB — LDL CHOLESTEROL, DIRECT: Direct LDL: 51 mg/dL

## 2020-10-16 NOTE — Addendum Note (Signed)
Addended by: Marrion Coy on: 10/16/2020 09:01 AM   Modules accepted: Orders

## 2020-10-16 NOTE — Addendum Note (Signed)
Addended by: Marrion Coy on: 10/16/2020 09:05 AM   Modules accepted: Orders

## 2020-10-22 ENCOUNTER — Ambulatory Visit: Payer: Medicare Other | Admitting: Neurology

## 2020-11-01 ENCOUNTER — Other Ambulatory Visit: Payer: Self-pay | Admitting: Family Medicine

## 2020-11-02 ENCOUNTER — Encounter: Payer: Self-pay | Admitting: Family Medicine

## 2020-11-09 ENCOUNTER — Telehealth: Payer: Medicare Other

## 2020-11-15 ENCOUNTER — Telehealth: Payer: Self-pay | Admitting: Pharmacist

## 2020-11-15 NOTE — Chronic Care Management (AMB) (Addendum)
I left the patient a message about his upcoming appointment on 11/16/2020 @ 8:30 AM  with the clinical pharmacist. He was asked to please have all medication on hand to review with the pharmacist.  Patient called back to cancel his appointment and did not wish to reschedule at this time. Patient has PCP appointment in April. Will plan to follow up after that.   Maia Breslow, [place] Assistant [phone]

## 2020-11-16 ENCOUNTER — Other Ambulatory Visit: Payer: Self-pay | Admitting: Family Medicine

## 2020-11-16 ENCOUNTER — Telehealth: Payer: Medicare Other

## 2021-01-15 ENCOUNTER — Other Ambulatory Visit: Payer: Self-pay | Admitting: Family Medicine

## 2021-01-15 ENCOUNTER — Other Ambulatory Visit: Payer: Self-pay | Admitting: Neurology

## 2021-01-18 ENCOUNTER — Ambulatory Visit: Payer: Medicare Other | Admitting: Family Medicine

## 2021-01-21 ENCOUNTER — Ambulatory Visit: Payer: Medicare Other | Admitting: Family Medicine

## 2021-01-22 ENCOUNTER — Other Ambulatory Visit: Payer: Self-pay

## 2021-01-22 ENCOUNTER — Encounter: Payer: Self-pay | Admitting: Family Medicine

## 2021-01-22 ENCOUNTER — Ambulatory Visit (INDEPENDENT_AMBULATORY_CARE_PROVIDER_SITE_OTHER): Payer: Medicare Other | Admitting: Family Medicine

## 2021-01-22 VITALS — BP 124/62 | HR 70 | Temp 97.7°F | Wt 256.1 lb

## 2021-01-22 DIAGNOSIS — E039 Hypothyroidism, unspecified: Secondary | ICD-10-CM | POA: Diagnosis not present

## 2021-01-22 DIAGNOSIS — E1165 Type 2 diabetes mellitus with hyperglycemia: Secondary | ICD-10-CM

## 2021-01-22 DIAGNOSIS — R5383 Other fatigue: Secondary | ICD-10-CM | POA: Diagnosis not present

## 2021-01-22 DIAGNOSIS — E785 Hyperlipidemia, unspecified: Secondary | ICD-10-CM

## 2021-01-22 LAB — POCT GLYCOSYLATED HEMOGLOBIN (HGB A1C): Hemoglobin A1C: 6.8 % — AB (ref 4.0–5.6)

## 2021-01-22 NOTE — Progress Notes (Signed)
Established Patient Office Visit  Subjective:  Patient ID: Rodney Martinez, male    DOB: 1950-11-24  Age: 70 y.o. MRN: [id number]  CC:  Chief Complaint  Patient presents with  . Follow-up    diabetes    HPI Rodney Martinez presents for medical follow-up.  He has history of type 2 diabetes, obstructive sleep apnea, hypothyroidism, myasthenia gravis, hyperlipidemia.  Diabetes currently managed with metformin and Jardiance.  He is on prednisone for myasthenia but is taper down currently to 5 mg daily and in process of tapering off.  Not monitoring sugars regularly.  Does get regular eye checkups.  No neuropathy symptoms.  Hyperlipidemia.  Treated with Crestor.  Recent lipids reviewed.  LDL cholesterol well controlled back in January.  Is still taking Crestor but had contemplating stopping after reading some type of article that suggested statins may not be that useful.  We explained that statins can be very useful both for primary and secondary prevention and high risk patients especially diabetics.  He is encouraged to continue the Crestor  He wonders if he may have COPD.  He has some coughing early morning productive of clear sputum.  No major dyspnea with activity.  No recent pulmonary function test.  Smokes cigars.  Hypothyroidism treated with low-dose levothyroxine 25 mcg daily.  TSH was at goal last August.  He does complain of general fatigue with activity.  No chest pain.  No dyspnea with activity.  Past Medical History:  Diagnosis Date  . Cataract 2020, 2021   Both eyes  . Diabetes mellitus without complication ([place])   . GERD 12/12/2009  . HYPERGLYCEMIA 12/12/2009  . Myasthenia gravis ([place])   . OBSTRUCTIVE SLEEP APNEA 08/14/2010  . 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 SCC (squamous cell carcinoma) 09/22/2018   left upper outer eyelid - CX3 + 5FU  . Sleep apnea    uses CPAP   . Thyroid disease   . TRANSAMINASES, SERUM, ELEVATED 12/12/2009     Past Surgical History:  Procedure Laterality Date  . BASAL CELL CARCINOMA EXCISION  2007   face (Mohs)  . CATARACT EXTRACTION Right 08/2019  . COLONOSCOPY  04/15/2012  . HERNIA REPAIR  9833   umbilical  . POLYPECTOMY      Family History  Problem Relation Age of Onset  . Diabetes Mother        type ll  . Heart disease Father        ?atrial fibrillation  . Diabetes Maternal Grandfather   . Diabetes Paternal Grandmother   . Diabetes Paternal Grandfather   . Healthy Sister   . Diabetes Brother   . Healthy Daughter   . Colon cancer Neg Hx   . Stomach cancer Neg Hx   . Colon polyps Neg Hx   . Esophageal cancer Neg Hx   . Rectal cancer Neg Hx     Social History   Socioeconomic History  . Marital status: Married    Spouse name: Not on file  . Number of children: 1  . Years of education: vet school  . Highest education level: Professional school degree (e.g., MD, DDS, DVM, JD)  Occupational History  . Occupation: retired Animal nutritionist  Tobacco Use  . Smoking status: Current Every Day Smoker    Years: 10.00    Types: Cigars  . Smokeless tobacco: Never Used  . Tobacco comment: Pt states that he smokes 6 cigars a day, he smoked  cigarettes for 10 years.  Vaping Use  . Vaping Use: Never used  Substance and Sexual Activity  . Alcohol use: Not Currently    Comment: rare  . Drug use: No  . Sexual activity: Not on file  Other Topics Concern  . Not on file  Social History Narrative   Lives with wife in a 2 story home.  Has one child. Retired Animal nutritionist.     Right handed    Social Determinants of Health   Financial Resource Strain: Low Risk   . Difficulty of Paying Living Expenses: Not hard at all  Food Insecurity: Not on file  Transportation Needs: No Transportation Needs  . Lack of Transportation (Medical): No  . Lack of Transportation (Non-Medical): No  Physical Activity: Not on file  Stress: Not on file  Social Connections: Not on file  Intimate Partner  Violence: Not on file    Outpatient Medications Prior to Visit  Medication Sig Dispense Refill  . Calcium-Magnesium-Vitamin D (CALCIUM 500 PO) Take 1 tablet by mouth at bedtime.     Marland Kitchen Emollient (DERMEND BRUISE FORMULA EX) Apply topically 2 (two) times daily as needed.    Arna Medici 25 MCG tablet TAKE 1 TABLET BY MOUTH ONCE DAILY BEFORE BREAKFAST . APPOINTMENT REQUIRED FOR FUTURE REFILLS 90 tablet 0  . glucose blood (ACCU-CHEK GUIDE) test strip Test once daily.  Dx e11.9 100 each 3  . JARDIANCE 10 MG TABS tablet Take 1 tablet by mouth once daily 90 tablet 0  . Melatonin 10 MG TABS Take 5 mg by mouth at bedtime.    . metFORMIN (GLUCOPHAGE XR) 750 MG 24 hr tablet Take 1 tablet (750 mg total) by mouth in the morning and at bedtime. 180 tablet 3  . mycophenolate (CELLCEPT) 500 MG tablet Take 1.5 tablets (750 mg total) by mouth 2 (two) times daily. 270 tablet 3  . omeprazole (PRILOSEC) 20 MG capsule Take 20 mg by mouth daily.    . predniSONE (DELTASONE) 5 MG tablet Take 1.5 tablets (7.5 mg total) by mouth daily with breakfast. 150 tablet 3  . pyridostigmine (MESTINON) 60 MG tablet Take 1 tablet by mouth twice daily 180 tablet 0  . tamsulosin (FLOMAX) 0.4 MG CAPS capsule Take 1 capsule by mouth once daily 90 capsule 0  . vitamin B-12 (CYANOCOBALAMIN) 500 MCG tablet Take 500 mcg by mouth daily.    Marland Kitchen amoxicillin (AMOXIL) 500 MG capsule Take 500 mg by mouth 3 (three) times daily.    . rosuvastatin (CRESTOR) 10 MG tablet Take 1 tablet (10 mg total) by mouth daily. (Patient not taking: Reported on 01/22/2021) 90 tablet 3   No facility-administered medications prior to visit.    No Known Allergies  ROS Review of Systems  Constitutional: Positive for fatigue.  Eyes: Negative for visual disturbance.  Respiratory: Negative for cough, chest tightness and shortness of breath.   Cardiovascular: Negative for chest pain, palpitations and leg swelling.  Neurological: Negative for dizziness, syncope,  weakness, light-headedness and headaches.      Objective:    Physical Exam Constitutional:      Appearance: He is well-developed.  HENT:     Right Ear: External ear normal.     Left Ear: External ear normal.  Eyes:     Pupils: Pupils are equal, round, and reactive to light.  Neck:     Thyroid: No thyromegaly.  Cardiovascular:     Rate and Rhythm: Normal rate and regular rhythm.  Pulmonary:  Effort: Pulmonary effort is normal. No respiratory distress.     Breath sounds: Normal breath sounds. No wheezing or rales.  Musculoskeletal:     Cervical back: Neck supple.  Neurological:     Mental Status: He is alert and oriented to person, place, and time.     BP 124/62 (BP Location: Left Arm, Patient Position: Sitting, Cuff Size: Large)   Pulse 70   Temp 97.7 F (36.5 C) (Oral)   Wt 256 lb 1.6 oz (116.2 kg)   SpO2 97%   BMI 35.72 kg/m  Wt Readings from Last 3 Encounters:  01/22/21 256 lb 1.6 oz (116.2 kg)  10/15/20 261 lb (118.4 kg)  05/14/20 257 lb (116.6 kg)     Health Maintenance Due  Topic Date Due  . COLONOSCOPY (Pts 45-31yrs Insurance coverage will need to be confirmed)  11/19/2019  . TETANUS/TDAP  12/13/2019  . COVID-19 Vaccine (3 - Moderna risk 4-dose series) 01/11/2020  . OPHTHALMOLOGY EXAM  07/07/2020  . FOOT EXAM  07/14/2020    There are no preventive care reminders to display for this patient.  Lab Results  Component Value Date   TSH 3.54 05/14/2020   Lab Results  Component Value Date   WBC 8.1 05/14/2020   HGB 14.3 05/14/2020   HCT 42.6 05/14/2020   MCV 95.6 05/14/2020   PLT 153.0 05/14/2020   Lab Results  Component Value Date   NA 141 05/14/2020   K 4.6 05/14/2020   CO2 22 05/14/2020   GLUCOSE 242 (H) 05/14/2020   BUN 17 05/14/2020   CREATININE 0.99 05/14/2020   BILITOT 0.5 10/16/2020   ALKPHOS 41 10/16/2020   AST 19 10/16/2020   ALT 37 10/16/2020   PROT 6.5 10/16/2020   ALBUMIN 4.7 10/16/2020   CALCIUM 9.3 05/14/2020   ANIONGAP  11 10/27/2019   GFR 74.39 05/14/2020   Lab Results  Component Value Date   CHOL 120 10/16/2020   Lab Results  Component Value Date   HDL 45.50 10/16/2020   Lab Results  Component Value Date   LDLCALC 82 11/02/2017   Lab Results  Component Value Date   TRIG 283.0 (H) 10/16/2020   Lab Results  Component Value Date   CHOLHDL 3 10/16/2020   Lab Results  Component Value Date   HGBA1C 6.8 (A) 01/22/2021      Assessment & Plan:   #1 type 2 diabetes improved with A1c today 6.8% -Continue metformin and Jardiance and recheck A1c in 6 months.  His A1c will likely continue to proving further as he is tapered off prednisone  #2 hypothyroidism -continue levothyroxine 25 mcg daily and recheck TSH at follow-up  #3 hyperlipidemia treated with Crestor. -Strongly advised patient to stay on Crestor.  #4 ongoing nicotine use with cigars.  Low motivation to quit.  We discussed possible evaluation for COPD but he declines pulmonary function testing at this point.  Minimally symptomatic.  No orders of the defined types were placed in this encounter.   Follow-up: Return in about 6 months (around 07/24/2021).    [name], MD

## 2021-02-01 ENCOUNTER — Encounter: Payer: Self-pay | Admitting: Family Medicine

## 2021-02-08 ENCOUNTER — Encounter: Payer: Self-pay | Admitting: Neurology

## 2021-02-08 ENCOUNTER — Other Ambulatory Visit: Payer: Self-pay

## 2021-02-08 ENCOUNTER — Ambulatory Visit: Payer: Medicare Other | Admitting: Neurology

## 2021-02-08 VITALS — BP 126/82 | HR 74 | Ht 71.0 in | Wt 252.0 lb

## 2021-02-08 DIAGNOSIS — G7000 Myasthenia gravis without (acute) exacerbation: Secondary | ICD-10-CM

## 2021-02-08 MED ORDER — PYRIDOSTIGMINE BROMIDE 60 MG PO TABS: 60.0000 mg | ORAL_TABLET | Freq: Two times a day (BID) | ORAL | 3 refills | Status: DC

## 2021-02-08 MED ORDER — PREDNISONE 5 MG PO TABS: 5.0000 mg | ORAL_TABLET | Freq: Every day | ORAL | 3 refills | Status: DC

## 2021-02-08 NOTE — Patient Instructions (Signed)
Reduce prednisone down to 5mg  every other day, alternating with 2.5mg   X 2 months Then, reduce to prednisone 2.5mg  daily (half tablet)  Return to clinic in 3 months

## 2021-02-08 NOTE — Progress Notes (Signed)
Follow-up Visit   Date: 02/09/2219    Rodney Martinez MRN: [id number] DOB: [birth year]   Interim History: Rodney Martinez is a 70 y.o. right-handed Caucasian male with OSA, hypothyroidism, and diabetes mellitus returning to the clinic for follow-up of seropositive myasthenia gravis.  The patient was accompanied to the clinic by self.  History of present illness: Starting in mid January 2018, he developed slurred speech, difficulty swallowing, droopy eyelids, and weakness with moving his tongue. MRI brain was negative.  There was high clinical suspicion for myasthenia so he was started on mestinon 30mg  three times daily; in the interim, AChR antibodies returned positive.  He saw my colleague, Dr. Tomi Likens initially who started him on prednisone 20mg  and increased mestinon to 60mg  three times daily and is here to establish care with me. Due to lack of improvement, his prednisone was titrated to 60mg  daily and IVIG was started in March 2018. Within a month, his dysphagia and double vision improved.  In May, slow prednisone taper was started.  He was noted to have low platelets (127) and referred to hematology for evaluation, especially since he was also started on Cellcept.  By September he was down to prednisone 20mg  and getting IVIG ever 3 weeks. Over 2019-2020, his IVIG has been tapered to every 8 weeks and eventually stopped in May 2020.  His Cellcept was increased to 750mg  BID in March 2020.  He continues a slow prednisone taper.   UPDATE 02/08/2021:  Here is here for follow-up visit.  He has being doing great on prednisone 5mg  daily and denies any breakthrough weakness.  He remains on Cellcept 750mg  BID and mestinon 60mg  BID (9a and 5p).  No problems with speech/swallowing, double vision, or limb weakness.    Medications:  Current Outpatient Medications on File Prior to Visit  Medication Sig Dispense Refill  . Calcium-Magnesium-Vitamin D (CALCIUM 500 PO) Take 1 tablet by mouth at bedtime.      Marland Kitchen Emollient (DERMEND BRUISE FORMULA EX) Apply topically 2 (two) times daily as needed.    Rodney Martinez 25 MCG tablet TAKE 1 TABLET BY MOUTH ONCE DAILY BEFORE BREAKFAST . APPOINTMENT REQUIRED FOR FUTURE REFILLS 90 tablet 0  . glucose blood (ACCU-CHEK GUIDE) test strip Test once daily.  Dx e11.9 100 each 3  . JARDIANCE 10 MG TABS tablet Take 1 tablet by mouth once daily 90 tablet 0  . Melatonin 10 MG TABS Take 5 mg by mouth at bedtime.    . metFORMIN (GLUCOPHAGE XR) 750 MG 24 hr tablet Take 1 tablet (750 mg total) by mouth in the morning and at bedtime. 180 tablet 3  . mycophenolate (CELLCEPT) 500 MG tablet Take 1.5 tablets (750 mg total) by mouth 2 (two) times daily. 270 tablet 3  . omeprazole (PRILOSEC) 20 MG capsule Take 20 mg by mouth daily.    . rosuvastatin (CRESTOR) 10 MG tablet Take 1 tablet (10 mg total) by mouth daily. 90 tablet 3  . tamsulosin (FLOMAX) 0.4 MG CAPS capsule Take 1 capsule by mouth once daily 90 capsule 0  . vitamin B-12 (CYANOCOBALAMIN) 500 MCG tablet Take 500 mcg by mouth daily.     No current facility-administered medications on file prior to visit.    Allergies: No Known Allergies  Vital Signs:  BP 126/82   Pulse 74   Ht 5\' 11"  (1.803 m)   Wt 252 lb (114.3 kg)   SpO2 97%   BMI 35.15 kg/m     Neurological Exam:  MENTAL STATUS including orientation to time, place, person, recent and remote memory, attention span and concentration, language, and fund of knowledge is normal.  Speech is normal, no dysarthria.    CRANIAL NERVES: Normal conjugate, extra-ocular eye movements in all directions of gaze. Mild left ptosis at rest, no worsening with sustained upgaze.  Face is symmetric.  Facial muscles are 5/5 throughout - Orbicularis oris and oculi is 5/5; buccinator 5/5.  Tongue strength is 5/5.    MOTOR:  Motor strength is 5/5 throughout.   COORDINATION/GAIT:  He is able to stand up without pushing off.   Gait narrow based and stable.  Data: AChR binding 1.01*,  blocking 61*, modulating 25*  MRI brain wwo contrast 10/27/2017:  1. No acute intracranial process or abnormal enhancement. 2. Mild parenchymal brain volume loss for age.  CT chest w contrast 02/03/2018: No acute chest abnormality. Specifically, no evidence for a mediastinal mass or abnormal thymic tissue. Probable scarring in the left lower lung. Indeterminate 4 mm nodular density in the right middle lobe. No follow-up needed if patient is low-risk. Non-contrast chest CT can be considered in 12 months if patient is high-risk.   Lab Results  Component Value Date   WBC 8.1 05/14/2020   HGB 14.3 05/14/2020   HCT 42.6 05/14/2020   MCV 95.6 05/14/2020   PLT 153.0 05/14/2020   Lab Results  Component Value Date   CREATININE 0.99 05/14/2020   BUN 17 05/14/2020   NA 141 05/14/2020   K 4.6 05/14/2020   CL 108 05/14/2020   CO2 22 05/14/2020   Lab Results  Component Value Date   HGBA1C 6.8 (A) 01/22/2021   Lab Results  Component Value Date   VITAMINB12 498 07/08/2019     IMPRESSION/PLAN: 1.  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  Minimal disease manifestation with mild left ptosis, no other weakness on exam.  Diagnosed 11/2017 with severe initial presentation requiring IVIG (long taper, dc'd 02/2019) and prednisone 60mg .  Cellcept added in 01/2018 and increased to 750mg  BID in 12/2018.    - Reduce prednisone to 5mg  alternating with 2.5mg  x 2 months, then 2.5mg  daily  - Continue Cellcept 750mg  BID  - Continue mestinon 60mg  BID at 9a and 5p  - Check CBC and CMP next visit  2.  Long-term corticosteroid use  -Continue calcium and vitamin D  -Continue Pepcid for GI prophylaxis  -Diabetes followed by PCP  Return to clinic in 3 months  Thank you for allowing me to participate in patient's care.  If I can answer any additional questions, I would be pleased to do so.    Sincerely,    Kaylum Shrum K. Posey Pronto, DO

## 2021-02-15 ENCOUNTER — Ambulatory Visit: Payer: Medicare Other | Admitting: Neurology

## 2021-02-20 ENCOUNTER — Telehealth: Payer: Self-pay | Admitting: Family Medicine

## 2021-02-20 NOTE — Telephone Encounter (Signed)
Left message for patient to call back and schedule Medicare Annual Wellness Visit (AWV) either virtually or in office.    AWV-I per [place] 09/05/14   please schedule at anytime with [facility] Nurse Health Advisor 1 or 2   This should be a 45 minute visit.  

## 2021-03-15 ENCOUNTER — Other Ambulatory Visit: Payer: Self-pay | Admitting: Family Medicine

## 2021-03-23 ENCOUNTER — Encounter: Payer: Self-pay | Admitting: Family Medicine

## 2021-03-23 ENCOUNTER — Other Ambulatory Visit: Payer: Self-pay | Admitting: Family Medicine

## 2021-03-25 MED ORDER — TAMSULOSIN HCL 0.4 MG PO CAPS: 0.4000 mg | ORAL_CAPSULE | Freq: Every day | ORAL | 0 refills | Status: DC

## 2021-04-11 ENCOUNTER — Telehealth: Payer: Self-pay | Admitting: Family Medicine

## 2021-04-11 NOTE — Telephone Encounter (Signed)
Left message for patient to call back and schedule Medicare Annual Wellness Visit (AWV) either virtually or in office.    AWV-I per [place] 09/05/14   please schedule at anytime with [facility] Nurse Health Advisor 1 or 2   This should be a 45 minute visit.

## 2021-04-22 ENCOUNTER — Encounter: Payer: Self-pay | Admitting: Family Medicine

## 2021-04-22 ENCOUNTER — Telehealth: Payer: Self-pay | Admitting: Family Medicine

## 2021-04-22 NOTE — Telephone Encounter (Signed)
Pt is calling in wanting to know how long he should stay away from his spouse that has tested positive for COVID that is in [place] and he is in [place].  Pt would like to have a call back.

## 2021-04-22 NOTE — Telephone Encounter (Signed)
Per CDC guidelines quarantine is recommended for 5 days.   Left message for patient to call back.

## 2021-04-23 NOTE — Telephone Encounter (Signed)
Spoke with the patient via mychart.

## 2021-04-29 NOTE — Progress Notes (Deleted)
Subjective:   Rodney Martinez is a 70 y.o. male who presents for an Initial Medicare Annual Wellness Visit.  Review of Systems    N/a       Objective:    There were no vitals filed for this visit. There is no height or weight on file to calculate BMI.  Advanced Directives 10/15/2020 05/14/2020 10/27/2019 07/08/2019 05/02/2019 04/26/2019 10/19/2018  Does Patient Have a Medical Advance Directive? Yes No Yes Yes Yes Yes No  Type of Paramedic of [place];Living will - Living will;Healthcare Power of Attorney Living will;Healthcare Power of Attorney - Living will;Healthcare Power of Attorney -  Does patient want to make changes to medical advance directive? - - No - Patient declined - - No - Patient declined -  Copy of [place] in Chart? - - - - - No - copy requested -  Would patient like information on creating a medical advance directive? - - - - - - -    Current Medications (verified) Outpatient Encounter Medications as of 04/29/2021  Medication Sig   Calcium-Magnesium-Vitamin D (CALCIUM 500 PO) Take 1 tablet by mouth at bedtime.    Emollient (DERMEND BRUISE FORMULA EX) Apply topically 2 (two) times daily as needed.   EUTHYROX 25 MCG tablet TAKE 1 TABLET BY MOUTH ONCE DAILY BEFORE BREAKFAST . APPOINTMENT REQUIRED FOR FUTURE REFILLS   glucose blood (ACCU-CHEK GUIDE) test strip Test once daily.  Dx e11.9   JARDIANCE 10 MG TABS tablet Take 1 tablet by mouth once daily   Melatonin 10 MG TABS Take 5 mg by mouth at bedtime.   metFORMIN (GLUCOPHAGE XR) 750 MG 24 hr tablet Take 1 tablet (750 mg total) by mouth in the morning and at bedtime.   mycophenolate (CELLCEPT) 500 MG tablet Take 1.5 tablets (750 mg total) by mouth 2 (two) times daily.   omeprazole (PRILOSEC) 20 MG capsule Take 20 mg by mouth daily.   predniSONE (DELTASONE) 5 MG tablet Take 1 tablet (5 mg total) by mouth daily with breakfast.   pyridostigmine (MESTINON) 60 MG tablet Take 1 tablet  (60 mg total) by mouth 2 (two) times daily. Refill when pt requests.   rosuvastatin (CRESTOR) 10 MG tablet Take 1 tablet (10 mg total) by mouth daily.   tamsulosin (FLOMAX) 0.4 MG CAPS capsule Take 1 capsule (0.4 mg total) by mouth daily.   vitamin B-12 (CYANOCOBALAMIN) 500 MCG tablet Take 500 mcg by mouth daily.   No facility-administered encounter medications on file as of 04/29/2021.    Allergies (verified) Patient has no known allergies.   History: Past Medical History:  Diagnosis Date   Cataract 2020, 2021   Both eyes   Diabetes mellitus without complication ([place])    GERD 12/12/2009   HYPERGLYCEMIA 12/12/2009   Myasthenia gravis ([place])    OBSTRUCTIVE SLEEP APNEA 08/14/2010   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   left upper back - tx p bx   SCC (squamous cell carcinoma) 09/22/2018   left upper outer eyelid - CX3 + 5FU   Sleep apnea    uses CPAP    Thyroid disease    TRANSAMINASES, SERUM, ELEVATED 12/12/2009   Past Surgical History:  Procedure Laterality Date   BASAL CELL CARCINOMA EXCISION  2007   face (Mohs)   CATARACT EXTRACTION Right 08/2019   COLONOSCOPY  04/15/2012   HERNIA REPAIR  [id number]   umbilical   POLYPECTOMY     Family History  Problem Relation Age of  Onset   Diabetes Mother        type ll   Heart disease Father        ?atrial fibrillation   Diabetes Maternal Grandfather    Diabetes Paternal Grandmother    Diabetes Paternal Grandfather    Healthy Sister    Diabetes Brother    Healthy Daughter    Colon cancer Neg Hx    Stomach cancer Neg Hx    Colon polyps Neg Hx    Esophageal cancer Neg Hx    Rectal cancer Neg Hx    Social History   Socioeconomic History   Marital status: Married    Spouse name: Not on file   Number of children: 1   Years of education: vet school   Highest education level: Professional school degree (e.g., MD, DDS, DVM, JD)  Occupational History   Occupation: retired Animal nutritionist  Tobacco Use   Smoking status: Every Day     Types: Cigars   Smokeless tobacco: Never   Tobacco comments:    Pt states that he smokes 6 cigars a day, he smoked cigarettes for 10 years.  Vaping Use   Vaping Use: Never used  Substance and Sexual Activity   Alcohol use: Not Currently    Comment: rare   Drug use: No   Sexual activity: Not on file  Other Topics Concern   Not on file  Social History Narrative   Lives with wife in a 2 story home.  Has one child. Retired Animal nutritionist.     Right handed    Social Determinants of Health   Financial Resource Strain: Low Risk    Difficulty of Paying Living Expenses: Not hard at all  Food Insecurity: Not on file  Transportation Needs: No Transportation Needs   Lack of Transportation (Medical): No   Lack of Transportation (Non-Medical): No  Physical Activity: Not on file  Stress: Not on file  Social Connections: Not on file    Tobacco Counseling Ready to quit: Not Answered Counseling given: Not Answered Tobacco comments: Pt states that he smokes 6 cigars a day, he smoked cigarettes for 10 years.   Clinical Intake:                 Diabetic?yes Nutrition Risk Assessment:  Has the patient had any N/V/D within the last 2 months?  {YES/NO:21197} Does the patient have any non-healing wounds?  {YES/NO:21197} Has the patient had any unintentional weight loss or weight gain?  {YES/NO:21197}  Diabetes:  Is the patient diabetic?  {YES/NO:21197} If diabetic, was a CBG obtained today?  {YES/NO:21197} Did the patient bring in their glucometer from home?  {YES/NO:21197} How often do you monitor your CBG's? ***.   Financial Strains and Diabetes Management:  Are you having any financial strains with the device, your supplies or your medication? {YES/NO:21197}.  Does the patient want to be seen by Chronic Care Management for management of their diabetes?  {YES/NO:21197} Would the patient like to be referred to a Nutritionist or for Diabetic Management?   {YES/NO:21197}  Diabetic Exams:  {Diabetic Eye Exam:2101801} {Diabetic Foot Exam:2101802}          Activities of Daily Living No flowsheet data found.  Patient Care Team: [name], MD as PCP - [place], [place], DO as Consulting Physician (Neurology) Derek Jack, MD as Consulting Physician (Hematology and Oncology) Viona Gilmore, [facility] as Pharmacist (Pharmacist) Lavonna Monarch, MD as Consulting Physician (Dermatology)  Indicate any recent Medical Services you may have  received from other than [facility] providers in the past year (date may be approximate).     Assessment:   This is a routine wellness examination for Rodney Martinez.  Hearing/Vision screen No results found.  Dietary issues and exercise activities discussed:     Goals Addressed   None    Depression Screen PHQ 2/9 Scores 11/02/2017 04/07/2016 12/05/2013  PHQ - 2 Score 1 0 0    Fall Risk Fall Risk  10/15/2020 05/14/2020 10/10/2019 07/08/2019 05/02/2019  Falls in the past year? 0 0 1 0 0  Number falls in past yr: 0 0 0 0 0  Injury with Fall? 0 0 0 0 0  Risk for fall due to : - - Impaired balance/gait - -  Follow up - - Falls evaluation completed - -    FALL RISK PREVENTION PERTAINING TO THE HOME:  Any stairs in or around the home? {YES/NO:21197} If so, are there any without handrails? {YES/NO:21197} Home free of loose throw rugs in walkways, pet beds, electrical cords, etc? {YES/NO:21197} Adequate lighting in your home to reduce risk of falls? {YES/NO:21197}  ASSISTIVE DEVICES UTILIZED TO PREVENT FALLS:  Life alert? {YES/NO:21197} Use of a cane, walker or w/c? {YES/NO:21197} Grab bars in the bathroom? {YES/NO:21197} Shower chair or bench in shower? {YES/NO:21197} Elevated toilet seat or a handicapped toilet? {YES/NO:21197}  TIMED UP AND GO:  Was the test performed? {YES/NO:21197}.  Length of time to ambulate 10 feet: *** sec.   {Appearance of YN:9739091  Cognitive Function:         Immunizations Immunization History  Administered Date(s) Administered   Fluad Quad(high Dose 65+) 07/15/2019   Influenza, High Dose Seasonal PF 11/02/2017   Moderna Sars-Covid-2 Vaccination 11/11/2019, 12/14/2019   Pneumococcal Conjugate-13 11/02/2017   Pneumococcal Polysaccharide-23 12/12/2009, 07/15/2019   Td 12/12/2009   Zoster, Live 02/16/2012    {TDAP status:2101805}  {Flu Vaccine status:2101806}  {Pneumococcal vaccine status:2101807}  {Covid-19 vaccine status:2101808}  Qualifies for Shingles Vaccine? {YES/NO:21197}  Zostavax completed {YES/NO:21197}  {Shingrix Completed?:2101804}  Screening Tests Health Maintenance  Topic Date Due   Zoster Vaccines- Shingrix (1 of 2) Never done   COLONOSCOPY (Pts 45-5yr Insurance coverage will need to be confirmed)  11/19/2019   TETANUS/TDAP  12/13/2019   COVID-19 Vaccine (3 - Moderna risk series) 01/11/2020   OPHTHALMOLOGY EXAM  07/07/2020   FOOT EXAM  07/14/2020   INFLUENZA VACCINE  05/06/2021   HEMOGLOBIN A1C  07/24/2021   URINE MICROALBUMIN  09/25/2021   Hepatitis C Screening  Completed   PNA vac Low Risk Adult  Completed   HPV VACCINES  Aged Out    Health Maintenance  Health Maintenance Due  Topic Date Due   Zoster Vaccines- Shingrix (1 of 2) Never done   COLONOSCOPY (Pts 45-470yrInsurance coverage will need to be confirmed)  11/19/2019   TETANUS/TDAP  12/13/2019   COVID-19 Vaccine (3 - Moderna risk series) 01/11/2020   OPHTHALMOLOGY EXAM  07/07/2020   FOOT EXAM  07/14/2020    {Colorectal cancer screening:2101809}  Lung Cancer Screening: (Low Dose CT Chest recommended if Age 72-80ears, 30 pack-year currently smoking OR have quit w/in 15years.) {DOES NOT does:27190::"does not"} qualify.   Lung Cancer Screening Referral: ***  Additional Screening:  Hepatitis C Screening: {DOES NOT does:27190::"does not"} qualify; Completed ***  Vision Screening: Recommended annual ophthalmology exams for early  detection of glaucoma and other disorders of the eye. Is the patient up to date with their annual eye exam?  {YES/NO:21197} Who is the provider  or what is the name of the office in which the patient attends annual eye exams? *** If pt is not established with a provider, would they like to be referred to a provider to establish care? {YES/NO:21197}.   Dental Screening: Recommended annual dental exams for proper oral hygiene  Community Resource Referral / Chronic Care Management: CRR required this visit?  {YES/NO:21197}  CCM required this visit?  {YES/NO:21197}     Plan:     I have personally reviewed and noted the following in the patient's chart:   Medical and social history Use of alcohol, tobacco or illicit drugs  Current medications and supplements including opioid prescriptions. {Opioid Prescriptions:[phone]} Functional ability and status Nutritional status Physical activity Advanced directives List of other physicians Hospitalizations, surgeries, and ER visits in previous 12 months Vitals Screenings to include cognitive, depression, and falls Referrals and appointments  In addition, I have reviewed and discussed with patient certain preventive protocols, quality metrics, and best practice recommendations. A written personalized care plan for preventive services as well as general preventive health recommendations were provided to patient.     Randel Pigg, LPN   [id number]   Nurse Notes: ***

## 2021-05-13 ENCOUNTER — Other Ambulatory Visit: Payer: Self-pay

## 2021-05-13 ENCOUNTER — Other Ambulatory Visit: Payer: Self-pay | Admitting: Family Medicine

## 2021-05-13 ENCOUNTER — Ambulatory Visit (INDEPENDENT_AMBULATORY_CARE_PROVIDER_SITE_OTHER): Payer: Medicare Other

## 2021-05-13 VITALS — BP 122/62 | HR 52 | Temp 97.8°F | Ht 70.0 in | Wt 254.0 lb

## 2021-05-13 DIAGNOSIS — Z1211 Encounter for screening for malignant neoplasm of colon: Secondary | ICD-10-CM | POA: Diagnosis not present

## 2021-05-13 DIAGNOSIS — Z0000 Encounter for general adult medical examination without abnormal findings: Secondary | ICD-10-CM | POA: Diagnosis not present

## 2021-05-13 NOTE — Progress Notes (Signed)
Subjective:   Rodney Martinez is a 70 y.o. male who presents for an Initial Medicare Annual Wellness Visit.  Review of Systems    N/a       Objective:    There were no vitals filed for this visit. There is no height or weight on file to calculate BMI.  Advanced Directives 10/15/2020 05/14/2020 10/27/2019 07/08/2019 05/02/2019 04/26/2019 10/19/2018  Does Patient Have a Medical Advance Directive? Yes No Yes Yes Yes Yes No  Type of Paramedic of [place];Living will - Living will;Healthcare Power of Attorney Living will;Healthcare Power of Attorney - Living will;Healthcare Power of Attorney -  Does patient want to make changes to medical advance directive? - - No - Patient declined - - No - Patient declined -  Copy of [place] in Chart? - - - - - No - copy requested -  Would patient like information on creating a medical advance directive? - - - - - - -    Current Medications (verified) Outpatient Encounter Medications as of 05/13/2021  Medication Sig   Calcium-Magnesium-Vitamin D (CALCIUM 500 PO) Take 1 tablet by mouth at bedtime.    Emollient (DERMEND BRUISE FORMULA EX) Apply topically 2 (two) times daily as needed.   EUTHYROX 25 MCG tablet TAKE 1 TABLET BY MOUTH ONCE DAILY BEFORE BREAKFAST . APPOINTMENT REQUIRED FOR FUTURE REFILLS   glucose blood (ACCU-CHEK GUIDE) test strip Test once daily.  Dx e11.9   JARDIANCE 10 MG TABS tablet Take 1 tablet by mouth once daily   Melatonin 10 MG TABS Take 5 mg by mouth at bedtime.   metFORMIN (GLUCOPHAGE XR) 750 MG 24 hr tablet Take 1 tablet (750 mg total) by mouth in the morning and at bedtime.   mycophenolate (CELLCEPT) 500 MG tablet Take 1.5 tablets (750 mg total) by mouth 2 (two) times daily.   omeprazole (PRILOSEC) 20 MG capsule Take 20 mg by mouth daily.   predniSONE (DELTASONE) 5 MG tablet Take 1 tablet (5 mg total) by mouth daily with breakfast.   pyridostigmine (MESTINON) 60 MG tablet Take 1 tablet  (60 mg total) by mouth 2 (two) times daily. Refill when pt requests.   rosuvastatin (CRESTOR) 10 MG tablet Take 1 tablet (10 mg total) by mouth daily.   tamsulosin (FLOMAX) 0.4 MG CAPS capsule Take 1 capsule (0.4 mg total) by mouth daily.   vitamin B-12 (CYANOCOBALAMIN) 500 MCG tablet Take 500 mcg by mouth daily.   No facility-administered encounter medications on file as of 05/13/2021.    Allergies (verified) Patient has no known allergies.   History: Past Medical History:  Diagnosis Date   Cataract 2020, 2021   Both eyes   Diabetes mellitus without complication ([place])    GERD 12/12/2009   HYPERGLYCEMIA 12/12/2009   Myasthenia gravis ([place])    OBSTRUCTIVE SLEEP APNEA 08/14/2010   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   left upper back - tx p bx   SCC (squamous cell carcinoma) 09/22/2018   left upper outer eyelid - CX3 + 5FU   Sleep apnea    uses CPAP    Thyroid disease    TRANSAMINASES, SERUM, ELEVATED 12/12/2009   Past Surgical History:  Procedure Laterality Date   BASAL CELL CARCINOMA EXCISION  2007   face (Mohs)   CATARACT EXTRACTION Right 08/2019   COLONOSCOPY  04/15/2012   HERNIA REPAIR  [id number]   umbilical   POLYPECTOMY     Family History  Problem Relation Age of  Onset   Diabetes Mother        type ll   Heart disease Father        ?atrial fibrillation   Diabetes Maternal Grandfather    Diabetes Paternal Grandmother    Diabetes Paternal Grandfather    Healthy Sister    Diabetes Brother    Healthy Daughter    Colon cancer Neg Hx    Stomach cancer Neg Hx    Colon polyps Neg Hx    Esophageal cancer Neg Hx    Rectal cancer Neg Hx    Social History   Socioeconomic History   Marital status: Married    Spouse name: Not on file   Number of children: 1   Years of education: vet school   Highest education level: Professional school degree (e.g., MD, DDS, DVM, JD)  Occupational History   Occupation: retired Animal nutritionist  Tobacco Use   Smoking status: Every Day     Types: Cigars   Smokeless tobacco: Never   Tobacco comments:    Pt states that he smokes 6 cigars a day, he smoked cigarettes for 10 years.  Vaping Use   Vaping Use: Never used  Substance and Sexual Activity   Alcohol use: Not Currently    Comment: rare   Drug use: No   Sexual activity: Not on file  Other Topics Concern   Not on file  Social History Narrative   Lives with wife in a 2 story home.  Has one child. Retired Animal nutritionist.     Right handed    Social Determinants of Health   Financial Resource Strain: Low Risk    Difficulty of Paying Living Expenses: Not hard at all  Food Insecurity: Not on file  Transportation Needs: No Transportation Needs   Lack of Transportation (Medical): No   Lack of Transportation (Non-Medical): No  Physical Activity: Not on file  Stress: Not on file  Social Connections: Not on file    Tobacco Counseling Ready to quit: Not Answered Counseling given: Not Answered Tobacco comments: Pt states that he smokes 6 cigars a day, he smoked cigarettes for 10 years.   Clinical Intake:                 Diabetic?yes Nutrition Risk Assessment:  Has the patient had any N/V/D within the last 2 months?  No  Does the patient have any non-healing wounds?  No  Has the patient had any unintentional weight loss or weight gain?  No   Diabetes:  Is the patient diabetic?  Yes  If diabetic, was a CBG obtained today?  No  Did the patient bring in their glucometer from home?  No  How often do you monitor your CBG's? never.   Financial Strains and Diabetes Management:  Are you having any financial strains with the device, your supplies or your medication? No .  Does the patient want to be seen by Chronic Care Management for management of their diabetes?  No  Would the patient like to be referred to a Nutritionist or for Diabetic Management?  No   Diabetic Exams:  Diabetic Eye Exam: Overdue for diabetic eye exam. Pt has been advised about the  importance in completing this exam. Patient advised to call and schedule an eye exam. Diabetic Foot Exam: Overdue, Pt has been advised about the importance in completing this exam. Pt is scheduled for diabetic foot exam on next office visit .          Activities of  Daily Living No flowsheet data found.  Patient Care Team: [name], MD as PCP - General Alda Berthold, DO as Consulting Physician (Neurology) Derek Jack, MD as Consulting Physician (Hematology and Oncology) Viona Gilmore, [facility] as Pharmacist (Pharmacist) Lavonna Monarch, MD as Consulting Physician (Dermatology)  Indicate any recent Medical Services you may have received from other than [facility] providers in the past year (date may be approximate).     Assessment:   This is a routine wellness examination for [place].  Hearing/Vision screen No results found.  Dietary issues and exercise activities discussed:     Goals Addressed   None    Depression Screen PHQ 2/9 Scores 11/02/2017 04/07/2016 12/05/2013  PHQ - 2 Score 1 0 0    Fall Risk Fall Risk  10/15/2020 05/14/2020 10/10/2019 07/08/2019 05/02/2019  Falls in the past year? 0 0 1 0 0  Number falls in past yr: 0 0 0 0 0  Injury with Fall? 0 0 0 0 0  Risk for fall due to : - - Impaired balance/gait - -  Follow up - - Falls evaluation completed - -    FALL RISK PREVENTION PERTAINING TO THE HOME:  Any stairs in or around the home? Yes  If so, are there any without handrails? No  Home free of loose throw rugs in walkways, pet beds, electrical cords, etc? Yes  Adequate lighting in your home to reduce risk of falls? Yes   ASSISTIVE DEVICES UTILIZED TO PREVENT FALLS:  Life alert? No  Use of a cane, walker or w/c? No  Grab bars in the bathroom? No  Shower chair or bench in shower? No  Elevated toilet seat or a handicapped toilet? No   TIMED UP AND GO:  Was the test performed? Yes .  Length of time to ambulate 10 feet: 10 sec.   Gait steady and  fast without use of assistive device  Cognitive Function:  Normal cognitive status assessed by direct observation by this Nurse Health Advisor. No abnormalities found.        Immunizations Immunization History  Administered Date(s) Administered   Fluad Quad(high Dose 65+) 07/15/2019   Influenza, High Dose Seasonal PF 11/02/2017   Moderna Sars-Covid-2 Vaccination 11/11/2019, 12/14/2019   Pneumococcal Conjugate-13 11/02/2017   Pneumococcal Polysaccharide-23 12/12/2009, 07/15/2019   Td 12/12/2009   Zoster, Live 02/16/2012    TDAP status: Up to date  Flu Vaccine status: Up to date  Pneumococcal vaccine status: Up to date  Covid-19 vaccine status: Completed vaccines  Qualifies for Shingles Vaccine? Yes   Zostavax completed No   Shingrix Completed?: No.    Education has been provided regarding the importance of this vaccine. Patient has been advised to call insurance company to determine out of pocket expense if they have not yet received this vaccine. Advised may also receive vaccine at local pharmacy or Health Dept. Verbalized acceptance and understanding.  Screening Tests Health Maintenance  Topic Date Due   Zoster Vaccines- Shingrix (1 of 2) Never done   COLONOSCOPY (Pts 45-47yr Insurance coverage will need to be confirmed)  11/19/2019   TETANUS/TDAP  12/13/2019   COVID-19 Vaccine (3 - Moderna risk series) 01/11/2020   OPHTHALMOLOGY EXAM  07/07/2020   FOOT EXAM  07/14/2020   INFLUENZA VACCINE  05/06/2021   HEMOGLOBIN A1C  07/24/2021   URINE MICROALBUMIN  09/25/2021   Hepatitis C Screening  Completed   PNA vac Low Risk Adult  Completed   HPV VACCINES  Aged Out  Health Maintenance  Health Maintenance Due  Topic Date Due   Zoster Vaccines- Shingrix (1 of 2) Never done   COLONOSCOPY (Pts 45-8yr Insurance coverage will need to be confirmed)  11/19/2019   TETANUS/TDAP  12/13/2019   COVID-19 Vaccine (3 - Moderna risk series) 01/11/2020   OPHTHALMOLOGY EXAM   07/07/2020   FOOT EXAM  07/14/2020   INFLUENZA VACCINE  05/06/2021    Colorectal cancer screening: Referral to GI placed 05/13/2021. Pt aware the office will call re: appt.  Lung Cancer Screening: (Low Dose CT Chest recommended if Age 72-80years, 30 pack-year currently smoking OR have quit w/in 15years.) does not qualify.   Lung Cancer Screening Referral: n/a  Additional Screening:  Hepatitis C Screening: does not qualify; Completed 04/19/2018  Vision Screening: Recommended annual ophthalmology exams for early detection of glaucoma and other disorders of the eye. Is the patient up to date with their annual eye exam?  No  Who is the provider or what is the name of the office in which the patient attends annual eye exams? Declined referral  If pt is not established with a provider, would they like to be referred to a provider to establish care? No .   Dental Screening: Recommended annual dental exams for proper oral hygiene  Community Resource Referral / Chronic Care Management: CRR required this visit?  No   CCM required this visit?  No      Plan:     I have personally reviewed and noted the following in the patient's chart:   Medical and social history Use of alcohol, tobacco or illicit drugs  Current medications and supplements including opioid prescriptions. Patient is not currently taking opioid prescriptions. Functional ability and status Nutritional status Physical activity Advanced directives List of other physicians Hospitalizations, surgeries, and ER visits in previous 12 months Vitals Screenings to include cognitive, depression, and falls Referrals and appointments  In addition, I have reviewed and discussed with patient certain preventive protocols, quality metrics, and best practice recommendations. A written personalized care plan for preventive services as well as general preventive health recommendations were provided to patient.     LRandel Pigg  LPN   [id number]  Nurse Notes: none

## 2021-05-13 NOTE — Patient Instructions (Signed)
[name] , Thank you for taking time to come for your Medicare Wellness Visit. I appreciate your ongoing commitment to your health goals. Please review the following plan we discussed and let me know if I can assist you in the future.   Screening recommendations/referrals: Colonoscopy: referral completed 05/13/2021 Recommended yearly ophthalmology/optometry visit for glaucoma screening and checkup Recommended yearly dental visit for hygiene and checkup  Vaccinations: Influenza vaccine: due in fall 2022 Pneumococcal vaccine: completed series  Tdap vaccine: due with injury  Shingles vaccine: will obtain local pharmacy     Advanced directives: none   Conditions/risks identified: none   Next appointment: 05/17/2021  0915am Dr.Burchette   Preventive Care 54 Years and Older, Male Preventive care refers to lifestyle choices and visits with your health care provider that can promote health and wellness. What does preventive care include? A yearly physical exam. This is also called an annual well check. Dental exams once or twice a year. Routine eye exams. Ask your health care provider how often you should have your eyes checked. Personal lifestyle choices, including: Daily care of your teeth and gums. Regular physical activity. Eating a healthy diet. Avoiding tobacco and drug use. Limiting alcohol use. Practicing safe sex. Taking low doses of aspirin every day. Taking vitamin and mineral supplements as recommended by your health care provider. What happens during an annual well check? The services and screenings done by your health care provider during your annual well check will depend on your age, overall health, lifestyle risk factors, and family history of disease. Counseling  Your health care provider may ask you questions about your: Alcohol use. Tobacco use. Drug use. Emotional well-being. Home and relationship well-being. Sexual activity. Eating habits. History of  falls. Memory and ability to understand (cognition). Work and work Statistician. Screening  You may have the following tests or measurements: Height, weight, and BMI. Blood pressure. Lipid and cholesterol levels. These may be checked every 5 years, or more frequently if you are over 55 years old. Skin check. Lung cancer screening. You may have this screening every year starting at age 2 if you have a 30-pack-year history of smoking and currently smoke or have quit within the past 15 years. Fecal occult blood test (FOBT) of the stool. You may have this test every year starting at age 28. Flexible sigmoidoscopy or colonoscopy. You may have a sigmoidoscopy every 5 years or a colonoscopy every 10 years starting at age 88. Prostate cancer screening. Recommendations will vary depending on your family history and other risks. Hepatitis C blood test. Hepatitis B blood test. Sexually transmitted disease (STD) testing. Diabetes screening. This is done by checking your blood sugar (glucose) after you have not eaten for a while (fasting). You may have this done every 1-3 years. Abdominal aortic aneurysm (AAA) screening. You may need this if you are a current or former smoker. Osteoporosis. You may be screened starting at age 80 if you are at high risk. Talk with your health care provider about your test results, treatment options, and if necessary, the need for more tests. Vaccines  Your health care provider may recommend certain vaccines, such as: Influenza vaccine. This is recommended every year. Tetanus, diphtheria, and acellular pertussis (Tdap, Td) vaccine. You may need a Td booster every 10 years. Zoster vaccine. You may need this after age 37. Pneumococcal 13-valent conjugate (PCV13) vaccine. One dose is recommended after age 59. Pneumococcal polysaccharide (PPSV23) vaccine. One dose is recommended after age 79. Talk to your health  care provider about which screenings and vaccines you need and  how often you need them. This information is not intended to replace advice given to you by your health care provider. Make sure you discuss any questions you have with your health care provider. Document Released: 10/19/2015 Document Revised: 06/11/2016 Document Reviewed: 07/24/2015 Elsevier Interactive Patient Education  2017 [place] Prevention in the Home Falls can cause injuries. They can happen to people of all ages. There are many things you can do to make your home safe and to help prevent falls. What can I do on the outside of my home? Regularly fix the edges of walkways and driveways and fix any cracks. Remove anything that might make you trip as you walk through a door, such as a raised step or threshold. Trim any bushes or trees on the path to your home. Use bright outdoor lighting. Clear any walking paths of anything that might make someone trip, such as rocks or tools. Regularly check to see if handrails are loose or broken. Make sure that both sides of any steps have handrails. Any raised decks and porches should have guardrails on the edges. Have any leaves, snow, or ice cleared regularly. Use sand or salt on walking paths during winter. Clean up any spills in your garage right away. This includes oil or grease spills. What can I do in the bathroom? Use night lights. Install grab bars by the toilet and in the tub and shower. Do not use towel bars as grab bars. Use non-skid mats or decals in the tub or shower. If you need to sit down in the shower, use a plastic, non-slip stool. Keep the floor dry. Clean up any water that spills on the floor as soon as it happens. Remove soap buildup in the tub or shower regularly. Attach bath mats securely with double-sided non-slip rug tape. Do not have throw rugs and other things on the floor that can make you trip. What can I do in the bedroom? Use night lights. Make sure that you have a light by your bed that is easy to  reach. Do not use any sheets or blankets that are too big for your bed. They should not hang down onto the floor. Have a firm chair that has side arms. You can use this for support while you get dressed. Do not have throw rugs and other things on the floor that can make you trip. What can I do in the kitchen? Clean up any spills right away. Avoid walking on wet floors. Keep items that you use a lot in easy-to-reach places. If you need to reach something above you, use a strong step stool that has a grab bar. Keep electrical cords out of the way. Do not use floor polish or wax that makes floors slippery. If you must use wax, use non-skid floor wax. Do not have throw rugs and other things on the floor that can make you trip. What can I do with my stairs? Do not leave any items on the stairs. Make sure that there are handrails on both sides of the stairs and use them. Fix handrails that are broken or loose. Make sure that handrails are as long as the stairways. Check any carpeting to make sure that it is firmly attached to the stairs. Fix any carpet that is loose or worn. Avoid having throw rugs at the top or bottom of the stairs. If you do have throw rugs, attach them to  the floor with carpet tape. Make sure that you have a light switch at the top of the stairs and the bottom of the stairs. If you do not have them, ask someone to add them for you. What else can I do to help prevent falls? Wear shoes that: Do not have high heels. Have rubber bottoms. Are comfortable and fit you well. Are closed at the toe. Do not wear sandals. If you use a stepladder: Make sure that it is fully opened. Do not climb a closed stepladder. Make sure that both sides of the stepladder are locked into place. Ask someone to hold it for you, if possible. Clearly mark and make sure that you can see: Any grab bars or handrails. First and last steps. Where the edge of each step is. Use tools that help you move  around (mobility aids) if they are needed. These include: Canes. Walkers. Scooters. Crutches. Turn on the lights when you go into a dark area. Replace any light bulbs as soon as they burn out. Set up your furniture so you have a clear path. Avoid moving your furniture around. If any of your floors are uneven, fix them. If there are any pets around you, be aware of where they are. Review your medicines with your doctor. Some medicines can make you feel dizzy. This can increase your chance of falling. Ask your doctor what other things that you can do to help prevent falls. This information is not intended to replace advice given to you by your health care provider. Make sure you discuss any questions you have with your health care provider. Document Released: 07/19/2009 Document Revised: 02/28/2016 Document Reviewed: 10/27/2014 Elsevier Interactive Patient Education  2017 [employer].

## 2021-05-14 ENCOUNTER — Telehealth: Payer: Self-pay | Admitting: Pharmacist

## 2021-05-14 NOTE — Chronic Care Management (AMB) (Signed)
Chronic Care Management Pharmacy Assistant   Name: Rodney Martinez  MRN: [id number] DOB: [birth year]   Reason for Encounter: Disease State/ Diabetes Assessment Call.    Conditions to be addressed/monitored: DMII  Recent office visits:  01/22/21 [name] MD (PCP) - seen for type 2 diabetes with hyperglycemia and other issues. Discontinued amoxicillin. Follow up in 6 months.    Recent consult visits:  02/08/21 Narda Amber DO (Neurology) - seen for myasthenia gravis without exacerbation. Changed prednisone to '5mg'$  daily with breakfast from 7.5 mg daily with breakfast. Changed pyridostigmine bromide '60mg'$  two times daily. Follow up in 3 months.    Hospital visits:  None in previous 6 months  Medications: Outpatient Encounter Medications as of 05/14/2021  Medication Sig   Calcium-Magnesium-Vitamin D (CALCIUM 500 PO) Take 1 tablet by mouth at bedtime.    Emollient (DERMEND BRUISE FORMULA EX) Apply topically 2 (two) times daily as needed.   EUTHYROX 25 MCG tablet TAKE 1 TABLET BY MOUTH ONCE DAILY BEFORE BREAKFAST . APPOINTMENT REQUIRED FOR FUTURE REFILLS   glucose blood (ACCU-CHEK GUIDE) test strip Test once daily.  Dx e11.9   JARDIANCE 10 MG TABS tablet Take 1 tablet by mouth once daily   Melatonin 10 MG TABS Take 5 mg by mouth at bedtime.   metFORMIN (GLUCOPHAGE XR) 750 MG 24 hr tablet Take 1 tablet (750 mg total) by mouth in the morning and at bedtime.   mycophenolate (CELLCEPT) 500 MG tablet Take 1.5 tablets (750 mg total) by mouth 2 (two) times daily.   omeprazole (PRILOSEC) 20 MG capsule Take 20 mg by mouth daily.   predniSONE (DELTASONE) 5 MG tablet Take 1 tablet (5 mg total) by mouth daily with breakfast.   pyridostigmine (MESTINON) 60 MG tablet Take 1 tablet (60 mg total) by mouth 2 (two) times daily. Refill when pt requests.   rosuvastatin (CRESTOR) 10 MG tablet Take 1 tablet (10 mg total) by mouth daily.   tamsulosin (FLOMAX) 0.4 MG CAPS capsule Take 1 capsule (0.4 mg  total) by mouth daily.   vitamin B-12 (CYANOCOBALAMIN) 500 MCG tablet Take 500 mcg by mouth daily.   No facility-administered encounter medications on file as of 05/14/2021.   Fill History: JARDIANCE '10MG'$  TAB 03/30/2021 90   LEVOTHYROXIN 25MCG TAB 01/30/2021 90   MYCOPHENOLATE '500MG'$  TAB 03/10/2021 90   PYRIDOSTIGM '60MG'$     TAB 04/13/2021 90   ROSUVASTATIN '10MG'$  TAB 04/26/2021 90   TAMSULOSIN 0.'4MG'$     CAP 03/25/2021 90   METFORMIN ER '750MG'$   TAB 05/08/2021 90   Recent Relevant Labs: Lab Results  Component Value Date/Time   HGBA1C 6.8 (A) 01/22/2021 09:29 AM   HGBA1C 7.3 (H) 10/16/2020 09:07 AM   HGBA1C 7.5 (H) 05/14/2020 11:38 AM   MICROALBUR <0.7 07/15/2019 07:34 AM    Kidney Function Lab Results  Component Value Date/Time   CREATININE 0.99 05/14/2020 11:42 AM   CREATININE 0.89 10/27/2019 01:57 PM   CREATININE 0.86 07/20/2018 03:29 PM   CREATININE 0.80 02/25/2018 12:00 AM   GFR 74.39 05/14/2020 11:42 AM   GFRNONAA >60 10/27/2019 01:57 PM   GFRNONAA 91 02/25/2018 12:00 AM   GFRAA >60 10/27/2019 01:57 PM   GFRAA 106 02/25/2018 12:00 AM    Current antihyperglycemic regimen:  Jardiance '10mg'$  - take 1 tablet once daily. Metformin '750mg'$  - 1 tablet in the am and 1 tablet at bedtime. What recent interventions/DTPs have been made to improve glycemic control:  None.  Have there been any recent hospitalizations  or ED visits since last visit with CPP? No Patient hypoglycemic symptoms, including  Patient hyperglycemic symptoms, including  How often are you checking your blood sugar?  What are your blood sugars ranging?  Fasting:  Before meals:  After meals:  Bedtime:  During the week, how often does your blood glucose drop below 70?  Are you checking your feet daily/regularly?   Adherence Review: Is the patient currently on a STATIN medication? Yes Is the patient currently on ACE/ARB medication? No Does the patient have >5 day gap between last estimated fill dates?  No  Patient wishes to unenroll and does not see the point in the CCM program when he sees his PCP for all of his medical conditions. I explained in detail the CCM program and the benefit. Patient wants to be unenrolled. Message sent to [facility] pryor to make aware.   Care Gaps:  AWV - completed on 05/13/21 Zoster vaccines - never done Colonoscopy - overdue since 11/19/19 Tetanus/TDAP - overdue since 12/13/19 Covid - 19 vaccine booster 3 - overdue since 01/11/20 Ophthalmology exam - overdue since 07/07/20 Foot exam - overdue since 07/14/20 Influenza vaccine - due  Star Rating Drugs:  Jardiance '10mg'$  - last filled on 03/30/21 90DS at [facility] Metformin '750mg'$  - last filled on 05/08/21 90DS at [facility] Rosuvastatin '10mg'$  - last filled on 04/26/21 90DS at [facility] [phone]

## 2021-05-15 ENCOUNTER — Other Ambulatory Visit: Payer: Self-pay

## 2021-05-15 MED ORDER — MYCOPHENOLATE MOFETIL 500 MG PO TABS: 750.0000 mg | ORAL_TABLET | Freq: Two times a day (BID) | ORAL | 0 refills | Status: DC

## 2021-05-17 ENCOUNTER — Ambulatory Visit: Payer: Medicare Other | Admitting: Neurology

## 2021-05-17 ENCOUNTER — Ambulatory Visit: Payer: Medicare Other | Admitting: Family Medicine

## 2021-05-17 ENCOUNTER — Other Ambulatory Visit: Payer: Self-pay | Admitting: Family Medicine

## 2021-05-17 ENCOUNTER — Encounter: Payer: Self-pay | Admitting: Neurology

## 2021-05-17 ENCOUNTER — Other Ambulatory Visit: Payer: Self-pay

## 2021-05-17 VITALS — BP 122/70 | HR 57 | Ht 70.0 in | Wt 249.0 lb

## 2021-05-17 DIAGNOSIS — G7000 Myasthenia gravis without (acute) exacerbation: Secondary | ICD-10-CM

## 2021-05-17 DIAGNOSIS — Z5181 Encounter for therapeutic drug level monitoring: Secondary | ICD-10-CM

## 2021-05-17 NOTE — Progress Notes (Signed)
Follow-up Visit   Date: 05/18/2219    [name] MRN: [id number] DOB: 01-15-1951   Interim History: Rodney Martinez is a 70 y.o. right-handed Caucasian male with OSA, hypothyroidism, and diabetes mellitus returning to the clinic for follow-up of seropositive myasthenia gravis.  The patient was accompanied to the clinic by self.  History of present illness: Starting in mid January 2018, he developed slurred speech, difficulty swallowing, droopy eyelids, and weakness with moving his tongue. MRI brain was negative.  There was high clinical suspicion for myasthenia so he was started on mestinon '30mg'$  three times daily; in the interim, AChR antibodies returned positive.  He saw my colleague, Dr. Tomi Martinez initially who started him on prednisone '20mg'$  and increased mestinon to '60mg'$  three times daily and is here to establish care with me. Due to lack of improvement, his prednisone was titrated to '60mg'$  daily and IVIG was started in March 2018. Within a month, his dysphagia and double vision improved.  In May, slow prednisone taper was started.  He was noted to have low platelets (127) and referred to hematology for evaluation, especially since he was also started on Cellcept.  By September he was down to prednisone '20mg'$  and getting IVIG ever 3 weeks. Over 2019-2020, his IVIG has been tapered to every 8 weeks and eventually stopped in May 2020.  His Cellcept was increased to '750mg'$  BID in March 2020.  He continues a slow prednisone taper.   UPDATE 02/08/2021:  Here is here for follow-up visit.  He has being doing great on prednisone '5mg'$  daily and denies any breakthrough weakness.  He remains on Cellcept '750mg'$  BID and mestinon '60mg'$  BID (9a and 5p).  No problems with speech/swallowing, double vision, or limb weakness.   UPDATE 05/17/2021:  He has been tolerating prednisone taper.  He has been on prednisone 2.'5mg'$  for about a month.  He has had two spells of double vision when he was looking at an airplane and  another time when looking at a treeline.  No problems with droopy eyelids, difficulty swallowing/talking, or arm or leg weakness.   Medications:  Current Outpatient Medications on File Prior to Visit  Medication Sig Dispense Refill   Apoaequorin (PREVAGEN) 10 MG CAPS Take by mouth.     Calcium-Magnesium-Vitamin D (CALCIUM 500 PO) Take 1 tablet by mouth at bedtime.      Emollient (DERMEND BRUISE FORMULA EX) Apply topically 2 (two) times daily as needed.     glucose blood (ACCU-CHEK GUIDE) test strip Test once daily.  Dx e11.9 100 each 3   JARDIANCE 10 MG TABS tablet Take 1 tablet by mouth once daily 90 tablet 0   Melatonin 10 MG TABS Take 5 mg by mouth at bedtime.     metFORMIN (GLUCOPHAGE XR) 750 MG 24 hr tablet Take 1 tablet (750 mg total) by mouth in the morning and at bedtime. 180 tablet 3   mycophenolate (CELLCEPT) 500 MG tablet Take 1.5 tablets (750 mg total) by mouth 2 (two) times daily. 270 tablet 0   omeprazole (PRILOSEC) 20 MG capsule Take 20 mg by mouth daily.     predniSONE (DELTASONE) 2.5 MG tablet Take 2.5 mg by mouth daily with breakfast.     pyridostigmine (MESTINON) 60 MG tablet Take 1 tablet (60 mg total) by mouth 2 (two) times daily. Refill when pt requests. 180 tablet 3   rosuvastatin (CRESTOR) 10 MG tablet Take 1 tablet (10 mg total) by mouth daily. 90 tablet 3  tamsulosin (FLOMAX) 0.4 MG CAPS capsule Take 1 capsule (0.4 mg total) by mouth daily. 90 capsule 0   vitamin B-12 (CYANOCOBALAMIN) 500 MCG tablet Take 500 mcg by mouth daily.     predniSONE (DELTASONE) 5 MG tablet Take 1 tablet (5 mg total) by mouth daily with breakfast. (Patient not taking: Reported on 05/17/2021) 150 tablet 3   No current facility-administered medications on file prior to visit.    Allergies: No Known Allergies  Vital Signs:  BP 122/70   Pulse (!) 57   Ht '5\' 10"'$  (1.778 m)   Wt 249 lb (112.9 kg)   SpO2 97%   BMI 35.73 kg/m     Neurological Exam: MENTAL STATUS including orientation to  time, place, person, recent and remote memory, attention span and concentration, language, and fund of knowledge is normal.  Speech is normal, no dysarthria.    CRANIAL NERVES: Normal conjugate, extra-ocular eye movements in all directions of gaze. Mild left ptosis at rest, no worsening with sustained upgaze.  Face is symmetric.  Facial muscles are 5/5 throughout - Orbicularis oris and oculi is 5/5; buccinator 5/5.  Tongue strength is 5/5.    MOTOR:  Motor strength is 5/5 throughout.   COORDINATION/GAIT:  He is able to stand up without pushing off.   Gait narrow based and stable.  Data: AChR binding 1.01*, blocking 61*, modulating 25*  MRI brain wwo contrast 10/27/2017:  1. No acute intracranial process or abnormal enhancement. 2. Mild parenchymal brain volume loss for age.  CT chest w contrast 02/03/2018: No acute chest abnormality. Specifically, no evidence for a mediastinal mass or abnormal thymic tissue. Probable scarring in the left lower lung. Indeterminate 4 mm nodular density in the right middle lobe. No follow-up needed if patient is low-risk. Non-contrast chest CT can be considered in 12 months if patient is high-risk.   Lab Results  Component Value Date   WBC 8.1 05/14/2020   HGB 14.3 05/14/2020   HCT 42.6 05/14/2020   MCV 95.6 05/14/2020   PLT 153.0 05/14/2020   Lab Results  Component Value Date   CREATININE 0.99 05/14/2020   BUN 17 05/14/2020   NA 141 05/14/2020   K 4.6 05/14/2020   CL 108 05/14/2020   CO2 22 05/14/2020   Lab Results  Component Value Date   HGBA1C 6.8 (A) 01/22/2021   Lab Results  Component Value Date   VITAMINB12 498 07/08/2019     IMPRESSION/PLAN: 1.  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  Minimal disease manifestation with mild left ptosis, no other weakness on exam.  Diagnosed 11/2017 with severe initial presentation requiring IVIG (long taper, dc'd 02/2019) and prednisone '60mg'$ .  Cellcept added in 01/2018 and increased  to '750mg'$  BID in 12/2018.    - Continue prednisone 2.'5mg'$  daily  - Continue Cellcept '750mg'$  twice daily, may need to titrate this further, if he continues to have intermittent symptoms  - Continue mestinon '60mg'$  twice daily 9a and 5pm  - Check CBC and CMP  2.  Long-term corticosteroid use  -Continue calcium and vitamin D  -Continue Pepcid for GI prophylaxis  -Diabetes followed by PCP and doing well  Return to clinic in 3 months  Thank you for allowing me to participate in patient's care.  If I can answer any additional questions, I would be pleased to do so.    Sincerely,    Halston Fairclough K. Posey Pronto, DO

## 2021-05-17 NOTE — Patient Instructions (Addendum)
Stay on prednisone 2.'5mg'$  daily Continue Cellcept '750mg'$  twice daily and mestinon '60mg'$  twice daily. Check labs Return to clinic in 3 months

## 2021-05-17 NOTE — Telephone Encounter (Signed)
Please advise. Rx was discontinued by another CMA. Is this something the patient should continue?

## 2021-05-20 ENCOUNTER — Other Ambulatory Visit: Payer: Self-pay

## 2021-05-20 ENCOUNTER — Other Ambulatory Visit (INDEPENDENT_AMBULATORY_CARE_PROVIDER_SITE_OTHER): Payer: Medicare Other

## 2021-05-20 ENCOUNTER — Telehealth: Payer: Self-pay

## 2021-05-20 DIAGNOSIS — G7000 Myasthenia gravis without (acute) exacerbation: Secondary | ICD-10-CM | POA: Diagnosis not present

## 2021-05-20 LAB — COMPREHENSIVE METABOLIC PANEL
ALT: 24 U/L (ref 0–53)
AST: 17 U/L (ref 0–37)
Albumin: 4.6 g/dL (ref 3.5–5.2)
Alkaline Phosphatase: 48 U/L (ref 39–117)
BUN: 27 mg/dL — ABNORMAL HIGH (ref 6–23)
CO2: 23 mEq/L (ref 19–32)
Calcium: 9.6 mg/dL (ref 8.4–10.5)
Chloride: 106 mEq/L (ref 96–112)
Creatinine, Ser: 1.13 mg/dL (ref 0.40–1.50)
GFR: 64.88 mL/min (ref >60.00)
Glucose, Bld: 158 mg/dL — ABNORMAL HIGH (ref 70–99)
Potassium: 4.5 mEq/L (ref 3.5–5.1)
Sodium: 140 mEq/L (ref 135–145)
Total Bilirubin: 0.3 mg/dL (ref 0.2–1.2)
Total Protein: 6.8 g/dL (ref 6.0–8.3)

## 2021-05-20 LAB — CBC
HCT: 44.6 % (ref 39.0–52.0)
Hemoglobin: 14.8 g/dL (ref 13.0–17.0)
MCHC: 33.1 g/dL (ref 30.0–36.0)
MCV: 94.2 fl (ref 78.0–100.0)
Platelets: 163 10*3/uL (ref 150.0–400.0)
RBC: 4.73 Mil/uL (ref 4.22–5.81)
RDW: 12.9 % (ref 11.5–15.5)
WBC: 7.7 10*3/uL (ref 4.0–10.5)

## 2021-05-20 NOTE — Telephone Encounter (Signed)
Decision to be made in 3 days    Stann Ore III KeyEnid Skeens - Rx #: [id number] Need help? Call us at [phone] Status Sent to [facility] Mycophenolate Mofetil '500MG'$  tablets Form [employer] Part D Electronic Request Form (CB) Original Claim Info [phone]

## 2021-06-11 ENCOUNTER — Encounter: Payer: Self-pay | Admitting: Family Medicine

## 2021-06-11 ENCOUNTER — Encounter: Payer: Self-pay | Admitting: Dermatology

## 2021-06-14 ENCOUNTER — Ambulatory Visit: Payer: Medicare Other | Admitting: Neurology

## 2021-06-15 ENCOUNTER — Other Ambulatory Visit: Payer: Self-pay | Admitting: Family Medicine

## 2021-06-17 ENCOUNTER — Encounter: Payer: Medicare Other | Admitting: Dermatology

## 2021-06-17 NOTE — Telephone Encounter (Signed)
Please advise. Rx is not on the current med list 

## 2021-06-25 ENCOUNTER — Other Ambulatory Visit: Payer: Self-pay | Admitting: Family Medicine

## 2021-06-27 NOTE — Telephone Encounter (Signed)
Done

## 2021-07-08 DIAGNOSIS — G4733 Obstructive sleep apnea (adult) (pediatric): Secondary | ICD-10-CM | POA: Diagnosis not present

## 2021-07-15 ENCOUNTER — Other Ambulatory Visit: Payer: Self-pay | Admitting: Family Medicine

## 2021-07-31 ENCOUNTER — Encounter: Payer: Medicare Other | Admitting: Dermatology

## 2021-08-08 DIAGNOSIS — G4733 Obstructive sleep apnea (adult) (pediatric): Secondary | ICD-10-CM | POA: Diagnosis not present

## 2021-08-19 ENCOUNTER — Encounter: Payer: Self-pay | Admitting: Neurology

## 2021-08-19 ENCOUNTER — Ambulatory Visit: Payer: Medicare Other | Admitting: Neurology

## 2021-08-19 ENCOUNTER — Other Ambulatory Visit: Payer: Self-pay

## 2021-08-19 VITALS — BP 127/74 | HR 62 | Resp 18 | Wt 257.0 lb

## 2021-08-19 DIAGNOSIS — G7000 Myasthenia gravis without (acute) exacerbation: Secondary | ICD-10-CM | POA: Diagnosis not present

## 2021-08-19 NOTE — Progress Notes (Signed)
Follow-up Visit   Date: 08/20/2219    Rodney Martinez MRN: [id number] DOB: June 23, 1951   Interim History: Rodney Martinez is a 70 y.o. right-handed Caucasian male with OSA, hypothyroidism, and diabetes mellitus returning to the clinic for follow-up of seropositive myasthenia gravis.  The patient was accompanied to the clinic by self.  History of present illness: Starting in mid January 2018, he developed slurred speech, difficulty swallowing, droopy eyelids, and weakness with moving his tongue. MRI brain was negative.  There was high clinical suspicion for myasthenia so he was started on mestinon 30mg  three times daily; in the interim, AChR antibodies returned positive.  He saw my colleague, Dr. Tomi Likens initially who started him on prednisone 20mg  and increased mestinon to 60mg  three times daily and is here to establish care with me. Due to lack of improvement, his prednisone was titrated to 60mg  daily and IVIG was started in March 2018. Within a month, his dysphagia and double vision improved.  In May, slow prednisone taper was started.  He was noted to have low platelets (127) and referred to hematology for evaluation, especially since he was also started on Cellcept.  By September he was down to prednisone 20mg  and getting IVIG ever 3 weeks. Over 2019-2020, his IVIG has been tapered to every 8 weeks and eventually stopped in May 2020.  His Cellcept was increased to 750mg  BID in March 2020.  He continues a slow prednisone taper.   UPDATE 02/08/2021:  Here is here for follow-up visit.  He has being doing great on prednisone 5mg  daily and denies any breakthrough weakness.  He remains on Cellcept 750mg  BID and mestinon 60mg  BID (9a and 5p).  No problems with speech/swallowing, double vision, or limb weakness.   UPDATE 05/17/2021:  He has been tolerating prednisone taper.  He has been on prednisone 2.5mg  for about a month.  He has had two spells of double vision when he was looking at an airplane and  another time when looking at a tre[place].  No problems with droopy eyelids, difficulty swallowing/talking, or arm or leg weakness.   UPDATE 08/19/2021: He is here for follow-up visit.  He has been on prednisone 2.5mg  and denies any weakness, double vision, or difficulty with speech/swallow.  No new complaints.  Medications:  Current Outpatient Medications on File Prior to Visit  Medication Sig Dispense Refill   Apoaequorin (PREVAGEN) 10 MG CAPS Take by mouth.     Calcium-Magnesium-Vitamin D (CALCIUM 500 PO) Take 1 tablet by mouth at bedtime.      Emollient (DERMEND BRUISE FORMULA EX) Apply topically 2 (two) times daily as needed.     glucose blood (ACCU-CHEK GUIDE) test strip Test once daily.  Dx e11.9 100 each 3   JARDIANCE 10 MG TABS tablet TAKE 1 TABLET BY MOUTH ONCE DAILY . APPOINTMENT REQUIRED FOR FUTURE REFILLS 90 tablet 0   levothyroxine (SYNTHROID) 25 MCG tablet TAKE 1 TABLET BY MOUTH ONCE DAILY BEFORE BREAKFAST . APPOINTMENT REQUIRED FOR FUTURE REFILLS 90 tablet 0   Melatonin 10 MG TABS Take 5 mg by mouth at bedtime.     metFORMIN (GLUCOPHAGE XR) 750 MG 24 hr tablet Take 1 tablet (750 mg total) by mouth in the morning and at bedtime. 180 tablet 3   mycophenolate (CELLCEPT) 500 MG tablet Take 1.5 tablets (750 mg total) by mouth 2 (two) times daily. 270 tablet 0   omeprazole (PRILOSEC) 20 MG capsule Take 20 mg by mouth daily.     predniSONE (  DELTASONE) 2.5 MG tablet Take 2.5 mg by mouth daily with breakfast.     pyridostigmine (MESTINON) 60 MG tablet Take 1 tablet (60 mg total) by mouth 2 (two) times daily. Refill when pt requests. 180 tablet 3   rosuvastatin (CRESTOR) 10 MG tablet Take 1 tablet by mouth once daily 90 tablet 0   tamsulosin (FLOMAX) 0.4 MG CAPS capsule Take 1 capsule (0.4 mg total) by mouth daily. 90 capsule 0   vitamin B-12 (CYANOCOBALAMIN) 500 MCG tablet Take 500 mcg by mouth daily.     predniSONE (DELTASONE) 5 MG tablet Take 1 tablet (5 mg total) by mouth daily with  breakfast. (Patient not taking: No sig reported) 150 tablet 3   No current facility-administered medications on file prior to visit.    Allergies: No Known Allergies  Vital Signs:  BP 127/74   Pulse 62   Resp 18   Wt 257 lb (116.6 kg)   SpO2 99%   BMI 36.88 kg/m     Neurological Exam: MENTAL STATUS including orientation to time, place, person, recent and remote memory, attention span and concentration, language, and fund of knowledge is normal.  Speech is normal, no dysarthria.    CRANIAL NERVES: Normal conjugate, extra-ocular eye movements in all directions of gaze. Trace left ptosis at rest, no worsening with sustained upgaze.  Face is symmetric.  Facial muscles are 5/5 throughout - Orbicularis oris and oculi is 5/5; buccinator 5/5.  Tongue strength is 5/5.    MOTOR:  Motor strength is 5/5 throughout.   COORDINATION/GAIT:  He is able to stand up without pushing off.   Gait narrow based and stable.  Data: AChR binding 1.01*, blocking 61*, modulating 25*  MRI brain wwo contrast 10/27/2017:  1. No acute intracranial process or abnormal enhancement. 2. Mild parenchymal brain volume loss for age.  CT chest w contrast 02/03/2018: No acute chest abnormality. Specifically, no evidence for a mediastinal mass or abnormal thymic tissue. Probable scarring in the left lower lung. Indeterminate 4 mm nodular density in the right middle lobe. No follow-up needed if patient is low-risk. Non-contrast chest CT can be considered in 12 months if patient is high-risk.   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 Minimal disease manifestation with trace left ptosis, no other bulbar or limb weakness. Diagnosed 11/2017 with severe initial presentation requiring IVIG (long taper, dc'd 02/2019) and prednisone 60mg .  Cellcept added in 01/2018 and increased to 750mg  BID in 12/2018.    - Continue Cellcept 750mg  BID, can titrate further as needed  - Continue mestinon 60mg  BID at 9a and 5p  - Taper prednisone to 2.5mg  MWF x 2 weeks, then MTh x 2 weeks, then once weekly x 2 weeks, then stop  - Check CBC and CMP at next visit  2.  Long-term corticosteroid use  -Continue calcium and vitamin D  -Continue Pepcid for GI prophylaxis  -Diabetes followed  by PCP and doing well  Return to clinic in 3 months  Thank you for allowing me to participate in patient's care.  If I can answer any additional questions, I would be pleased to do so.    Sincerely,    Erique Kaser K. Posey Pronto, DO

## 2021-08-19 NOTE — Patient Instructions (Addendum)
Reduce prednisone 2.5mg   Monday, Wednesday, Friday x 2 weeks, then Monday Thursday x 2 weeks, then Monday x 2 weeks, then stop.  If you develop any weakness, please contact my office  Return to clinic in 3 months

## 2021-08-23 ENCOUNTER — Other Ambulatory Visit: Payer: Self-pay | Admitting: Family Medicine

## 2021-09-07 ENCOUNTER — Other Ambulatory Visit: Payer: Self-pay | Admitting: Family Medicine

## 2021-09-07 ENCOUNTER — Other Ambulatory Visit: Payer: Self-pay | Admitting: Neurology

## 2021-09-07 DIAGNOSIS — G4733 Obstructive sleep apnea (adult) (pediatric): Secondary | ICD-10-CM | POA: Diagnosis not present

## 2021-10-02 ENCOUNTER — Other Ambulatory Visit: Payer: Self-pay | Admitting: Family Medicine

## 2021-10-14 ENCOUNTER — Other Ambulatory Visit: Payer: Self-pay | Admitting: Family Medicine

## 2021-10-16 ENCOUNTER — Other Ambulatory Visit: Payer: Self-pay

## 2021-10-16 ENCOUNTER — Encounter: Payer: Self-pay | Admitting: Dermatology

## 2021-10-16 ENCOUNTER — Ambulatory Visit: Payer: Medicare Other | Admitting: Dermatology

## 2021-10-16 DIAGNOSIS — Z1283 Encounter for screening for malignant neoplasm of skin: Secondary | ICD-10-CM

## 2021-10-16 DIAGNOSIS — C44229 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: Secondary | ICD-10-CM

## 2021-10-16 DIAGNOSIS — L720 Epidermal cyst: Secondary | ICD-10-CM | POA: Diagnosis not present

## 2021-10-16 DIAGNOSIS — Z85828 Personal history of other malignant neoplasm of skin: Secondary | ICD-10-CM

## 2021-10-16 DIAGNOSIS — L570 Actinic keratosis: Secondary | ICD-10-CM | POA: Diagnosis not present

## 2021-10-16 DIAGNOSIS — D1800 Hemangioma unspecified site: Secondary | ICD-10-CM | POA: Diagnosis not present

## 2021-10-16 DIAGNOSIS — D485 Neoplasm of uncertain behavior of skin: Secondary | ICD-10-CM

## 2021-10-16 DIAGNOSIS — I872 Venous insufficiency (chronic) (peripheral): Secondary | ICD-10-CM

## 2021-10-29 ENCOUNTER — Ambulatory Visit: Payer: Medicare Other | Admitting: Dermatology

## 2021-11-09 ENCOUNTER — Encounter: Payer: Self-pay | Admitting: Dermatology

## 2021-11-09 NOTE — Progress Notes (Signed)
Follow-Up Visit   Subjective  Rodney Martinez is a 71 y.o. male who presents for the following: Annual Exam (Lesion on left ear x months. Lesion on left should x years. History of scc.).  Annual skin examination, enlarging crust on left ear and several other spots of concern Location:  Duration:  Quality:  Associated Signs/Symptoms: Modifying Factors:  Severity:  Timing: Context:   Objective  Well appearing patient in no apparent distress; mood and affect are within normal limits. Left Buccal Cheek, Left Forehead (2), Right Dorsal Hand Hornlike pink 2 to 3 mm crusts  Left Abdomen (side) - Upper 2 mm smooth red dermal papule  Right Upper Back 9 mm deep dermal noninflamed papule  Right Upper Back Waist up skin examination: No atypical pigmented lesions.  1 probable nonmelanoma skin cancer ear will be biopsied and treated  Left Lower Leg - Anterior Edema with hemosiderosis and currently no active dermatitis  Left Superior Helix 6 mm hornlike crust on waxy pink base, probable SCCA         A full examination was performed including scalp, head, eyes, ears, nose, lips, neck, chest, axillae, abdomen, back, buttocks, bilateral upper extremities, bilateral lower extremities, hands, feet, fingers, toes, fingernails, and toenails. All findings within normal limits unless otherwise noted below.  Area beneath undergarment not fully examined   Assessment & Plan    AK (actinic keratosis) (4) Right Dorsal Hand; Left Buccal Cheek; Left Forehead (2)  Destruction of lesion - Left Buccal Cheek, Left Forehead, Right Dorsal Hand Complexity: simple   Destruction method: cryotherapy   Informed consent: discussed and consent obtained   Timeout:  patient name, date of birth, surgical site, and procedure verified Lesion destroyed using liquid nitrogen: Yes   Cryotherapy cycles:  3 Outcome: patient tolerated procedure well with no complications    Hemangioma, unspecified site Left  Abdomen (side) - Upper  No intervention necessary  Epidermal cyst Right Upper Back  May leave if clinically stable  Encounter for screening for malignant neoplasm of skin Right Upper Back  Annual skin examination  Venous stasis dermatitis of left lower extremity Left Lower Leg - Anterior  Gust ways to minimize venous pressure. Call if he develops a rash or itching.  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 Left Superior Helix  Skin / nail biopsy Type of biopsy: tangential   Informed consent: discussed and consent obtained   Timeout: patient name, date of birth, surgical site, and procedure verified   Procedure prep:  Patient was prepped and draped in usual sterile fashion (Non sterile) Prep type:  Chlorhexidine Anesthesia: the lesion was anesthetized in a standard fashion   Anesthetic:  1% lidocaine w/ epinephrine 1-100,000 local infiltration Instrument used: flexible razor blade   Outcome: patient tolerated procedure well   Post-procedure details: wound care instructions given    Destruction of lesion Complexity: simple   Destruction method: electrodesiccation and curettage   Informed consent: discussed and consent obtained   Timeout:  patient name, date of birth, surgical site, and procedure verified Anesthesia: the lesion was anesthetized in a standard fashion   Anesthetic:  1% lidocaine w/ epinephrine 1-100,000 local infiltration Curettage performed in three different directions: Yes   Electrodesiccation performed over the curetted area: Yes   Curettage cycles:  1 Lesion length (cm):  0.6 Lesion width (cm):  0.6 Margin per side (cm):  0 Final wound size (cm):  0.6 Hemostasis achieved with:  aluminum chloride Outcome: patient  tolerated procedure well with no complications   Post-procedure details: wound care instructions given    Specimen 1 - Surgical pathology Differential Diagnosis: bcc vs [id number]  Check Margins:  No  After shave biopsy the base was treated with curettage plus cautery      I, Lavonna Monarch, MD, have reviewed all documentation for this visit.  The documentation on 11/09/21 for the exam, diagnosis, procedures, and orders are all accurate and complete.

## 2021-11-18 ENCOUNTER — Encounter: Payer: Self-pay | Admitting: Neurology

## 2021-12-02 ENCOUNTER — Ambulatory Visit: Payer: Medicare Other | Admitting: Neurology

## 2021-12-05 DIAGNOSIS — M5411 Radiculopathy, occipito-atlanto-axial region: Secondary | ICD-10-CM | POA: Diagnosis not present

## 2021-12-05 DIAGNOSIS — M9901 Segmental and somatic dysfunction of cervical region: Secondary | ICD-10-CM | POA: Diagnosis not present

## 2021-12-09 DIAGNOSIS — M5411 Radiculopathy, occipito-atlanto-axial region: Secondary | ICD-10-CM | POA: Diagnosis not present

## 2021-12-09 DIAGNOSIS — M9901 Segmental and somatic dysfunction of cervical region: Secondary | ICD-10-CM | POA: Diagnosis not present

## 2021-12-11 ENCOUNTER — Other Ambulatory Visit: Payer: Self-pay | Admitting: Family Medicine

## 2021-12-12 DIAGNOSIS — M9901 Segmental and somatic dysfunction of cervical region: Secondary | ICD-10-CM | POA: Diagnosis not present

## 2021-12-12 DIAGNOSIS — M5411 Radiculopathy, occipito-atlanto-axial region: Secondary | ICD-10-CM | POA: Diagnosis not present

## 2021-12-14 ENCOUNTER — Other Ambulatory Visit: Payer: Self-pay | Admitting: Family Medicine

## 2021-12-14 ENCOUNTER — Other Ambulatory Visit: Payer: Self-pay | Admitting: Neurology

## 2022-01-01 DIAGNOSIS — M9901 Segmental and somatic dysfunction of cervical region: Secondary | ICD-10-CM | POA: Diagnosis not present

## 2022-01-01 DIAGNOSIS — M5411 Radiculopathy, occipito-atlanto-axial region: Secondary | ICD-10-CM | POA: Diagnosis not present

## 2022-01-06 ENCOUNTER — Other Ambulatory Visit: Payer: Self-pay | Admitting: Family Medicine

## 2022-01-07 DIAGNOSIS — M9901 Segmental and somatic dysfunction of cervical region: Secondary | ICD-10-CM | POA: Diagnosis not present

## 2022-01-07 DIAGNOSIS — M5411 Radiculopathy, occipito-atlanto-axial region: Secondary | ICD-10-CM | POA: Diagnosis not present

## 2022-01-08 ENCOUNTER — Ambulatory Visit (INDEPENDENT_AMBULATORY_CARE_PROVIDER_SITE_OTHER): Payer: Medicare Other

## 2022-01-08 ENCOUNTER — Ambulatory Visit (INDEPENDENT_AMBULATORY_CARE_PROVIDER_SITE_OTHER): Payer: Medicare Other | Admitting: Family Medicine

## 2022-01-08 ENCOUNTER — Encounter: Payer: Self-pay | Admitting: Family Medicine

## 2022-01-08 VITALS — BP 120/68 | HR 65 | Temp 97.6°F | Ht 70.0 in | Wt 251.4 lb

## 2022-01-08 DIAGNOSIS — M160 Bilateral primary osteoarthritis of hip: Secondary | ICD-10-CM | POA: Diagnosis not present

## 2022-01-08 DIAGNOSIS — M25552 Pain in left hip: Secondary | ICD-10-CM

## 2022-01-08 DIAGNOSIS — R5383 Other fatigue: Secondary | ICD-10-CM

## 2022-01-08 DIAGNOSIS — M25551 Pain in right hip: Secondary | ICD-10-CM

## 2022-01-08 DIAGNOSIS — R102 Pelvic and perineal pain: Secondary | ICD-10-CM | POA: Diagnosis not present

## 2022-01-08 DIAGNOSIS — M25511 Pain in right shoulder: Secondary | ICD-10-CM

## 2022-01-08 DIAGNOSIS — E039 Hypothyroidism, unspecified: Secondary | ICD-10-CM | POA: Diagnosis not present

## 2022-01-08 DIAGNOSIS — E785 Hyperlipidemia, unspecified: Secondary | ICD-10-CM | POA: Diagnosis not present

## 2022-01-08 DIAGNOSIS — E1165 Type 2 diabetes mellitus with hyperglycemia: Secondary | ICD-10-CM

## 2022-01-08 LAB — COMPREHENSIVE METABOLIC PANEL
ALT: 30 U/L (ref 0–53)
AST: 21 U/L (ref 0–37)
Albumin: 4.8 g/dL (ref 3.5–5.2)
Alkaline Phosphatase: 45 U/L (ref 39–117)
BUN: 20 mg/dL (ref 6–23)
CO2: 25 mEq/L (ref 19–32)
Calcium: 9.2 mg/dL (ref 8.4–10.5)
Chloride: 106 mEq/L (ref 96–112)
Creatinine, Ser: 0.94 mg/dL (ref 0.40–1.50)
GFR: 80.56 mL/min (ref >60.00)
Glucose, Bld: 166 mg/dL — ABNORMAL HIGH (ref 70–99)
Potassium: 4.7 mEq/L (ref 3.5–5.1)
Sodium: 139 mEq/L (ref 135–145)
Total Bilirubin: 0.5 mg/dL (ref 0.2–1.2)
Total Protein: 6.9 g/dL (ref 6.0–8.3)

## 2022-01-08 LAB — CBC WITH DIFFERENTIAL/PLATELET
Basophils Absolute: 0 10*3/uL (ref 0.0–0.1)
Basophils Relative: 0.3 % (ref 0.0–3.0)
Eosinophils Absolute: 0.2 10*3/uL (ref 0.0–0.7)
Eosinophils Relative: 2.2 % (ref 0.0–5.0)
HCT: 45.2 % (ref 39.0–52.0)
Hemoglobin: 15 g/dL (ref 13.0–17.0)
Lymphocytes Relative: 40.5 % (ref 12.0–46.0)
Lymphs Abs: 3 10*3/uL (ref 0.7–4.0)
MCHC: 33.3 g/dL (ref 30.0–36.0)
MCV: 93.5 fl (ref 78.0–100.0)
Monocytes Absolute: 0.5 10*3/uL (ref 0.1–1.0)
Monocytes Relative: 6.4 % (ref 3.0–12.0)
Neutro Abs: 3.7 10*3/uL (ref 1.4–7.7)
Neutrophils Relative %: 50.6 % (ref 43.0–77.0)
Platelets: 167 10*3/uL (ref 150.0–400.0)
RBC: 4.83 Mil/uL (ref 4.22–5.81)
RDW: 13.5 % (ref 11.5–15.5)
WBC: 7.4 10*3/uL (ref 4.0–10.5)

## 2022-01-08 LAB — LIPID PANEL
Cholesterol: 120 mg/dL (ref 0–200)
HDL: 45.5 mg/dL (ref >39.00)
LDL Cholesterol: 48 mg/dL (ref 0–99)
NonHDL: 74.92
Total CHOL/HDL Ratio: 3
Triglycerides: 137 mg/dL (ref 0.0–149.0)
VLDL: 27.4 mg/dL (ref 0.0–40.0)

## 2022-01-08 LAB — TSH: TSH: 5.4 u[IU]/mL (ref 0.35–5.50)

## 2022-01-08 LAB — HEMOGLOBIN A1C: Hgb A1c MFr Bld: 7 % — ABNORMAL HIGH (ref 4.6–6.5)

## 2022-01-08 NOTE — Progress Notes (Signed)
? ?Established Patient Office Visit ? ?Subjective:  ?Patient ID: Rodney Martinez, male    DOB: Feb 28, 1951  Age: 71 y.o. MRN: [id number] ? ?CC:  ?Chief Complaint  ?Patient presents with  ? Joint Pain  ?  Patient complains of joint pain n shoulder and hips, x2 months   ? ? ?HPI ?Rodney Martinez has history of chronic problems including obstructive sleep apnea, type 2 diabetes, hypothyroidism, myasthenia gravis, BPH, dyslipidemia.  Seen today for the following issues ? ?Type 2 diabetes.  Currently treated with Jardiance and metformin.  Needs follow-up A1c.  Not monitoring blood sugars regularly. ? ?He has hyperlipidemia treated with rosuvastatin.  No significant myalgias.  He has hypothyroidism treated with low-dose levothyroxine 25 mcg daily.  He is complaining of some general fatigue issues.  Poor sleep quality.  Frequently takes 2-hour naps around 6 PM. ? ?Multiple arthralgias including shoulders right greater than left.  He had some bilateral hip pain.  No fall or injury.  As seen chiropractor and they have been working on range of motion right shoulder.  He has pain with abduction.  Progressive pain at night especially with rolling onto the right shoulder. ? ?Past Medical History:  ?Diagnosis Date  ? Cataract 2020, 2021  ? Both eyes  ? Diabetes mellitus without complication ([place])   ? GERD 12/12/2009  ? HYPERGLYCEMIA 12/12/2009  ? Myasthenia gravis ([place])   ? OBSTRUCTIVE SLEEP APNEA 08/14/2010  ? 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 left upper back - tx p bx  ? 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 left upper outer eyelid - CX3 + 5FU  ? Sleep apnea   ? uses CPAP   ? Thyroid disease   ? TRANSAMINASES, SERUM, ELEVATED 12/12/2009  ? ? ?Past Surgical History:  ?Procedure Laterality Date  ? BASAL CELL CARCINOMA EXCISION  2007  ? face (Mohs)  ? CATARACT EXTRACTION Right 08/2019  ? COLONOSCOPY  04/15/2012  ? HERNIA REPAIR  [id number]  ? umbilical  ? POLYPECTOMY    ? ? ?Family History  ?Problem Relation Age of Onset  ?  Diabetes Mother   ?     type ll  ? Heart disease Father   ?     ?atrial fibrillation  ? Diabetes Maternal Grandfather   ? Diabetes Paternal Grandmother   ? Diabetes Paternal Grandfather   ? Healthy Sister   ? Diabetes Brother   ? Healthy Daughter   ? Colon cancer Neg Hx   ? Stomach cancer Neg Hx   ? Colon polyps Neg Hx   ? Esophageal cancer Neg Hx   ? Rectal cancer Neg Hx   ? ? ?Social History  ? ?Socioeconomic History  ? Marital status: Married  ?  Spouse name: Not on file  ? Number of children: 1  ? Years of education: vet school  ? Highest education level: Professional school degree (e.g., MD, DDS, DVM, JD)  ?Occupational History  ? Occupation: retired Animal nutritionist  ?Tobacco Use  ? Smoking status: Every Day  ?  Types: Cigars  ? Smokeless tobacco: Never  ? Tobacco comments:  ?  Pt states that he smokes 6 cigars a day, he smoked cigarettes for 10 years.  ?Vaping Use  ? Vaping Use: Never used  ?Substance and Sexual Activity  ? Alcohol use: Not Currently  ?  Comment: rare  ? Drug use: No  ? Sexual activity: Not on file  ?Other Topics Concern  ? Not on file  ?Social History  Narrative  ? Lives with wife in a 2 story home.  Has one child. Retired Animal nutritionist.    ? Right handed   ? ?Social Determinants of Health  ? ?Financial Resource Strain: Low Risk   ? Difficulty of Paying Living Expenses: Not hard at all  ?Food Insecurity: No Food Insecurity  ? Worried About Charity fundraiser in the Last Year: Never true  ? Ran Out of Food in the Last Year: Never true  ?Transportation Needs: No Transportation Needs  ? Lack of Transportation (Medical): No  ? Lack of Transportation (Non-Medical): No  ?Physical Activity: Inactive  ? Days of Exercise per Week: 0 days  ? Minutes of Exercise per Session: 0 min  ?Stress: No Stress Concern Present  ? Feeling of Stress : Not at all  ?Social Connections: Moderately Isolated  ? Frequency of Communication with Friends and Family: Three times a week  ? Frequency of Social Gatherings with  Friends and Family: Three times a week  ? Attends Religious Services: Never  ? Active Member of Clubs or Organizations: No  ? Attends Archivist Meetings: Never  ? Marital Status: Married  ?Intimate Partner Violence: Not At Risk  ? Fear of Current or Ex-Partner: No  ? Emotionally Abused: No  ? Physically Abused: No  ? Sexually Abused: No  ? ? ?Outpatient Medications Prior to Visit  ?Medication Sig Dispense Refill  ? Calcium-Magnesium-Vitamin D (CALCIUM 500 PO) Take 1 tablet by mouth at bedtime.     ? Emollient (DERMEND BRUISE FORMULA EX) Apply topically 2 (two) times daily as needed.    ? glucose blood (ACCU-CHEK GUIDE) test strip Test once daily.  Dx e11.9 100 each 3  ? JARDIANCE 10 MG TABS tablet TAKE 1 TABLET BY MOUTH ONCE DAILY . APPOINTMENT REQUIRED FOR FUTURE REFILLS 90 tablet 0  ? levothyroxine (SYNTHROID) 25 MCG tablet TAKE 1 TABLET BY MOUTH ONCE DAILY BEFORE BREAKFAST . APPOINTMENT REQUIRED FOR FUTURE REFILLS 90 tablet 0  ? Melatonin 10 MG TABS Take 5 mg by mouth at bedtime.    ? metFORMIN (GLUCOPHAGE-XR) 750 MG 24 hr tablet TAKE 1 TABLET BY MOUTH ONCE DAILY IN THE MORNING AND AT BEDTIME 180 tablet 0  ? mycophenolate (CELLCEPT) 500 MG tablet TAKE 1 & 1/2 (ONE & ONE-HALF) TABLETS BY MOUTH TWICE DAILY 270 tablet 0  ? pyridostigmine (MESTINON) 60 MG tablet Take 1 tablet (60 mg total) by mouth 2 (two) times daily. Refill when pt requests. 180 tablet 3  ? rosuvastatin (CRESTOR) 10 MG tablet Take 1 tablet by mouth once daily 90 tablet 0  ? tamsulosin (FLOMAX) 0.4 MG CAPS capsule Take 1 capsule (0.4 mg total) by mouth daily. 90 capsule 0  ? vitamin B-12 (CYANOCOBALAMIN) 500 MCG tablet Take 500 mcg by mouth daily.    ? Apoaequorin (PREVAGEN) 10 MG CAPS Take by mouth.    ? omeprazole (PRILOSEC) 20 MG capsule Take 20 mg by mouth daily.    ? predniSONE (DELTASONE) 2.5 MG tablet Take 2.5 mg by mouth daily with breakfast.    ? predniSONE (DELTASONE) 5 MG tablet Take 1 tablet (5 mg total) by mouth daily with  breakfast. 150 tablet 3  ? ?No facility-administered medications prior to visit.  ? ? ?No Known Allergies ? ?ROS ?Review of Systems  ?Constitutional:  Positive for fatigue.  ?Respiratory:  Negative for cough and shortness of breath.   ?Cardiovascular:  Negative for chest pain.  ?Gastrointestinal:  Negative for abdominal pain.  ?Endocrine:  Negative for polydipsia and polyuria.  ?Musculoskeletal:  Positive for arthralgias.  ? ?  ?Objective:  ?  ?Physical Exam ?Vitals reviewed.  ?Constitutional:   ?   Appearance: Normal appearance.  ?Cardiovascular:  ?   Rate and Rhythm: Normal rate and regular rhythm.  ?Pulmonary:  ?   Effort: Pulmonary effort is normal.  ?   Breath sounds: Normal breath sounds.  ?Musculoskeletal:  ?   Comments: He has good range of motion right shoulder but has pain with abduction greater than 90 degrees against resistance.  No localized tenderness. ? ?Fairly good range of motion both hips.  No significant pain with internal and external rotation.  ?Neurological:  ?   Mental Status: He is alert.  ? ? ?BP 120/68 (BP Location: Left Arm, Patient Position: Sitting, Cuff Size: Normal)   Pulse 65   Temp 97.6 ?F (36.4 ?C) (Oral)   Ht '5\' 10"'$  (1.778 m)   Wt 251 lb 6.4 oz (114 kg)   SpO2 96%   BMI 36.07 kg/m?  ?Wt Readings from Last 3 Encounters:  ?01/08/22 251 lb 6.4 oz (114 kg)  ?08/19/21 257 lb (116.6 kg)  ?05/17/21 249 lb (112.9 kg)  ? ? ? ?Health Maintenance Due  ?Topic Date Due  ? Zoster Vaccines- Shingrix (1 of 2) Never done  ? COLONOSCOPY (Pts 45-52yr Insurance coverage will need to be confirmed)  11/19/2019  ? TETANUS/TDAP  12/13/2019  ? COVID-19 Vaccine (3 - Moderna risk series) 01/11/2020  ? OPHTHALMOLOGY EXAM  07/07/2020  ? FOOT EXAM  07/14/2020  ? HEMOGLOBIN A1C  07/24/2021  ? URINE MICROALBUMIN  09/25/2021  ? ? ?There are no preventive care reminders to display for this patient. ? ?Lab Results  ?Component Value Date  ? TSH 3.54 05/14/2020  ? ?Lab Results  ?Component Value Date  ? WBC 7.7  05/20/2021  ? HGB 14.8 05/20/2021  ? HCT 44.6 05/20/2021  ? MCV 94.2 05/20/2021  ? PLT 163.0 05/20/2021  ? ?Lab Results  ?Component Value Date  ? NA 140 05/20/2021  ? K 4.5 05/20/2021  ? CO2 23 05/20/2021  ? GLUCOSE 15

## 2022-01-13 ENCOUNTER — Other Ambulatory Visit: Payer: Self-pay | Admitting: Family Medicine

## 2022-01-15 NOTE — Progress Notes (Signed)
? ? ?Subjective:   ? ?CC: B hip and B shoulder pain ? ?I, [name], LAT, ATC, am serving as scribe for Dr. [name]. ? ?HPI: Pt is a 71 y/o male c/o B hip and B shoulder pain. ? ?B hip pain: x 3-4 months. Pt locates pain to deep with bilat hip joints. ?-Radiating pain: no ?-Aggravating factors: worse in the mornings,  ?-Treatments tried: chiropractor;  ? ?B shoulder pain: R>L x a couple months.  Locates pain to deep within bilat GH joints. ?-Radiating pain: no ?-Neck pain: no ?-Shoulder mechanical symptoms: no ?-Aggravating factors: laying on R side, shoulder ABD ?-Treatments tried: chiropractor; IBU, HEP ? 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  He is very busy and active. ? ?Diagnostic testing: Pelvis XR- 01/08/22 ? ?Pertinent review of Systems: No fevers or chills ? ?Relevant historical information: Myasthenia gravis.  Diabetes. ? ? ?Objective:   ? ?Vitals:  ? 01/16/22 1041  ?BP: 122/74  ?Pulse: 62  ?SpO2: 94%  ? ?General: Well Developed, well nourished, and in no acute distress.  ? ?MSK: Right shoulder: Normal appearing ?Nontender. ?Range of motion abduction 120 degrees with pain. ?Internal rotation posterior iliac crest.  External rotation full. ?Strength 4/5 abduction.  4+/5 external rotation 5/5 internal rotation. ?Positive Hawkins and Neer's test.  Positive empty can test.  Positive crossover arm compression test. ?Negative Yergason's and speeds test. ? ?Left shoulder: Normal-appearing ?Nontender.   ?Range of motion full abduction and external rotation.  Internal rotation lumbar spine. ?Strength 5/5 throughout. ?Mildly positive Hawkins and Neer's test.  Mildly positive empty can test. ?Negative Yergason's and speeds test. ? ?Right hip: Normal-appearing ?Range of motion slightly limited flexion and internal rotation with some pain. ?Mildly tender to palpation at greater trochanter. ?Hip abduction strength 4+/5 with some pain.  External rotation strength 4/5 with  pain. ? ?Left hip: Normal  appearing ?Range of motion full ?Tender palpation greater trochanter. ?Hip abduction strength 4+/5 with some pain.  External rotation strength 4/5 with pain. ? ?Lab and Radiology Results ? ?Diagnostic Limited MSK Ultrasound of: Right shoulder ?Biceps tendon intact normal. ?Subscapularis tendon appears to be intact. ?Supraspinatus tendon appears to be intact with mild subacromial bursitis present. ?Infraspinatus tendon appears to be intact. ?AC joint degenerative with effusion. ?Impression: Subacromial bursitis and AC DJD ? ?X-ray images bilateral shoulders obtained today personally and independently interpreted ? ?Right shoulder: Trace glenohumeral DJD.  Moderate AC DJD.  No acute fractures are present. ? ?Left shoulder: Trace glenohumeral DJD.  Mild AC DJD.  No acute fractures are present ? ?Await formal radiology review ? ?EXAM: ?PELVIS - 1-2 VIEW ?  ?COMPARISON:  None. ?  ?FINDINGS: ?No acute fracture or hip dislocation is identified. Symmetric, mild ?superior hip joint space narrowing and marginal spurring are noted ?bilaterally. Small calcifications in the pelvis are compatible with ?phleboliths. ?  ?IMPRESSION: ?Mild bilateral hip osteoarthrosis. ?  ?  ?Electronically Signed ?  By: Logan Bores M.D. ?  On: 01/09/2022 10:05 ?I, [name], personally (independently) visualized and performed the interpretation of the images attached in this note. ? ? ?Impression and Recommendations:   ? ?Assessment and Plan: ?71 y.o. male with bilateral shoulder pain right worse than left.  Shoulder pain thought to be due to rotator cuff tendinopathy impingement and bursitis especially in the right.Marland Kitchen ?He is a good candidate for physical therapy.  He lives in the [place] area.  Plan to refer to PT if there  and check back again in about 8 weeks.  If needed steroid injection certainly is a possibility. ? ?Hip pain bilaterally.  His pain is primarily located at the lateral hip and is thought to be due to  greater trochanteric bursitis and hip abductor tendinopathy.  He does have some hip DJD but I do not think that is the main source of his pain.  Again PT should be helpful here. ? ?Check 8 weeks ? ?PDMP not reviewed this encounter. ?Orders Placed This Encounter  ?Procedures  ? [name] LIMITED JOINT SPACE STRUCTURES UP BILAT(NO LINKED CHARGES)  ?  Order Specific Question:   Reason for Exam (SYMPTOM  OR DIAGNOSIS REQUIRED)  ?  Answer:   bilateral shoulder pain  ?  Order Specific Question:   Preferred imaging location?  ?  Answer:   [place]  ? DG Shoulder Left  ?  Standing Status:   Future  ?  Number of Occurrences:   1  ?  Standing Expiration Date:   02/15/2022  ?  Order Specific Question:   Reason for Exam (SYMPTOM  OR DIAGNOSIS REQUIRED)  ?  Answer:   bilateral shoulder pain  ?  Order Specific Question:   Preferred imaging location?  ?  Answer:   [facility]  ? DG Shoulder Right  ?  Standing Status:   Future  ?  Number of Occurrences:   1  ?  Standing Expiration Date:   01/17/2023  ?  Order Specific Question:   Reason for Exam (SYMPTOM  OR DIAGNOSIS REQUIRED)  ?  Answer:   bilateral shoulder pain  ?  Order Specific Question:   Preferred imaging location?  ?  Answer:   [facility]  ? Ambulatory referral to Physical Therapy  ?  Referral Priority:   Routine  ?  Referral Type:   Physical Medicine  ?  Referral Reason:   Specialty Services Required  ?  Requested Specialty:   Physical Therapy  ?  Number of Visits Requested:   1  ? ?No orders of the defined types were placed in this encounter. ? ? ?Discussed warning signs or symptoms. Please see discharge instructions. Patient expresses understanding. ? ? ?The above documentation has been reviewed and is accurate and complete [name], M.D. ? ?

## 2022-01-16 ENCOUNTER — Ambulatory Visit (INDEPENDENT_AMBULATORY_CARE_PROVIDER_SITE_OTHER): Payer: Medicare Other

## 2022-01-16 ENCOUNTER — Ambulatory Visit: Payer: Medicare Other | Admitting: Family Medicine

## 2022-01-16 ENCOUNTER — Ambulatory Visit: Payer: Self-pay

## 2022-01-16 VITALS — BP 122/74 | HR 62 | Ht 70.0 in | Wt 250.8 lb

## 2022-01-16 DIAGNOSIS — M25511 Pain in right shoulder: Secondary | ICD-10-CM

## 2022-01-16 DIAGNOSIS — M25512 Pain in left shoulder: Secondary | ICD-10-CM

## 2022-01-16 DIAGNOSIS — G8929 Other chronic pain: Secondary | ICD-10-CM | POA: Diagnosis not present

## 2022-01-16 DIAGNOSIS — M25552 Pain in left hip: Secondary | ICD-10-CM | POA: Diagnosis not present

## 2022-01-16 DIAGNOSIS — M25551 Pain in right hip: Secondary | ICD-10-CM | POA: Diagnosis not present

## 2022-01-16 NOTE — Patient Instructions (Addendum)
Thank you for coming in today.  ? ?Please get an Xray today before you leave  ? ?I've referred you to Physical Therapy.  Let us know if you don't hear from them in one week.  ? ?Recheck back in 8 weeks ?

## 2022-01-17 ENCOUNTER — Telehealth: Payer: Self-pay | Admitting: Family Medicine

## 2022-01-17 DIAGNOSIS — M8580 Other specified disorders of bone density and structure, unspecified site: Secondary | ICD-10-CM

## 2022-01-17 NOTE — Progress Notes (Signed)
Right shoulder shows mild arthritis of the main shoulder joint and moderate arthritis of the [facility] joint at the top of the shoulder.

## 2022-01-17 NOTE — Progress Notes (Signed)
Left shoulder x-ray shows an erosion in the shoulder blade.  This was seen on x-ray somewhat accidentally.  The radiologist is a little concerned about this and would like me to order a CT scan of the chest. I have ordered a chest CT scan.  ? ?You do have a little shoulder arthritis. No fracture is seen.

## 2022-01-17 NOTE — Telephone Encounter (Signed)
I called and left a message about the shoulder x-ray results and ordered a CT scan of the chest. ?

## 2022-01-21 DIAGNOSIS — M5411 Radiculopathy, occipito-atlanto-axial region: Secondary | ICD-10-CM | POA: Diagnosis not present

## 2022-01-21 DIAGNOSIS — M9901 Segmental and somatic dysfunction of cervical region: Secondary | ICD-10-CM | POA: Diagnosis not present

## 2022-01-23 ENCOUNTER — Ambulatory Visit: Payer: Medicare Other | Attending: Family Medicine

## 2022-01-23 DIAGNOSIS — M25552 Pain in left hip: Secondary | ICD-10-CM | POA: Insufficient documentation

## 2022-01-23 DIAGNOSIS — M25551 Pain in right hip: Secondary | ICD-10-CM | POA: Insufficient documentation

## 2022-01-23 DIAGNOSIS — G8929 Other chronic pain: Secondary | ICD-10-CM | POA: Insufficient documentation

## 2022-01-23 DIAGNOSIS — M25611 Stiffness of right shoulder, not elsewhere classified: Secondary | ICD-10-CM | POA: Diagnosis not present

## 2022-01-23 DIAGNOSIS — M25612 Stiffness of left shoulder, not elsewhere classified: Secondary | ICD-10-CM | POA: Diagnosis not present

## 2022-01-23 DIAGNOSIS — M25511 Pain in right shoulder: Secondary | ICD-10-CM | POA: Insufficient documentation

## 2022-01-23 DIAGNOSIS — M25512 Pain in left shoulder: Secondary | ICD-10-CM | POA: Insufficient documentation

## 2022-01-23 NOTE — Therapy (Signed)
[facility] ?[facility] ?[address] ?[place], [place], [address] ?Phone: [phone]   Fax:  [phone] ? ?Physical Therapy Evaluation ? ?Patient Details  ?Name: Rodney Martinez ?MRN: [id number] ?Date of Birth: Mar 02, 1951 ?Referring Provider (PT): Georgina Snell, MD ? ? ?Encounter Date: 01/23/2022 ? ? PT End of Session - 01/23/22 1838   ? ? Visit Number 1   ? Number of Visits 6   ? Date for PT Re-Evaluation 03/07/22   ? PT Start Time [phone]   ? PT Stop Time 1034   ? PT Time Calculation (min) 44 min   ? Activity Tolerance Patient tolerated treatment well   ? Behavior During Therapy [facility] for tasks assessed/performed   ? ?  ?  ? ?  ? ? ?Past Medical History:  ?Diagnosis Date  ? Cataract 2020, 2021  ? Both eyes  ? Diabetes mellitus without complication ([place])   ? GERD 12/12/2009  ? HYPERGLYCEMIA 12/12/2009  ? Myasthenia gravis ([place])   ? OBSTRUCTIVE SLEEP APNEA 08/14/2010  ? 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 left upper back - tx p bx  ? 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 left upper outer eyelid - CX3 + 5FU  ? Sleep apnea   ? uses CPAP   ? Thyroid disease   ? TRANSAMINASES, SERUM, ELEVATED 12/12/2009  ? ? ?Past Surgical History:  ?Procedure Laterality Date  ? BASAL CELL CARCINOMA EXCISION  2007  ? face (Mohs)  ? CATARACT EXTRACTION Right 08/2019  ? COLONOSCOPY  04/15/2012  ? HERNIA REPAIR  2993  ? umbilical  ? POLYPECTOMY    ? ? ?There were no vitals filed for this visit. ? ? ? Subjective Assessment - 01/23/22 0952   ? ? Subjective Patient reports that his shoulders and hips have been bothering him for about 3-4 months now with his right shoulder being the worst. He is unsure what caused his pain to begin. However, it has been getting a little better since it first began. He is doing some stretching at home which seems to help some.   ? Pertinent History platar fasciatis   ? Limitations Standing;House hold activities;Walking   ? How long can you stand comfortably? 10-15 minutes   ? Patient  Stated Goals improved strength, improved ability reaching overhead, and improved ability to play golf   ? Currently in Pain? Yes   ? Pain Score 3    ? Pain Location Shoulder   ? Pain Orientation Right;Left   R>L  ? Pain Descriptors / Indicators Aching   ? Pain Type Chronic pain   ? Pain Radiating Towards no radiating pain   ? Pain Onset More than a month ago   ? Pain Frequency Intermittent   ? Aggravating Factors  reaching overhead, sidelying   ? Pain Relieving Factors letting his arm back down (about 1 minute to resolve)   ? Multiple Pain Sites Yes   ? Pain Score 2   ? Pain Location Hip   ? Pain Orientation Right;Left   L>R  ? Pain Descriptors / Indicators Aching   ? Pain Type Chronic pain   ? Pain Radiating Towards no radiating pain   ? Pain Onset More than a month ago   ? Pain Frequency Intermittent   ? Aggravating Factors  standing, playing golf, sidelying, sitting for long periods   ? Pain Relieving Factors sitting, walking   ? Effect of Pain on Daily Activities limited playing golf   ? ?  ?  ? ?  ? ? ? ? ?  [facility] PT Assessment - 01/23/22 0001   ? ?  ? Assessment  ? Medical Diagnosis Bilateral shoulder and hip pain   ? Referring Provider (PT) Georgina Snell, MD   ? Onset Date/Surgical Date --   3-4 months ago  ? Hand Dominance Right   ? Next MD Visit 03/18/22   ? Prior Therapy No   tried chiropractor with no change  ?  ? Precautions  ? Precautions None   ?  ? Restrictions  ? Weight Bearing Restrictions No   ?  ? Balance Screen  ? Has the patient fallen in the past 6 months No   ? Has the patient had a decrease in activity level because of a fear of falling?  No   ? Is the patient reluctant to leave their home because of a fear of falling?  No   ?  ? Home Environment  ? Living Environment Private residence   ? Home Access Stairs to enter   ? Home Layout One level   ?  ? Prior Function  ? Level of Independence Independent   ? Vocation Retired   ? Leisure golf, yardwork   ?  ? Cognition  ? Overall Cognitive Status Within  Functional Limits for tasks assessed   ? Attention Focused   ? Focused Attention Appears intact   ? Memory Appears intact   ? Awareness Appears intact   ? Problem Solving Appears intact   ?  ? Sensation  ? Additional Comments Patient reports no numbness or tingling   ?  ? ROM / Strength  ? AROM / PROM / Strength Strength;AROM   ?  ? AROM  ? AROM Assessment Site Shoulder   ? Right/Left Shoulder Left;Right   ? Right Shoulder Flexion 110 Degrees   painful  ? Right Shoulder ABduction 90 Degrees   painful  ? Left Shoulder Flexion 119 Degrees   painful but less than R  ? Left Shoulder ABduction 106 Degrees   painful but less than right  ?  ? Strength  ? Strength Assessment Site Shoulder;Hip;Knee   ? Right/Left Shoulder Right;Left   ? Right Shoulder Flexion 3+/5   painful  ? Right Shoulder ABduction 4/5   painful  ? Right Shoulder Internal Rotation 4/5   slight pain  ? Right Shoulder External Rotation 4-/5   slight pain  ? Left Shoulder Flexion 3+/5   ? Left Shoulder ABduction 4/5   painful  ? Left Shoulder Internal Rotation 4/5   slight pain  ? Left Shoulder External Rotation 4-/5   slight pain  ? Right/Left Hip Right;Left   ? Right Hip Flexion 4-/5   ? Left Hip Flexion 4/5   ? Right/Left Knee Right;Left   ? Right Knee Flexion 4+/5   ? Right Knee Extension 5/5   ? Left Knee Flexion 4/5   ? Left Knee Extension 5/5   ?  ? Palpation  ? Spinal mobility Cervical and thoracic spine: hypomobile and nonpainful   ? Palpation comment TTP: left UT and bilateral infraspinatus   ? ?  ?  ? ?  ? ? ? ? ? ? ? ? ? ? ? ? ? ?Objective measurements completed on examination: See above findings.  ? ? ? ? ? [facility] Adult PT Treatment/Exercise - 01/23/22 0001   ? ?  ? Exercises  ? Exercises Shoulder   ?  ? Shoulder Exercises: Seated  ? Retraction Both;12 reps   ?  ? Shoulder Exercises: ROM/Strengthening  ?  Wall Wash flexion; BUE; 10 reps each   ?  ? Shoulder Exercises: Isometric Strengthening  ? External Rotation Other (comment)   BUE; 5 second  hold; 10 reps each  ? ?  ?  ? ?  ? ? ? ? ? ? ? ? ? ? ? ? ? ? ? PT Long Term Goals - 01/23/22 1846   ? ?  ? PT LONG TERM GOAL #1  ? Title Patient will be independent with his HEP.   ? Time 6   ? Period Weeks   ? Status New   ? Target Date 03/06/22   ?  ? PT LONG TERM GOAL #2  ? Title Patient will be able to demonstrate at least 120 degrees of shoulder flexion bilaterally for improved function reaching overhead.   ? Time 6   ? Period Weeks   ? Status New   ? Target Date 03/06/22   ?  ? PT LONG TERM GOAL #3  ? Title Patient will be able to return to playing golf without being limited by his familiar hip pain for improved function with his hobbies.   ? Time 6   ? Period Weeks   ? Status New   ? Target Date 03/06/22   ?  ? PT LONG TERM GOAL #4  ? Title Patient will be able to lift at least 3 pounds overhead for improved function lifting his household appliances overhead.   ? Time 6   ? Period Weeks   ? Status New   ? Target Date 03/06/22   ? ?  ?  ? ?  ? ? ? ? ? ? ? ? ? Plan - 01/23/22 1839   ? ? Clinical Impression Statement Patient is a 71 year old male presenting to physical therapy with chronic pain in both shoulders and hips with his primary complaint being of his right shoulder. He presented with low to moderate pain severity and irritability. He exhibited reduced strength globally in addition to reduced AROM of both shoulders. His evaluation focused primarily on his shoulders as this was where he reported his primary limitation with his functional activities. Recommend that he continue with skilled physical therapy to address his remaining impairments to return to his prior level of function.   ? Personal Factors and Comorbidities Other;Time since onset of injury/illness/exacerbation;Comorbidity 1   ? Comorbidities DM   ? Examination-Activity Limitations Reach Overhead;Sit;Sleep   ? Examination-Participation Restrictions Occupation;Cleaning;Community Activity;[employer] Work   ? Stability/Clinical Decision Making  Evolving/Moderate complexity   ? Clinical Decision Making Moderate   ? Rehab Potential Good   ? PT Frequency 1x / week   ? PT Duration 6 weeks   ? PT Treatment/Interventions ADLs/Self Care Home Management;[employer]

## 2022-01-28 ENCOUNTER — Ambulatory Visit: Payer: Medicare Other | Admitting: *Deleted

## 2022-01-28 DIAGNOSIS — M25551 Pain in right hip: Secondary | ICD-10-CM | POA: Diagnosis not present

## 2022-01-28 DIAGNOSIS — G8929 Other chronic pain: Secondary | ICD-10-CM | POA: Diagnosis not present

## 2022-01-28 DIAGNOSIS — M25612 Stiffness of left shoulder, not elsewhere classified: Secondary | ICD-10-CM

## 2022-01-28 DIAGNOSIS — M25552 Pain in left hip: Secondary | ICD-10-CM | POA: Diagnosis not present

## 2022-01-28 DIAGNOSIS — M25611 Stiffness of right shoulder, not elsewhere classified: Secondary | ICD-10-CM

## 2022-01-28 DIAGNOSIS — M25511 Pain in right shoulder: Secondary | ICD-10-CM | POA: Diagnosis not present

## 2022-01-28 DIAGNOSIS — M25512 Pain in left shoulder: Secondary | ICD-10-CM | POA: Diagnosis not present

## 2022-01-28 NOTE — Therapy (Signed)
[facility] ?[facility] ?[place] ?[place], [place], [address] ?Phone: [phone]   Fax:  [phone] ? ?Physical Therapy Treatment ? ?Patient Details  ?Name: Rodney Martinez ?MRN: [id number] ?Date of Birth: 05/15/1951 ?Referring Provider (PT): Georgina Snell, MD ? ? ?Encounter Date: 01/28/2022 ? ? PT End of Session - 01/28/22 0940   ? ? Visit Number 2   ? Number of Visits 6   ? Date for PT Re-Evaluation 03/07/22   ? PT Start Time 7408   ? PT Stop Time 1032   ? PT Time Calculation (min) 47 min   ? ?  ?  ? ?  ? ? ?Past Medical History:  ?Diagnosis Date  ? Cataract 2020, 2021  ? Both eyes  ? Diabetes mellitus without complication ([place])   ? GERD 12/12/2009  ? HYPERGLYCEMIA 12/12/2009  ? Myasthenia gravis ([place])   ? OBSTRUCTIVE SLEEP APNEA 08/14/2010  ? 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 left upper back - tx p bx  ? 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 left upper outer eyelid - CX3 + 5FU  ? Sleep apnea   ? uses CPAP   ? Thyroid disease   ? TRANSAMINASES, SERUM, ELEVATED 12/12/2009  ? ? ?Past Surgical History:  ?Procedure Laterality Date  ? BASAL CELL CARCINOMA EXCISION  2007  ? face (Mohs)  ? CATARACT EXTRACTION Right 08/2019  ? COLONOSCOPY  04/15/2012  ? HERNIA REPAIR  1448  ? umbilical  ? POLYPECTOMY    ? ? ?There were no vitals filed for this visit. ? ? Subjective Assessment - 01/28/22 0939   ? ? Subjective Doing better today with less pain in RT shldr   ? Pertinent History platar fasciatis   ? Limitations Standing;House hold activities;Walking   ? How long can you stand comfortably? 10-15 minutes   ? Patient Stated Goals improved strength, improved ability reaching overhead, and improved ability to play golf   ? Currently in Pain? Yes   ? Pain Score 2    ? Pain Location Shoulder   ? Pain Orientation Right;Left   ? Pain Descriptors / Indicators Aching;Sore   ? Pain Type Chronic pain   ? Pain Onset More than a month ago   ? Pain Score 2   ? Pain Location Hip   ? Pain Orientation  Right;Left   ? Pain Type Chronic pain   ? ?  ?  ? ?  ? ? ? ? ? ? ? ? ? ? ? ? ? ? ? ? ? ? ? ? [facility] Adult PT Treatment/Exercise - 01/28/22 0001   ? ?  ? Exercises  ? Exercises Shoulder   ?  ? Shoulder Exercises: Standing  ? Horizontal ABduction Strengthening;Theraband   ? External Rotation Strengthening;Right   ? Extension Right;Strengthening;Theraband   2x10-15  ? Theraband Level (Shoulder Extension) Level 1 (Yellow)   ? Other Standing Exercises yellow tband and handout given for shldr HEP and Hip exs   ?  ? Shoulder Exercises: Pulleys  ? Flexion 3 minutes   ?  ? Shoulder Exercises: ROM/Strengthening  ? Nustep L3 seat 11 x 13 mins   ? ?  ?  ? ?  ? ? ? ? ? ? ? ? ? ? ? ? ? ? ? PT Long Term Goals - 01/23/22 1846   ? ?  ? PT LONG TERM GOAL #1  ? Title Patient will be independent with his HEP.   ? Time 6   ?  Period Weeks   ? Status New   ? Target Date 03/06/22   ?  ? PT LONG TERM GOAL #2  ? Title Patient will be able to demonstrate at least 120 degrees of shoulder flexion bilaterally for improved function reaching overhead.   ? Time 6   ? Period Weeks   ? Status New   ? Target Date 03/06/22   ?  ? PT LONG TERM GOAL #3  ? Title Patient will be able to return to playing golf without being limited by his familiar hip pain for improved function with his hobbies.   ? Time 6   ? Period Weeks   ? Status New   ? Target Date 03/06/22   ?  ? PT LONG TERM GOAL #4  ? Title Patient will be able to lift at least 3 pounds overhead for improved function lifting his household appliances overhead.   ? Time 6   ? Period Weeks   ? Status New   ? Target Date 03/06/22   ? ?  ?  ? ?  ? ? ? ? ? ? ? ? Plan - 01/28/22 0940   ? ? Clinical Impression Statement Pt arrived today doing a little better with less pain in bil hips and shldrs. Rx focused on RT shldr due to it being the most painful. He was instructed in [place] for HEP for Both shoulders. Yellow tband  and handout given for HEP as well as handout for Hip ER and abduction Bil hips. Pt did  well with all shldr exs, but had some discomfort with Hip exs. Discussed with Pt to try sidelying exs at home and if to difficult we can try sitting and standing ones.   ? Personal Factors and Comorbidities Other;Time since onset of injury/illness/exacerbation;Comorbidity 1   ? Comorbidities DM   ? Examination-Activity Limitations Reach Overhead;Sit;Sleep   ? Examination-Participation Restrictions Occupation;Cleaning;Community Activity;[employer] Work   ? Stability/Clinical Decision Making Evolving/Moderate complexity   ? Rehab Potential Good   ? PT Frequency 1x / week   ? PT Duration 6 weeks   ? PT Treatment/Interventions ADLs/Self Care Home Management;Cryotherapy;Electrical Stimulation;Moist Heat;Stair training;Functional mobility training;Therapeutic activities;Therapeutic exercise;Neuromuscular re-education;Manual techniques;Patient/family education;Passive range of motion;Dry needling;Vasopneumatic Device   ? PT Next Visit Plan nustep/pulleys, (right shoulder is primary limitation at evaluation)   Assess HEP   ? Consulted and Agree with Plan of Care Patient   ? ?  ?  ? ?  ? ? ?Patient will benefit from skilled therapeutic intervention in order to improve the following deficits and impairments:  Decreased range of motion, Difficulty walking, Impaired UE functional use, Decreased activity tolerance, Pain, Hypomobility, Decreased strength, Decreased mobility ? ?Visit Diagnosis: ?Chronic right shoulder pain ? ?Chronic left shoulder pain ? ?Stiffness of right shoulder, not elsewhere classified ? ?Stiffness of left shoulder, not elsewhere classified ? ?Pain in left hip ? ?Pain in right hip ? ? ? ? ?Problem List ?Patient Active Problem List  ? Diagnosis Date Noted  ? Dyslipidemia 09/13/2020  ? 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 Hypothyroid 04/05/2019  ? Easy bruising 04/19/2018  ? BPH (benign prostatic hyperplasia) 02/01/2018  ? Bradycardia 12/31/2017  ? Myasthenia gravis with acute exacerbation ([place]) 12/02/2017   ? Tubular adenoma 11/02/2017  ? Slurred speech 10/27/2017  ? OBSTRUCTIVE SLEEP APNEA 08/14/2010  ? GERD 12/12/2009  ? Type 2 diabetes mellitus with hyperglycemia ([place]) 12/12/2009  ? TRANSAMINASES, SERUM, ELEVATED 12/12/2009  ? ? ?Ramel Tobon,CHRIS, PTA ?01/28/2022, 2:34 PM ? ?[facility] ?[facility] ?[address] ?[place], [place], [address] ?Phone: [phone]   Fax:  [phone] ? ?Name: Rodney Martinez ?MRN: [id number] ?Date of Birth: 03-15-1951 ? ? ? ?

## 2022-01-29 ENCOUNTER — Encounter: Payer: Self-pay | Admitting: Family Medicine

## 2022-01-29 ENCOUNTER — Other Ambulatory Visit: Payer: Self-pay | Admitting: Family Medicine

## 2022-02-04 ENCOUNTER — Ambulatory Visit: Payer: Medicare Other | Attending: Family Medicine

## 2022-02-04 DIAGNOSIS — G8929 Other chronic pain: Secondary | ICD-10-CM | POA: Diagnosis not present

## 2022-02-04 DIAGNOSIS — M25512 Pain in left shoulder: Secondary | ICD-10-CM | POA: Insufficient documentation

## 2022-02-04 DIAGNOSIS — M25551 Pain in right hip: Secondary | ICD-10-CM | POA: Insufficient documentation

## 2022-02-04 DIAGNOSIS — M25552 Pain in left hip: Secondary | ICD-10-CM | POA: Diagnosis not present

## 2022-02-04 DIAGNOSIS — M25611 Stiffness of right shoulder, not elsewhere classified: Secondary | ICD-10-CM | POA: Insufficient documentation

## 2022-02-04 DIAGNOSIS — M25511 Pain in right shoulder: Secondary | ICD-10-CM | POA: Diagnosis not present

## 2022-02-04 DIAGNOSIS — M25612 Stiffness of left shoulder, not elsewhere classified: Secondary | ICD-10-CM | POA: Diagnosis not present

## 2022-02-04 NOTE — Therapy (Signed)
[place] ?[facility] ?[place] ?[place], [place], [address] ?Phone: [phone]   Fax:  [phone] ? ?Physical Therapy Treatment ? ?Patient Details  ?Name: Rodney Martinez ?MRN: [id number] ?Date of Birth: 12-24-1950 ?Referring Provider (PT): Georgina Snell, MD ? ? ?Encounter Date: 02/04/2022 ? ? PT End of Session - 02/04/22 1001   ? ? Visit Number 3   ? Number of Visits 6   ? Date for PT Re-Evaluation 03/07/22   ? PT Start Time [phone]   ? PT Stop Time 1029   ? PT Time Calculation (min) 42 min   ? Activity Tolerance Patient tolerated treatment well   ? Behavior During Therapy [facility] for tasks assessed/performed   ? ?  ?  ? ?  ? ? ?Past Medical History:  ?Diagnosis Date  ? Cataract 2020, 2021  ? Both eyes  ? Diabetes mellitus without complication ([place])   ? GERD 12/12/2009  ? HYPERGLYCEMIA 12/12/2009  ? Myasthenia gravis ([place])   ? OBSTRUCTIVE SLEEP APNEA 08/14/2010  ? 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 left upper back - tx p bx  ? 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 left upper outer eyelid - CX3 + 5FU  ? Sleep apnea   ? uses CPAP   ? Thyroid disease   ? TRANSAMINASES, SERUM, ELEVATED 12/12/2009  ? ? ?Past Surgical History:  ?Procedure Laterality Date  ? BASAL CELL CARCINOMA EXCISION  2007  ? face (Mohs)  ? CATARACT EXTRACTION Right 08/2019  ? COLONOSCOPY  04/15/2012  ? HERNIA REPAIR  3976  ? umbilical  ? POLYPECTOMY    ? ? ?There were no vitals filed for this visit. ? ? Subjective Assessment - 02/04/22 0948   ? ? Subjective Patient reports that he was doing better, but he had a lot more difficulty sleeping last night due to his shoulder.   ? Pertinent History platar fasciatis   ? Limitations Standing;House hold activities;Walking   ? How long can you stand comfortably? 10-15 minutes   ? Patient Stated Goals improved strength, improved ability reaching overhead, and improved ability to play golf   ? Currently in Pain? Yes   ? Pain Score 3    ? Pain Location Shoulder   ? Pain Orientation  Right;Left   ? Pain Onset More than a month ago   ? Pain Score 0   ? Pain Location Hip   ? Pain Orientation Left;Right   ? ?  ?  ? ?  ? ? ? ? ? ? ? ? ? ? ? ? ? ? ? ? ? ? ? ? [place] Adult PT Treatment/Exercise - 02/04/22 0001   ? ?  ? Shoulder Exercises: Standing  ? External Rotation Strengthening;20 reps;Theraband;Both   ? Theraband Level (Shoulder External Rotation) Level 3 (Green)   ? Internal Rotation Strengthening;Both;20 reps;Theraband   ? Theraband Level (Shoulder Internal Rotation) Level 3 (Green)   ? Extension Both;20 reps   ? Extension Weight (lbs) Orange XTS   ? Row Both;20 reps   ? Row Weight (lbs) Orange XTS   ? Other Standing Exercises AA ER with cane   5 second hold; 2 minutes  ?  ? Shoulder Exercises: Pulleys  ? Scaption 5 minutes   ?  ? Shoulder Exercises: ROM/Strengthening  ? Nustep L6 x 13 minutes   ?  ? Shoulder Exercises: Stretch  ? Internal Rotation Stretch 2 reps   30 second hold; limited by pain; modified to AA ER with cane  ? ?  ?  ? ?  ? ? ? ? ? ? ? ? ? ? ? ? ? ? ?  PT Long Term Goals - 01/23/22 1846   ? ?  ? PT LONG TERM GOAL #1  ? Title Patient will be independent with his HEP.   ? Time 6   ? Period Weeks   ? Status New   ? Target Date 03/06/22   ?  ? PT LONG TERM GOAL #2  ? Title Patient will be able to demonstrate at least 120 degrees of shoulder flexion bilaterally for improved function reaching overhead.   ? Time 6   ? Period Weeks   ? Status New   ? Target Date 03/06/22   ?  ? PT LONG TERM GOAL #3  ? Title Patient will be able to return to playing golf without being limited by his familiar hip pain for improved function with his hobbies.   ? Time 6   ? Period Weeks   ? Status New   ? Target Date 03/06/22   ?  ? PT LONG TERM GOAL #4  ? Title Patient will be able to lift at least 3 pounds overhead for improved function lifting his household appliances overhead.   ? Time 6   ? Period Weeks   ? Status New   ? Target Date 03/06/22   ? ?  ?  ? ?  ? ? ? ? ? ? ? ? Plan - 02/04/22 1015   ? ?  Clinical Impression Statement Treatment focused on shoulder interventions as this was his primary limitation to his daily activities. He was introduced to multiple new interventions for improved rotator cuff strengthening and shoulder mobility. He required minimal cueing with today's external rotation interventions to prevent abduction. He experienced increased right shoulder pain when an internal rotation stretch was attempted. This was able to be reduced with AA external rotation. He reported that his shoulders felt alright upon the conclusion of treatment. He continues to require skilled physical therapy to address his remaining impairments to return to his prior level of function.   ? Personal Factors and Comorbidities Other;Time since onset of injury/illness/exacerbation;Comorbidity 1   ? Comorbidities DM   ? Examination-Activity Limitations Reach Overhead;Sit;Sleep   ? Examination-Participation Restrictions Occupation;Cleaning;Community Activity;[employer] Work   ? Stability/Clinical Decision Making Evolving/Moderate complexity   ? Rehab Potential Good   ? PT Frequency 1x / week   ? PT Duration 6 weeks   ? PT Treatment/Interventions ADLs/Self Care Home Management;Cryotherapy;Electrical Stimulation;Moist Heat;Stair training;Functional mobility training;Therapeutic activities;Therapeutic exercise;Neuromuscular re-education;Manual techniques;Patient/family education;Passive range of motion;Dry needling;Vasopneumatic Device   ? PT Next Visit Plan nustep/pulleys, (right shoulder is primary limitation at evaluation)   Assess HEP   ? Consulted and Agree with Plan of Care Patient   ? ?  ?  ? ?  ? ? ?Patient will benefit from skilled therapeutic intervention in order to improve the following deficits and impairments:  Decreased range of motion, Difficulty walking, Impaired UE functional use, Decreased activity tolerance, Pain, Hypomobility, Decreased strength, Decreased mobility ? ?Visit Diagnosis: ?Chronic right shoulder  pain ? ?Chronic left shoulder pain ? ?Stiffness of right shoulder, not elsewhere classified ? ?Stiffness of left shoulder, not elsewhere classified ? ?Pain in left hip ? ?Pain in right hip ? ? ? ? ?Problem List ?Patient Active Problem List  ? Diagnosis Date Noted  ? Dyslipidemia 09/13/2020  ? 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 Hypothyroid 04/05/2019  ? Easy bruising 04/19/2018  ? BPH (benign prostatic hyperplasia) 02/01/2018  ? Bradycardia 12/31/2017  ? Myasthenia gravis with acute exacerbation ([place]) 12/02/2017  ? Tubular adenoma  11/02/2017  ? Slurred speech 10/27/2017  ? OBSTRUCTIVE SLEEP APNEA 08/14/2010  ? GERD 12/12/2009  ? Type 2 diabetes mellitus with hyperglycemia ([place]) 12/12/2009  ? TRANSAMINASES, SERUM, ELEVATED 12/12/2009  ? ? ?Darlin Coco, PT ?02/04/2022, 4:43 PM ? ?[place] ?[facility] ?[place] [place] ?[place], [place], [address] ?Phone: [phone]   Fax:  [phone] ? ?Name: Rodney Martinez ?MRN: [id number] ?Date of Birth: 17-Feb-1951 ? ? ? ?

## 2022-02-05 DIAGNOSIS — M9901 Segmental and somatic dysfunction of cervical region: Secondary | ICD-10-CM | POA: Diagnosis not present

## 2022-02-05 DIAGNOSIS — M5411 Radiculopathy, occipito-atlanto-axial region: Secondary | ICD-10-CM | POA: Diagnosis not present

## 2022-02-11 ENCOUNTER — Encounter: Payer: Self-pay | Admitting: Physical Therapy

## 2022-02-11 ENCOUNTER — Ambulatory Visit: Payer: Medicare Other | Admitting: Physical Therapy

## 2022-02-11 DIAGNOSIS — M25551 Pain in right hip: Secondary | ICD-10-CM | POA: Diagnosis not present

## 2022-02-11 DIAGNOSIS — M25611 Stiffness of right shoulder, not elsewhere classified: Secondary | ICD-10-CM | POA: Diagnosis not present

## 2022-02-11 DIAGNOSIS — M25552 Pain in left hip: Secondary | ICD-10-CM

## 2022-02-11 DIAGNOSIS — M25512 Pain in left shoulder: Secondary | ICD-10-CM | POA: Diagnosis not present

## 2022-02-11 DIAGNOSIS — M25612 Stiffness of left shoulder, not elsewhere classified: Secondary | ICD-10-CM | POA: Diagnosis not present

## 2022-02-11 DIAGNOSIS — G8929 Other chronic pain: Secondary | ICD-10-CM

## 2022-02-11 DIAGNOSIS — M25511 Pain in right shoulder: Secondary | ICD-10-CM | POA: Diagnosis not present

## 2022-02-11 NOTE — Therapy (Signed)
[place] ?[facility] ?[place] ?[place], [place], [address] ?Phone: [phone]   Fax:  [phone] ? ?Physical Therapy Treatment ? ?Patient Details  ?Name: Rodney Martinez ?MRN: [id number] ?Date of Birth: 01-24-1951 ?Referring Provider (PT): Georgina Snell, MD ? ? ?Encounter Date: 02/11/2022 ? ? PT End of Session - 02/11/22 1008   ? ? Visit Number 4   ? Number of Visits 6   ? Date for PT Re-Evaluation 03/07/22   ? PT Start Time [phone]   ? PT Stop Time 1030   ? PT Time Calculation (min) 35 min   ? Activity Tolerance Patient tolerated treatment well   ? Behavior During Therapy [facility] for tasks assessed/performed   ? ?  ?  ? ?  ? ? ?Past Medical History:  ?Diagnosis Date  ? Cataract 2020, 2021  ? Both eyes  ? Diabetes mellitus without complication ([place])   ? GERD 12/12/2009  ? HYPERGLYCEMIA 12/12/2009  ? Myasthenia gravis ([place])   ? OBSTRUCTIVE SLEEP APNEA 08/14/2010  ? 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 left upper back - tx p bx  ? 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 left upper outer eyelid - CX3 + 5FU  ? Sleep apnea   ? uses CPAP   ? Thyroid disease   ? TRANSAMINASES, SERUM, ELEVATED 12/12/2009  ? ? ?Past Surgical History:  ?Procedure Laterality Date  ? BASAL CELL CARCINOMA EXCISION  2007  ? face (Mohs)  ? CATARACT EXTRACTION Right 08/2019  ? COLONOSCOPY  04/15/2012  ? HERNIA REPAIR  9485  ? umbilical  ? POLYPECTOMY    ? ? ?There were no vitals filed for this visit. ? ? Subjective Assessment - 02/11/22 0956   ? ? Subjective Reports that his shoulders are better. Reports still having difficulty in reaching and sleeping on R shoulder. Patient states that R shoulder is worse.   ? Pertinent History platar fasciatis   ? Limitations Standing;House hold activities;Walking   ? How long can you stand comfortably? 10-15 minutes   ? Patient Stated Goals improved strength, improved ability reaching overhead, and improved ability to play golf   ? Currently in Pain? No/denies   ? ?  ?  ? ?  ? ? ? ? ?  [facility] PT Assessment - 02/11/22 0001   ? ?  ? Assessment  ? Medical Diagnosis Bilateral shoulder and hip pain   ? Referring Provider (PT) Georgina Snell, MD   ? Hand Dominance Right   ? Next MD Visit 03/18/22   ? Prior Therapy No   ?  ? Precautions  ? Precautions None   ?  ? Restrictions  ? Weight Bearing Restrictions No   ? ?  ?  ? ?  ? ? ? ? ? ? ? ? ? ? ? ? ? ? ? ? [place] Adult PT Treatment/Exercise - 02/11/22 0001   ? ?  ? Exercises  ? Exercises Shoulder;Knee/Hip   ?  ? Knee/Hip Exercises: Standing  ? Hip Flexion Stengthening;Both;20 reps;Knee bent;Limitations   ? Hip Flexion Limitations red theraband   ? Hip Abduction Stengthening;Both;20 reps;Knee straight;Limitations   ? Abduction Limitations red theraband   ? Hip Extension Stengthening;Both;20 reps;Knee straight;Limitations   ? Extension Limitations red theraband   ?  ? Knee/Hip Exercises: Seated  ? Clamshell with TheraBand Red   x20 reps  ? Marching Strengthening;Both;20 reps;Limitations   ? Marching Limitations red theraband   ? Sit to Sand 15 reps;with UE support   red  theraband  ?  ? Shoulder Exercises: Standing  ? External Rotation Strengthening;20 reps;Theraband;Both   ? Theraband Level (Shoulder External Rotation) Level 2 (Red)   ? Internal Rotation Strengthening;Both;20 reps;Theraband   ? Theraband Level (Shoulder Internal Rotation) Level 2 (Red)   ? Extension Both;20 reps   ? Theraband Level (Shoulder Extension) Level 2 (Red)   ? Row Both;20 reps   ? Theraband Level (Shoulder Row) Level 2 (Red)   ?  ? Shoulder Exercises: Pulleys  ? Flexion 5 minutes   ?  ? Shoulder Exercises: ROM/Strengthening  ? Nustep L4 x10 min   ? Other ROM/Strengthening Exercises Ball on wall into flexion x20 reps, CW and CCW circles BUE 5x fatigue   ? ?  ?  ? ?  ? ? ? ? ? ? ? ? ? ? ? ? ? ? ? PT Long Term Goals - 01/23/22 1846   ? ?  ? PT LONG TERM GOAL #1  ? Title Patient will be independent with his HEP.   ? Time 6   ? Period Weeks   ? Status New   ? Target Date 03/06/22   ?  ? PT LONG TERM  GOAL #2  ? Title Patient will be able to demonstrate at least 120 degrees of shoulder flexion bilaterally for improved function reaching overhead.   ? Time 6   ? Period Weeks   ? Status New   ? Target Date 03/06/22   ?  ? PT LONG TERM GOAL #3  ? Title Patient will be able to return to playing golf without being limited by his familiar hip pain for improved function with his hobbies.   ? Time 6   ? Period Weeks   ? Status New   ? Target Date 03/06/22   ?  ? PT LONG TERM GOAL #4  ? Title Patient will be able to lift at least 3 pounds overhead for improved function lifting his household appliances overhead.   ? Time 6   ? Period Weeks   ? Status New   ? Target Date 03/06/22   ? ?  ?  ? ?  ? ? ? ? ? ? ? ? Plan - 02/11/22 1041   ? ? Clinical Impression Statement Patient presented in clinic with reports of improvement regarding shoulder pain. Patient progressed with resistive and ROM exercises with only reports of muscle fatigue with ER. B hip strengthening included today as he reports more limitations with hips as of recently. Patient indicated fatigue following end of treatment but no pain.   ? Personal Factors and Comorbidities Other;Time since onset of injury/illness/exacerbation;Comorbidity 1   ? Comorbidities DM   ? Examination-Activity Limitations Reach Overhead;Sit;Sleep   ? Examination-Participation Restrictions Occupation;Cleaning;Community Activity;[employer] Work   ? Stability/Clinical Decision Making Evolving/Moderate complexity   ? Rehab Potential Good   ? PT Frequency 1x / week   ? PT Duration 6 weeks   ? PT Treatment/Interventions ADLs/Self Care Home Management;Cryotherapy;Electrical Stimulation;Moist Heat;Stair training;Functional mobility training;Therapeutic activities;Therapeutic exercise;Neuromuscular re-education;Manual techniques;Patient/family education;Passive range of motion;Dry needling;Vasopneumatic Device   ? PT Next Visit Plan nustep/pulleys, (right shoulder is primary limitation at evaluation)    Assess HEP   ? Consulted and Agree with Plan of Care Patient   ? ?  ?  ? ?  ? ? ?Patient will benefit from skilled therapeutic intervention in order to improve the following deficits and impairments:  Decreased range of motion, Difficulty walking, Impaired UE functional use, Decreased activity tolerance, Pain,  Hypomobility, Decreased strength, Decreased mobility ? ?Visit Diagnosis: ?Chronic right shoulder pain ? ?Chronic left shoulder pain ? ?Stiffness of right shoulder, not elsewhere classified ? ?Stiffness of left shoulder, not elsewhere classified ? ?Pain in left hip ? ?Pain in right hip ? ? ? ? ?Problem List ?Patient Active Problem List  ? Diagnosis Date Noted  ? Dyslipidemia 09/13/2020  ? 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 Hypothyroid 04/05/2019  ? Easy bruising 04/19/2018  ? BPH (benign prostatic hyperplasia) 02/01/2018  ? Bradycardia 12/31/2017  ? Myasthenia gravis with acute exacerbation ([place]) 12/02/2017  ? Tubular adenoma 11/02/2017  ? Slurred speech 10/27/2017  ? OBSTRUCTIVE SLEEP APNEA 08/14/2010  ? GERD 12/12/2009  ? Type 2 diabetes mellitus with hyperglycemia ([place]) 12/12/2009  ? TRANSAMINASES, SERUM, ELEVATED 12/12/2009  ? ? ?Standley Brooking, PTA ?02/11/2022, 12:15 PM ? ?[place] ?[facility] ?[place] ?[place], [place], [address] ?Phone: [phone]   Fax:  [phone] ? ?Name: Rodney Martinez ?MRN: [id number] ?Date of Birth: Dec 22, 1950 ? ? ? ?

## 2022-02-12 DIAGNOSIS — M5411 Radiculopathy, occipito-atlanto-axial region: Secondary | ICD-10-CM | POA: Diagnosis not present

## 2022-02-12 DIAGNOSIS — M9901 Segmental and somatic dysfunction of cervical region: Secondary | ICD-10-CM | POA: Diagnosis not present

## 2022-02-18 ENCOUNTER — Ambulatory Visit: Payer: Medicare Other

## 2022-02-18 DIAGNOSIS — M25552 Pain in left hip: Secondary | ICD-10-CM

## 2022-02-18 DIAGNOSIS — M25551 Pain in right hip: Secondary | ICD-10-CM | POA: Diagnosis not present

## 2022-02-18 DIAGNOSIS — M25512 Pain in left shoulder: Secondary | ICD-10-CM | POA: Diagnosis not present

## 2022-02-18 DIAGNOSIS — M25611 Stiffness of right shoulder, not elsewhere classified: Secondary | ICD-10-CM | POA: Diagnosis not present

## 2022-02-18 DIAGNOSIS — M25612 Stiffness of left shoulder, not elsewhere classified: Secondary | ICD-10-CM

## 2022-02-18 DIAGNOSIS — M25511 Pain in right shoulder: Secondary | ICD-10-CM | POA: Diagnosis not present

## 2022-02-18 DIAGNOSIS — G8929 Other chronic pain: Secondary | ICD-10-CM | POA: Diagnosis not present

## 2022-02-18 NOTE — Therapy (Signed)
[place] ?[facility] ?[address] ?[place], [place], [address] ?Phone: [phone]   Fax:  [phone] ? ?Physical Therapy Treatment ? ?Patient Details  ?Name: Rodney Martinez ?MRN: [id number] ?Date of Birth: [birth year] ?Referring Provider (PT): Georgina Snell, MD ? ? ?Encounter Date: 02/18/2022 ? ? PT End of Session - 02/18/22 0959   ? ? Visit Number 5   ? Number of Visits 6   ? Date for PT Re-Evaluation 03/07/22   ? PT Start Time [phone]   Patient arrived late to his appointment  ? PT Stop Time 1030   ? PT Time Calculation (min) 33 min   ? Activity Tolerance Patient tolerated treatment well   ? Behavior During Therapy [facility] for tasks assessed/performed   ? ?  ?  ? ?  ? ? ?Past Medical History:  ?Diagnosis Date  ? Cataract 2020, 2021  ? Both eyes  ? Diabetes mellitus without complication ([facility])   ? GERD 12/12/2009  ? HYPERGLYCEMIA 12/12/2009  ? Myasthenia gravis ([place])   ? OBSTRUCTIVE SLEEP APNEA 08/14/2010  ? 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 left upper back - tx p bx  ? 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 left upper outer eyelid - CX3 + 5FU  ? Sleep apnea   ? uses CPAP   ? Thyroid disease   ? TRANSAMINASES, SERUM, ELEVATED 12/12/2009  ? ? ?Past Surgical History:  ?Procedure Laterality Date  ? BASAL CELL CARCINOMA EXCISION  2007  ? face (Mohs)  ? CATARACT EXTRACTION Right 08/2019  ? COLONOSCOPY  04/15/2012  ? HERNIA REPAIR  0263  ? umbilical  ? POLYPECTOMY    ? ? ?There were no vitals filed for this visit. ? ? Subjective Assessment - 02/18/22 0956   ? ? Subjective Patient reports that his shoulders are better, but his left hip was bothering him while playing golf and getting up in the morning   ? Pertinent History platar fasciatis   ? Limitations Standing;House hold activities;Walking   ? How long can you stand comfortably? 10-15 minutes   ? Patient Stated Goals improved strength, improved ability reaching overhead, and improved ability to play golf   ? Currently in Pain? No/denies    ? ?  ?  ? ?  ? ? ? ? ? ? ? ? ? ? ? ? ? ? ? ? ? ? ? ? [place] Adult PT Treatment/Exercise - 02/18/22 0001   ? ?  ? Knee/Hip Exercises: Standing  ? Hip Abduction Stengthening;Both;Knee straight;Limitations   2 minutes  ? Abduction Limitations red theraband   ? Rocker Board 4 minutes   ?  ? Knee/Hip Exercises: Seated  ? Clamshell with TheraBand Red   2 minutes  ? Sit to Sand 20 reps;with UE support   red t-band around knees  ?  ? Shoulder Exercises: Standing  ? Extension Both   2 minutes  ? Extension Weight (lbs) Blue XTS   ?  ? Shoulder Exercises: ROM/Strengthening  ? Nustep L6 x 13 minutes   ? Other ROM/Strengthening Exercises Wall ladder   10 reps  ? ?  ?  ? ?  ? ? ? ? ? ? ? ? ? ? ? ? ? ? ? PT Long Term Goals - 01/23/22 1846   ? ?  ? PT LONG TERM GOAL #1  ? Title Patient will be independent with his HEP.   ? Time 6   ? Period Weeks   ? Status New   ?  Target Date 03/06/22   ?  ? PT LONG TERM GOAL #2  ? Title Patient will be able to demonstrate at least 120 degrees of shoulder flexion bilaterally for improved function reaching overhead.   ? Time 6   ? Period Weeks   ? Status New   ? Target Date 03/06/22   ?  ? PT LONG TERM GOAL #3  ? Title Patient will be able to return to playing golf without being limited by his familiar hip pain for improved function with his hobbies.   ? Time 6   ? Period Weeks   ? Status New   ? Target Date 03/06/22   ?  ? PT LONG TERM GOAL #4  ? Title Patient will be able to lift at least 3 pounds overhead for improved function lifting his household appliances overhead.   ? Time 6   ? Period Weeks   ? Status New   ? Target Date 03/06/22   ? ?  ?  ? ?  ? ? ? ? ? ? ? ? Plan - 02/18/22 1000   ? ? Clinical Impression Statement Treatment focused on his hip pain as this was his primary limitation since his last appointment. He required minimal cueing with today's interventions for proper exercise performance. He reported no increase in pain or discomfort with any of today's interventions. He reported  feeling ok upon the conclusion of treatment. He continues to require skilled physical therapy to address his remaining impairments to return to her prior level of function.   ? Personal Factors and Comorbidities Other;Time since onset of injury/illness/exacerbation;Comorbidity 1   ? Comorbidities DM   ? Examination-Activity Limitations Reach Overhead;Sit;Sleep   ? Examination-Participation Restrictions Occupation;Cleaning;Community Activity;[employer] Work   ? Stability/Clinical Decision Making Evolving/Moderate complexity   ? Rehab Potential Good   ? PT Frequency 1x / week   ? PT Duration 6 weeks   ? PT Treatment/Interventions ADLs/Self Care Home Management;Cryotherapy;Electrical Stimulation;Moist Heat;Stair training;Functional mobility training;Therapeutic activities;Therapeutic exercise;Neuromuscular re-education;Manual techniques;Patient/family education;Passive range of motion;Dry needling;Vasopneumatic Device   ? PT Next Visit Plan nustep/pulleys, (right shoulder is primary limitation at evaluation)   Assess HEP   ? Consulted and Agree with Plan of Care Patient   ? ?  ?  ? ?  ? ? ?Patient will benefit from skilled therapeutic intervention in order to improve the following deficits and impairments:  Decreased range of motion, Difficulty walking, Impaired UE functional use, Decreased activity tolerance, Pain, Hypomobility, Decreased strength, Decreased mobility ? ?Visit Diagnosis: ?Chronic right shoulder pain ? ?Chronic left shoulder pain ? ?Stiffness of right shoulder, not elsewhere classified ? ?Stiffness of left shoulder, not elsewhere classified ? ?Pain in left hip ? ?Pain in right hip ? ? ? ? ?Problem List ?Patient Active Problem List  ? Diagnosis Date Noted  ? Dyslipidemia 09/13/2020  ? 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 Hypothyroid 04/05/2019  ? Easy bruising 04/19/2018  ? BPH (benign prostatic hyperplasia) 02/01/2018  ? Bradycardia 12/31/2017  ? Myasthenia gravis with acute exacerbation ([place])  12/02/2017  ? Tubular adenoma 11/02/2017  ? Slurred speech 10/27/2017  ? OBSTRUCTIVE SLEEP APNEA 08/14/2010  ? GERD 12/12/2009  ? Type 2 diabetes mellitus with hyperglycemia ([place]) 12/12/2009  ? TRANSAMINASES, SERUM, ELEVATED 12/12/2009  ? ? ?Darlin Coco, PT ?02/18/2022, 11:26 AM ? ?[place] ?[facility] ?[place] ?[place], [place], [address] ?Phone: [phone]   Fax:  [phone] ? ?Name: Rodney Martinez ?MRN: [id number] ?Date of Birth: June 24, 1951 ? ? ? ?

## 2022-02-25 ENCOUNTER — Ambulatory Visit: Payer: Medicare Other

## 2022-02-25 DIAGNOSIS — M25612 Stiffness of left shoulder, not elsewhere classified: Secondary | ICD-10-CM

## 2022-02-25 DIAGNOSIS — M25552 Pain in left hip: Secondary | ICD-10-CM | POA: Diagnosis not present

## 2022-02-25 DIAGNOSIS — M25511 Pain in right shoulder: Secondary | ICD-10-CM | POA: Diagnosis not present

## 2022-02-25 DIAGNOSIS — G8929 Other chronic pain: Secondary | ICD-10-CM

## 2022-02-25 DIAGNOSIS — M25611 Stiffness of right shoulder, not elsewhere classified: Secondary | ICD-10-CM | POA: Diagnosis not present

## 2022-02-25 DIAGNOSIS — M25551 Pain in right hip: Secondary | ICD-10-CM

## 2022-02-25 DIAGNOSIS — M25512 Pain in left shoulder: Secondary | ICD-10-CM | POA: Diagnosis not present

## 2022-02-25 NOTE — Therapy (Signed)
[facility] [place], [place], [address] Phone: [phone]   Fax:  [phone]  Physical Therapy Treatment  Patient Details  Name: Rodney Martinez MRN: [id number] Date of Birth: 1951/05/08 Referring Provider (PT): Georgina Snell, MD   Encounter Date: 02/25/2022   PT End of Session - 02/25/22 0953     Visit Number 6    Number of Visits 9    Date for PT Re-Evaluation 04/04/22    PT Start Time 2244    PT Stop Time 1030    PT Time Calculation (min) 40 min    Activity Tolerance Patient tolerated treatment well    Behavior During Therapy [facility] for tasks assessed/performed             Past Medical History:  Diagnosis Date   Cataract 2020, 2021   Both eyes   Diabetes mellitus without complication ([place])    GERD 12/12/2009   HYPERGLYCEMIA 12/12/2009   Myasthenia gravis ([place])    OBSTRUCTIVE SLEEP APNEA 08/14/2010   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   left upper back - tx p bx   SCC (squamous cell carcinoma) 09/22/2018   left upper outer eyelid - CX3 + 5FU   Sleep apnea    uses CPAP    Thyroid disease    TRANSAMINASES, SERUM, ELEVATED 12/12/2009    Past Surgical History:  Procedure Laterality Date   BASAL CELL CARCINOMA EXCISION  2007   face (Mohs)   CATARACT EXTRACTION Right 08/2019   COLONOSCOPY  04/15/2012   HERNIA REPAIR  [id number]   umbilical   POLYPECTOMY      There were no vitals filed for this visit.   Subjective Assessment - 02/25/22 0951     Subjective Patient reports that his shoulders are better, but his left hip is still bothering him.    Pertinent History platar fasciatis    Limitations Standing;House hold activities;Walking    How long can you stand comfortably? 10-15 minutes    Patient Stated Goals improved strength, improved ability reaching overhead, and improved ability to play golf    Currently in Pain? Yes    Pain Score 3     Pain Location Hip    Pain Orientation Left                                [facility] Adult PT Treatment/Exercise - 02/25/22 0001       Knee/Hip Exercises: Standing   Heel Raises Both;20 reps    Hip Flexion Both;20 reps;Knee straight    Hip Flexion Limitations green t-band around ankles    Hip Abduction Both;20 reps;Knee straight    Abduction Limitations green t-band around ankles    Hip Extension Both;20 reps;Knee straight    Extension Limitations green t-band around ankles      Knee/Hip Exercises: Seated   Clamshell with TheraBand Green   2 minutes   Abduction/Adduction  Both;20 reps   green t-band around knees     Shoulder Exercises: ROM/Strengthening   Nustep L4 x 15 minutes                     PT Education - 02/25/22 1052     Education Details importance of his HEP, progress with therapy, POC    Person(s) Educated Patient    Methods Explanation    Comprehension Verbalized understanding  PT Long Term Goals - 02/25/22 1008       PT LONG TERM GOAL #1   Title Patient will be independent with his HEP.    Baseline patient reports that he is doing his HEP every other day    Time 6    Period Weeks    Status Partially Met    Target Date 03/06/22      PT LONG TERM GOAL #2   Title Patient will be able to demonstrate at least 120 degrees of shoulder flexion bilaterally for improved function reaching overhead.    Baseline R: 129 L:125 "strain" with both    Time 6    Period Weeks    Status Achieved    Target Date 03/06/22      PT LONG TERM GOAL #3   Title Patient will be able to return to playing golf without being limited by his familiar hip pain for improved function with his hobbies.    Time 6    Period Weeks    Status Achieved    Target Date 03/06/22      PT LONG TERM GOAL #4   Title Patient will be able to lift at least 3 pounds overhead for improved function lifting his household appliances overhead.    Baseline slight pain, but better    Time 6    Period Weeks     Status Partially Met    Target Date 03/06/22                   Plan - 02/25/22 0953     Clinical Impression Statement Patient has made good progress with skilled physical therapy as evidenced by his subjective reports and progress toward his goals. His primary limitation remains his hip pain which resulted in today's treatment focusing on his hip. He required minimal cueing with today's new interventions for proper biomechanics to facilitate hip stability. He experienced no significant pain or discomfort with any of today's interventions. He reported that his hips felt better upon the conclusion of treatment. Recommend that he continue with his updated plan of care to address his remaining impairments to return to his prior level of function.    Personal Factors and Comorbidities Other;Time since onset of injury/illness/exacerbation;Comorbidity 1    Comorbidities DM    Examination-Activity Limitations Reach Overhead;Sit;Sleep    Examination-Participation Restrictions Occupation;Cleaning;Community Activity;Yard Work    [employer] Evolving/Moderate complexity    Rehab Potential Good    PT Frequency 1x / week    PT Duration 6 weeks    PT Treatment/Interventions ADLs/Self Care Home Management;Cryotherapy;Electrical Stimulation;Moist Heat;Stair training;Functional mobility training;Therapeutic activities;Therapeutic exercise;Neuromuscular re-education;Manual techniques;Patient/family education;Passive range of motion;Dry needling;Vasopneumatic Device    PT Next Visit Plan nustep/pulleys, (right shoulder is primary limitation at evaluation)   Assess HEP    Consulted and Agree with Plan of Care Patient             Patient will benefit from skilled therapeutic intervention in order to improve the following deficits and impairments:  Decreased range of motion, Difficulty walking, Impaired UE functional use, Decreased activity tolerance, Pain, Hypomobility, Decreased  strength, Decreased mobility  Visit Diagnosis: Chronic right shoulder pain  Chronic left shoulder pain  Stiffness of right shoulder, not elsewhere classified  Stiffness of left shoulder, not elsewhere classified  Pain in left hip  Pain in right hip     Problem List Patient Active Problem List   Diagnosis Date Noted   Dyslipidemia 09/13/2020   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   Hypothyroid 04/05/2019   Easy bruising 04/19/2018   BPH (benign prostatic hyperplasia) 02/01/2018   Bradycardia 12/31/2017   Myasthenia gravis with acute exacerbation ([place]) 12/02/2017   Tubular adenoma 11/02/2017   Slurred speech 10/27/2017   OBSTRUCTIVE SLEEP APNEA 08/14/2010   GERD 12/12/2009   Type 2 diabetes mellitus with hyperglycemia ([place]) 12/12/2009   TRANSAMINASES, SERUM, ELEVATED 12/12/2009    Darlin Coco, PT 02/25/2022, 10:55 AM  [facility] [place], [place], [address] Phone: [phone]   Fax:  [phone]  Name: Rodney Martinez MRN: [id number] Date of Birth: 04-10-1951  Progress Note Reporting Period 01/23/22 to 02/25/22  See note below for Objective Data and Assessment of Progress/Goals.   See clinical impression statement

## 2022-02-26 DIAGNOSIS — M9901 Segmental and somatic dysfunction of cervical region: Secondary | ICD-10-CM | POA: Diagnosis not present

## 2022-02-26 DIAGNOSIS — M5411 Radiculopathy, occipito-atlanto-axial region: Secondary | ICD-10-CM | POA: Diagnosis not present

## 2022-03-07 ENCOUNTER — Ambulatory Visit: Payer: Medicare Other | Attending: Family Medicine

## 2022-03-07 DIAGNOSIS — M25611 Stiffness of right shoulder, not elsewhere classified: Secondary | ICD-10-CM | POA: Diagnosis not present

## 2022-03-07 DIAGNOSIS — M25612 Stiffness of left shoulder, not elsewhere classified: Secondary | ICD-10-CM | POA: Insufficient documentation

## 2022-03-07 DIAGNOSIS — M25552 Pain in left hip: Secondary | ICD-10-CM | POA: Diagnosis not present

## 2022-03-07 DIAGNOSIS — M25511 Pain in right shoulder: Secondary | ICD-10-CM | POA: Insufficient documentation

## 2022-03-07 DIAGNOSIS — G8929 Other chronic pain: Secondary | ICD-10-CM | POA: Insufficient documentation

## 2022-03-07 DIAGNOSIS — M25512 Pain in left shoulder: Secondary | ICD-10-CM | POA: Diagnosis not present

## 2022-03-07 DIAGNOSIS — M25551 Pain in right hip: Secondary | ICD-10-CM | POA: Insufficient documentation

## 2022-03-07 NOTE — Therapy (Signed)
[facility] [place], [place], [address] Phone: [phone]   Fax:  [phone]  Physical Therapy Treatment  Patient Details  Name: Rodney Martinez MRN: [id number] Date of Birth: [birth year] Referring Provider (PT): Georgina Snell, MD   Encounter Date: 03/07/2022   PT End of Session - 03/07/22 0951     Visit Number 7    Number of Visits 9    Date for PT Re-Evaluation 04/04/22    PT Start Time 2197    PT Stop Time 1027    PT Time Calculation (min) 40 min    Activity Tolerance Patient tolerated treatment well    Behavior During Therapy [facility] for tasks assessed/performed             Past Medical History:  Diagnosis Date   Cataract 2020, 2021   Both eyes   Diabetes mellitus without complication ([place])    GERD 12/12/2009   HYPERGLYCEMIA 12/12/2009   Myasthenia gravis ([place])    OBSTRUCTIVE SLEEP APNEA 08/14/2010   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   left upper back - tx p bx   SCC (squamous cell carcinoma) 09/22/2018   left upper outer eyelid - CX3 + 5FU   Sleep apnea    uses CPAP    Thyroid disease    TRANSAMINASES, SERUM, ELEVATED 12/12/2009    Past Surgical History:  Procedure Laterality Date   BASAL CELL CARCINOMA EXCISION  2007   face (Mohs)   CATARACT EXTRACTION Right 08/2019   COLONOSCOPY  04/15/2012   HERNIA REPAIR  [id number]   umbilical   POLYPECTOMY      There were no vitals filed for this visit.   Subjective Assessment - 03/07/22 0951     Subjective Pt arrives for today's treatment session reporting 2/10 left hip pain with ambulation.    Pertinent History platar fasciatis    Limitations Standing;House hold activities;Walking    How long can you stand comfortably? 10-15 minutes    Patient Stated Goals improved strength, improved ability reaching overhead, and improved ability to play golf    Currently in Pain? Yes    Pain Score 2     Pain Location Hip    Pain Orientation Left                                [facility] Adult PT Treatment/Exercise - 03/07/22 0001       Knee/Hip Exercises: Machines for Strengthening   Cybex Knee Extension 10# x 20 reps    Cybex Knee Flexion 40# x 20 reps      Knee/Hip Exercises: Standing   Heel Raises Both;20 reps    Heel Raises Limitations Toe Raises x 20 reps    Hip Flexion Both;20 reps;Knee straight    Hip Flexion Limitations green t-band around ankles    Hip Abduction Both;20 reps;Knee straight    Abduction Limitations green t-band around ankles    Hip Extension Both;20 reps;Knee straight    Extension Limitations green t-band around ankles    Rocker Board 4 minutes      Knee/Hip Exercises: Seated   Long Arc Quad Strengthening;Both;Weights;20 reps    Long Arc Quad Weight 4 lbs.    Clamshell with TheraBand Green   3 mins   Marching Strengthening;Both;20 reps;Weights    Marching Limitations 4      Shoulder Exercises: ROM/Strengthening   Nustep Lvl 4 x 15 mins  PT Long Term Goals - 02/25/22 1008       PT LONG TERM GOAL #1   Title Patient will be independent with his HEP.    Baseline patient reports that he is doing his HEP every other day    Time 6    Period Weeks    Status Partially Met    Target Date 03/06/22      PT LONG TERM GOAL #2   Title Patient will be able to demonstrate at least 120 degrees of shoulder flexion bilaterally for improved function reaching overhead.    Baseline R: 129 L:125 "strain" with both    Time 6    Period Weeks    Status Achieved    Target Date 03/06/22      PT LONG TERM GOAL #3   Title Patient will be able to return to playing golf without being limited by his familiar hip pain for improved function with his hobbies.    Time 6    Period Weeks    Status Achieved    Target Date 03/06/22      PT LONG TERM GOAL #4   Title Patient will be able to lift at least 3 pounds overhead for improved function lifting his household appliances  overhead.    Baseline slight pain, but better    Time 6    Period Weeks    Status Partially Met    Target Date 03/06/22                   Plan - 03/07/22 0953     Clinical Impression Statement Pt arrives for today's treatment session reporting 2/10 L hip pain.  Pt requiring min cues during standing resisted hip exercises for proper technique and posture with avoidance of compensatory leaning.  Pt introducted to cybex knee flexion and extension without issue or complaint; min cues required for eccentric control.  Pt reports decrease in left hip pain upon completion of today's treatment session.    Personal Factors and Comorbidities Other;Time since onset of injury/illness/exacerbation;Comorbidity 1    Comorbidities DM    Examination-Activity Limitations Reach Overhead;Sit;Sleep    Examination-Participation Restrictions Occupation;Cleaning;Community Activity;Yard Work    [employer] officer Evolving/Moderate complexity    Rehab Potential Good    PT Frequency 1x / week    PT Duration 6 weeks    PT Treatment/Interventions ADLs/Self Care Home Management;Cryotherapy;Electrical Stimulation;Moist Heat;Stair training;Functional mobility training;Therapeutic activities;Therapeutic exercise;Neuromuscular re-education;Manual techniques;Patient/family education;Passive range of motion;Dry needling;Vasopneumatic Device    PT Next Visit Plan nustep/pulleys, (right shoulder is primary limitation at evaluation)   Assess HEP    Consulted and Agree with Plan of Care Patient             Patient will benefit from skilled therapeutic intervention in order to improve the following deficits and impairments:  Decreased range of motion, Difficulty walking, Impaired UE functional use, Decreased activity tolerance, Pain, Hypomobility, Decreased strength, Decreased mobility  Visit Diagnosis: Chronic right shoulder pain  Chronic left shoulder pain  Stiffness of right shoulder, not  elsewhere classified  Stiffness of left shoulder, not elsewhere classified  Pain in left hip  Pain in right hip     Problem List Patient Active Problem List   Diagnosis Date Noted   Dyslipidemia 09/13/2020   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   Hypothyroid 04/05/2019   Easy bruising 04/19/2018   BPH (benign prostatic hyperplasia) 02/01/2018   Bradycardia 12/31/2017   Myasthenia gravis with acute exacerbation ([place]) 12/02/2017  Tubular adenoma 11/02/2017   Slurred speech 10/27/2017   OBSTRUCTIVE SLEEP APNEA 08/14/2010   GERD 12/12/2009   Type 2 diabetes mellitus with hyperglycemia ([place]) 12/12/2009   TRANSAMINASES, SERUM, ELEVATED 12/12/2009   Rationale for Evaluation and Treatment Rehabilitation  Kathrynn Ducking, PTA 03/07/2022, 10:27 AM  [facility] [place], [place], [address] Phone: [phone]   Fax:  [phone]  Name: Rodney Martinez MRN: [id number] Date of Birth: 18-Jun-1951

## 2022-03-10 ENCOUNTER — Ambulatory Visit: Payer: Medicare Other | Admitting: Physical Therapy

## 2022-03-10 ENCOUNTER — Encounter: Payer: Self-pay | Admitting: Physical Therapy

## 2022-03-10 DIAGNOSIS — M25552 Pain in left hip: Secondary | ICD-10-CM | POA: Diagnosis not present

## 2022-03-10 DIAGNOSIS — M25612 Stiffness of left shoulder, not elsewhere classified: Secondary | ICD-10-CM | POA: Diagnosis not present

## 2022-03-10 DIAGNOSIS — G8929 Other chronic pain: Secondary | ICD-10-CM | POA: Diagnosis not present

## 2022-03-10 DIAGNOSIS — M25512 Pain in left shoulder: Secondary | ICD-10-CM | POA: Diagnosis not present

## 2022-03-10 DIAGNOSIS — M25511 Pain in right shoulder: Secondary | ICD-10-CM | POA: Diagnosis not present

## 2022-03-10 DIAGNOSIS — M25611 Stiffness of right shoulder, not elsewhere classified: Secondary | ICD-10-CM

## 2022-03-10 DIAGNOSIS — M25551 Pain in right hip: Secondary | ICD-10-CM

## 2022-03-10 NOTE — Therapy (Signed)
[facility] [place], [place], [address] Phone: [phone]   Fax:  [phone]  Physical Therapy Treatment  Patient Details  Name: Rodney Martinez MRN: [id number] Date of Birth: 06-14-1951 Referring Provider (PT): Georgina Snell, MD   Encounter Date: 03/10/2022   PT End of Session - 03/10/22 0907     Visit Number 8    Number of Visits 9    Date for PT Re-Evaluation 04/04/22    PT Start Time 0902    PT Stop Time 2774    PT Time Calculation (min) 42 min    Activity Tolerance Patient tolerated treatment well    Behavior During Therapy [facility] for tasks assessed/performed             Past Medical History:  Diagnosis Date   Cataract 2020, 2021   Both eyes   Diabetes mellitus without complication ([place])    GERD 12/12/2009   HYPERGLYCEMIA 12/12/2009   Myasthenia gravis ([place])    OBSTRUCTIVE SLEEP APNEA 08/14/2010   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   left upper back - tx p bx   SCC (squamous cell carcinoma) 09/22/2018   left upper outer eyelid - CX3 + 5FU   Sleep apnea    uses CPAP    Thyroid disease    TRANSAMINASES, SERUM, ELEVATED 12/12/2009    Past Surgical History:  Procedure Laterality Date   BASAL CELL CARCINOMA EXCISION  2007   face (Mohs)   CATARACT EXTRACTION Right 08/2019   COLONOSCOPY  04/15/2012   HERNIA REPAIR  [id number]   umbilical   POLYPECTOMY      There were no vitals filed for this visit.   Subjective Assessment - 03/10/22 0906     Subjective Reports some discomfort in his hips but shoulders are better. More L hip pain today.    Pertinent History platar fasciatis    Limitations Standing;House hold activities;Walking    How long can you stand comfortably? 10-15 minutes    Patient Stated Goals improved strength, improved ability reaching overhead, and improved ability to play golf    Currently in Pain? Yes    Pain Score 3     Pain Location Hip    Pain Orientation Left    Pain Descriptors /  Indicators Discomfort    Pain Type Chronic pain    Pain Onset More than a month ago    Pain Frequency Intermittent                [facility] PT Assessment - 03/10/22 0001       Assessment   Medical Diagnosis Bilateral shoulder and hip pain    Referring Provider (PT) Georgina Snell, MD    Hand Dominance Right    Next MD Visit 03/18/22    Prior Therapy No      Precautions   Precautions None                           [facility] Adult PT Treatment/Exercise - 03/10/22 0001       Knee/Hip Exercises: Aerobic   Nustep L3 x15 min      Knee/Hip Exercises: Standing   Heel Raises Both;20 reps    Heel Raises Limitations Toe Raises x 20 reps    Hip Abduction AROM;Left;20 reps;Knee straight    Hip Extension AROM;Left;20 reps;Knee bent    Lateral Step Up Both;20 reps;Hand Hold: 2;Step Height: 6"    Forward Step Up Both;20 reps;Hand Hold: 2;Step  Height: 6"      Shoulder Exercises: ROM/Strengthening   UBE (Upper Arm Bike) 120 RPM x6 min (forward/backward)    Wall Pushups 20 reps                          PT Long Term Goals - 02/25/22 1008       PT LONG TERM GOAL #1   Title Patient will be independent with his HEP.    Baseline patient reports that he is doing his HEP every other day    Time 6    Period Weeks    Status Partially Met    Target Date 03/06/22      PT LONG TERM GOAL #2   Title Patient will be able to demonstrate at least 120 degrees of shoulder flexion bilaterally for improved function reaching overhead.    Baseline R: 129 L:125 "strain" with both    Time 6    Period Weeks    Status Achieved    Target Date 03/06/22      PT LONG TERM GOAL #3   Title Patient will be able to return to playing golf without being limited by his familiar hip pain for improved function with his hobbies.    Time 6    Period Weeks    Status Achieved    Target Date 03/06/22      PT LONG TERM GOAL #4   Title Patient will be able to lift at least 3 pounds overhead for  improved function lifting his household appliances overhead.    Baseline slight pain, but better    Time 6    Period Weeks    Status Partially Met    Target Date 03/06/22                   Plan - 03/10/22 0945     Clinical Impression Statement Patient presented in clinic with only reports of L hip discomfort. Patient progressed throughout therex for more hip strengthening. Patient overall fatigued following therex session and discomfort temporarily of wrist during pushups.    Personal Factors and Comorbidities Other;Time since onset of injury/illness/exacerbation;Comorbidity 1    Comorbidities DM    Examination-Activity Limitations Reach Overhead;Sit;Sleep    Examination-Participation Restrictions Occupation;Cleaning;Community Activity;Yard Work    [employer] officer Evolving/Moderate complexity    Rehab Potential Good    PT Frequency 1x / week    PT Duration 6 weeks    PT Treatment/Interventions ADLs/Self Care Home Management;Cryotherapy;Electrical Stimulation;Moist Heat;Stair training;Functional mobility training;Therapeutic activities;Therapeutic exercise;Neuromuscular re-education;Manual techniques;Patient/family education;Passive range of motion;Dry needling;Vasopneumatic Device    PT Next Visit Plan nustep/pulleys, (right shoulder is primary limitation at evaluation)   Assess HEP    Consulted and Agree with Plan of Care Patient             Patient will benefit from skilled therapeutic intervention in order to improve the following deficits and impairments:  Decreased range of motion, Difficulty walking, Impaired UE functional use, Decreased activity tolerance, Pain, Hypomobility, Decreased strength, Decreased mobility  Visit Diagnosis: Chronic right shoulder pain  Chronic left shoulder pain  Stiffness of right shoulder, not elsewhere classified  Stiffness of left shoulder, not elsewhere classified  Pain in left hip  Pain in right  hip     Problem List Patient Active Problem List   Diagnosis Date Noted   Dyslipidemia 09/13/2020   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   Hypothyroid 04/05/2019   Easy bruising 04/19/2018  BPH (benign prostatic hyperplasia) 02/01/2018   Bradycardia 12/31/2017   Myasthenia gravis with acute exacerbation ([place]) 12/02/2017   Tubular adenoma 11/02/2017   Slurred speech 10/27/2017   OBSTRUCTIVE SLEEP APNEA 08/14/2010   GERD 12/12/2009   Type 2 diabetes mellitus with hyperglycemia ([place]) 12/12/2009   TRANSAMINASES, SERUM, ELEVATED 12/12/2009   Rationale for Evaluation and Treatment Rehabilitation  Standley Brooking, PTA 03/10/2022, 9:57 AM  [facility] [place], [place], [address] Phone: [phone]   Fax:  [phone]  Name: Rodney Martinez MRN: [id number] Date of Birth: 01/20/1951

## 2022-03-12 DIAGNOSIS — M5411 Radiculopathy, occipito-atlanto-axial region: Secondary | ICD-10-CM | POA: Diagnosis not present

## 2022-03-12 DIAGNOSIS — M9901 Segmental and somatic dysfunction of cervical region: Secondary | ICD-10-CM | POA: Diagnosis not present

## 2022-03-13 ENCOUNTER — Other Ambulatory Visit: Payer: Self-pay | Admitting: Family Medicine

## 2022-03-17 NOTE — Progress Notes (Signed)
I, Rodney Martinez, LAT, ATC, am serving as scribe for Dr. Lynne Martinez.  Rodney Martinez is a 71 y.o. male who presents to [place] [facility] today for f/u of B hip and B shoulder pain through to be due to [facility] ([place] tendinopathy and greater trochanteric burisitis and tendinopathy.  He was last seen by Dr. Georgina Snell on 01/16/22 and was referred to PT of which he has completed 8 visits.  Today, pt reports that he's feeling better w/ both his hips and shoulders, noting 75% improvement overall.  He states that his L hip is the most noticeable, having some pain w/ walking.  Patient did have a concerning appearance of his left scapula on x-ray and CT scan was ordered but never obtained.  He played phone tag back-and-forth but lost the phone number and never called back.  Diagnostic testing: R and L shoulder XR- 01/16/22; Pelvis XR- 01/08/22  Pertinent review of systems: No fevers or chills  Relevant historical information: History of monoclonal B-cell lymphocytosis.   Exam:  BP 102/66 (BP Location: Right Arm, Patient Position: Sitting, Cuff Size: Large)   Pulse (!) 59   Ht '5\' 10"'$  (1.778 m)   Wt 245 lb 12.8 oz (111.5 kg)   SpO2 94%   BMI 35.27 kg/m  General: Well Developed, well nourished, and in no acute distress.   MSK: Bilateral shoulder motion intact without significant pain. Normal hip motion bilaterally without significant pain.  Normal gait.    Lab and Radiology Results [name] LIMITED JOINT SPACE STRUCTURES UP BILAT(NO LINKED CHARGES)  Result Date: 02/14/2022 Diagnostic Limited MSK Ultrasound of: Right shoulder Biceps tendon intact normal. Subscapularis tendon appears to be intact. Supraspinatus tendon appears to be intact with mild subacromial bursitis present. Infraspinatus tendon appears to be intact. AC joint degenerative with effusion. Impression: Subacromial bursitis and AC DJD  DG Shoulder Right  Result Date: 01/17/2022 CLINICAL DATA:  Bilateral shoulder pain for the  past 2 months, right greater than left. No known injury. EXAM: RIGHT SHOULDER - 2+ VIEW COMPARISON:  Left shoulder radiographs obtained at the same time. Chest CT dated 02/02/2018. FINDINGS: Minimal inferior glenohumeral spur formation. Moderate inferior acromioclavicular spur formation. No areas of bone erosion are seen. IMPRESSION: Minimal glenohumeral and moderate acromioclavicular joint degenerative spur formation. Electronically Signed   By: Claudie Revering M.D.   On: 01/17/2022 09:38   DG Shoulder Left  Result Date: 01/17/2022 CLINICAL DATA:  Bilateral shoulder pain for the past 2 months, right more than left. No known injury. EXAM: LEFT SHOULDER - 2+ VIEW COMPARISON:  Right shoulder obtained at the same time. Chest CT dated 02/02/2018. FINDINGS: Minimal glenoid spur formation. Mild-to-moderate inferior AC joint spur formation. The cortical margins of the inferior medial portions of the scapula are not visualized on the lateral scapular image. This area is obscured by the overlying structures of the chest on the frontal view. The visualized portions of the scapula on the axillary view appear normal. IMPRESSION: 1. Possible bone erosion involving the medial aspect of the inferior scapula. Further evaluation with a chest CT without contrast is recommended. 2. Minimal glenohumeral and mild-to-moderate acromioclavicular joint degenerative changes. Electronically Signed   By: Claudie Revering M.D.   On: 01/17/2022 09:36   DG Pelvis 1-2 Views  Result Date: 01/09/2022 CLINICAL DATA:  Progressive bilateral hip pain for several months. EXAM: PELVIS - 1-2 VIEW COMPARISON:  None. FINDINGS: No acute fracture or hip dislocation is identified. Symmetric, mild superior hip joint space  narrowing and marginal spurring are noted bilaterally. Small calcifications in the pelvis are compatible with phleboliths. IMPRESSION: Mild bilateral hip osteoarthrosis. Electronically Signed   By: Logan Bores M.D.   On: 01/09/2022 10:05     I, Rodney Martinez, personally (independently) visualized and performed the interpretation of the images attached in this note.   Assessment and Plan: 71 y.o. male with bilateral shoulder and hip pain improved significantly with physical therapy.  Plan to continue home exercise program and check back with me as needed.  Resume or refer back to PT if needed in the future or even proceed to injection.  The area of concern for bone lytic change on x-ray left scapula should be addressed with the CT scan that I ordered.  Encourage patient to call and contact [facility] to schedule the CT scan.  Provided Rus with the phone number to call.  He will do so.    Discussed warning signs or symptoms. Please see discharge instructions. Patient expresses understanding.   The above documentation has been reviewed and is accurate and complete Rodney Martinez, M.D.

## 2022-03-18 ENCOUNTER — Ambulatory Visit (INDEPENDENT_AMBULATORY_CARE_PROVIDER_SITE_OTHER): Payer: Medicare Other | Admitting: Family Medicine

## 2022-03-18 ENCOUNTER — Encounter: Payer: Self-pay | Admitting: Family Medicine

## 2022-03-18 VITALS — BP 102/66 | HR 59 | Ht 70.0 in | Wt 245.8 lb

## 2022-03-18 DIAGNOSIS — M8580 Other specified disorders of bone density and structure, unspecified site: Secondary | ICD-10-CM | POA: Diagnosis not present

## 2022-03-18 DIAGNOSIS — G8929 Other chronic pain: Secondary | ICD-10-CM | POA: Diagnosis not present

## 2022-03-18 DIAGNOSIS — M25512 Pain in left shoulder: Secondary | ICD-10-CM

## 2022-03-18 DIAGNOSIS — M25511 Pain in right shoulder: Secondary | ICD-10-CM

## 2022-03-18 DIAGNOSIS — M25552 Pain in left hip: Secondary | ICD-10-CM

## 2022-03-18 DIAGNOSIS — M25551 Pain in right hip: Secondary | ICD-10-CM

## 2022-03-18 NOTE — Patient Instructions (Addendum)
Good to see you today.  Con't w/ PT and you can be finished when your therapist recommends discharge.  Please call [facility] at [phone] to schedule your CT scan.  Follow-up: as needed

## 2022-03-20 ENCOUNTER — Ambulatory Visit: Payer: Medicare Other

## 2022-03-20 DIAGNOSIS — M25551 Pain in right hip: Secondary | ICD-10-CM | POA: Diagnosis not present

## 2022-03-20 DIAGNOSIS — M25611 Stiffness of right shoulder, not elsewhere classified: Secondary | ICD-10-CM

## 2022-03-20 DIAGNOSIS — M25612 Stiffness of left shoulder, not elsewhere classified: Secondary | ICD-10-CM | POA: Diagnosis not present

## 2022-03-20 DIAGNOSIS — M25552 Pain in left hip: Secondary | ICD-10-CM | POA: Diagnosis not present

## 2022-03-20 DIAGNOSIS — M25512 Pain in left shoulder: Secondary | ICD-10-CM | POA: Diagnosis not present

## 2022-03-20 DIAGNOSIS — G8929 Other chronic pain: Secondary | ICD-10-CM | POA: Diagnosis not present

## 2022-03-20 DIAGNOSIS — M25511 Pain in right shoulder: Secondary | ICD-10-CM | POA: Diagnosis not present

## 2022-03-20 NOTE — Therapy (Signed)
[facility] [place], [place], [address] Phone: [phone]   Fax:  [phone]  Physical Therapy Treatment  Patient Details  Name: Rodney Martinez MRN: [id number] Date of Birth: 09-17-1951 Referring Provider (PT): Georgina Snell, MD   Encounter Date: 03/20/2022   PT End of Session - 03/20/22 0951     Visit Number 9    Number of Visits 9    Date for PT Re-Evaluation 04/04/22    PT Start Time 0945    PT Stop Time 1026    PT Time Calculation (min) 41 min    Activity Tolerance Patient tolerated treatment well    Behavior During Therapy [facility] for tasks assessed/performed             Past Medical History:  Diagnosis Date   Cataract 2020, 2021   Both eyes   Diabetes mellitus without complication ([place])    GERD 12/12/2009   HYPERGLYCEMIA 12/12/2009   Myasthenia gravis ([place])    OBSTRUCTIVE SLEEP APNEA 08/14/2010   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   left upper back - tx p bx   SCC (squamous cell carcinoma) 09/22/2018   left upper outer eyelid - CX3 + 5FU   Sleep apnea    uses CPAP    Thyroid disease    TRANSAMINASES, SERUM, ELEVATED 12/12/2009    Past Surgical History:  Procedure Laterality Date   BASAL CELL CARCINOMA EXCISION  2007   face (Mohs)   CATARACT EXTRACTION Right 08/2019   COLONOSCOPY  04/15/2012   HERNIA REPAIR  4801   umbilical   POLYPECTOMY      There were no vitals filed for this visit.   Subjective Assessment - 03/20/22 0950     Subjective Patient reports that he feels like he is 90% better and he will only feel that pain if he sits to lays down for long periods of time.    Pertinent History platar fasciatis    Limitations Standing;House hold activities;Walking    How long can you stand comfortably? 10-15 minutes    Patient Stated Goals improved strength, improved ability reaching overhead, and improved ability to play golf    Currently in Pain? No/denies    Pain Onset More than a month ago                                [facility] Adult PT Treatment/Exercise - 03/20/22 0001       Elbow Exercises   Elbow Flexion Both;20 reps;Standing;Bar weights/barbell   5 lbs each     Knee/Hip Exercises: Aerobic   Nustep L5 x 17 minutes      Knee/Hip Exercises: Standing   Heel Raises Both;20 reps    Heel Raises Limitations Toe Raises x 20 reps    Hip Flexion Both;20 reps;Knee straight    Hip Abduction Both;20 reps;Knee straight    Hip Extension Both;20 reps;Knee straight    Lateral Step Up Both;Hand Hold: 2;Step Height: 6"   3 minutes   Step Down Both;20 reps;Hand Hold: 2;Step Height: 6"      Shoulder Exercises: Standing   Flexion Both;20 reps;Weights    Shoulder Flexion Weight (lbs) 5                          PT Long Term Goals - 03/20/22 0956       PT LONG TERM GOAL #1   Title  Patient will be independent with his HEP.    Baseline --    Time 6    Period Weeks    Status Achieved    Target Date 03/06/22      PT LONG TERM GOAL #2   Title Patient will be able to demonstrate at least 120 degrees of shoulder flexion bilaterally for improved function reaching overhead.    Baseline R: 129 L:125 "strain" with both    Time 6    Period Weeks    Status Achieved    Target Date 03/06/22      PT LONG TERM GOAL #3   Title Patient will be able to return to playing golf without being limited by his familiar hip pain for improved function with his hobbies.    Time 6    Period Weeks    Status Achieved    Target Date 03/06/22      PT LONG TERM GOAL #4   Title Patient will be able to lift at least 3 pounds overhead for improved function lifting his household appliances overhead.    Baseline slight pain, but better    Time 6    Period Weeks    Status Achieved    Target Date 03/06/22                   Plan - 03/20/22 0955     Clinical Impression Statement Patient has made good progress with skilled physical therapy as he has met all of  his goals for physical therapy as he had met all of his goals. His HEP was updated and he was able to properly demonstrate these interventions. He reported feeling comfortable with these interventions. He was educated on the importance of regular exercise and how it can prevent a reoccurance of his familiar pain. He reported feeling comfortable being discharged at this time.    Personal Factors and Comorbidities Other;Time since onset of injury/illness/exacerbation;Comorbidity 1    Comorbidities DM    Examination-Activity Limitations Reach Overhead;Sit;Sleep    Examination-Participation Restrictions Occupation;Cleaning;Community Activity;Yard Work    Conservation officer, historic buildings Evolving/Moderate complexity    Rehab Potential Good    PT Frequency 1x / week    PT Duration 6 weeks    PT Treatment/Interventions ADLs/Self Care Home Management;Cryotherapy;Electrical Stimulation;Moist Heat;Stair training;Functional mobility training;Therapeutic activities;Therapeutic exercise;Neuromuscular re-education;Manual techniques;Patient/family education;Passive range of motion;Dry needling;Vasopneumatic Device    PT Next Visit Plan nustep/pulleys, (right shoulder is primary limitation at evaluation)   Assess HEP    Consulted and Agree with Plan of Care Patient             Patient will benefit from skilled therapeutic intervention in order to improve the following deficits and impairments:  Decreased range of motion, Difficulty walking, Impaired UE functional use, Decreased activity tolerance, Pain, Hypomobility, Decreased strength, Decreased mobility  Visit Diagnosis: Chronic right shoulder pain  Chronic left shoulder pain  Stiffness of right shoulder, not elsewhere classified  Stiffness of left shoulder, not elsewhere classified  Pain in left hip  Pain in right hip     Problem List Patient Active Problem List   Diagnosis Date Noted   Dyslipidemia 09/13/2020   Monoclonal B-cell  lymphocytosis 04/26/2019   Hypothyroid 04/05/2019   Easy bruising 04/19/2018   BPH (benign prostatic hyperplasia) 02/01/2018   Bradycardia 12/31/2017   Myasthenia gravis with acute exacerbation (HCC) 12/02/2017   Tubular adenoma 11/02/2017   Slurred speech 10/27/2017   OBSTRUCTIVE SLEEP APNEA 08/14/2010   GERD 12/12/2009  Type 2 diabetes mellitus with hyperglycemia ([place]) 12/12/2009   TRANSAMINASES, SERUM, ELEVATED 12/12/2009   Rationale for Evaluation and Treatment Rehabilitation   Darlin Coco, PT 03/20/2022, 4:25 PM  [facility] [place], [place], [address] Phone: [phone]   Fax:  [phone]  Name: Rodney Martinez MRN: [id number] Date of Birth: 1951-07-21  PHYSICAL THERAPY DISCHARGE SUMMARY  Visits from Start of Care: 9  Current functional level related to goals / functional outcomes: Patient has met all of his goals for physical therapy and feels comfortable being discharged at this time.    Remaining deficits: None   Education / Equipment: HEP    Patient agrees to discharge. Patient goals were met. Patient is being discharged due to meeting the stated rehab goals.

## 2022-03-25 DIAGNOSIS — M9901 Segmental and somatic dysfunction of cervical region: Secondary | ICD-10-CM | POA: Diagnosis not present

## 2022-03-25 DIAGNOSIS — M5411 Radiculopathy, occipito-atlanto-axial region: Secondary | ICD-10-CM | POA: Diagnosis not present

## 2022-03-28 ENCOUNTER — Other Ambulatory Visit: Payer: Self-pay | Admitting: Family Medicine

## 2022-03-28 ENCOUNTER — Other Ambulatory Visit: Payer: Self-pay | Admitting: Neurology

## 2022-04-15 ENCOUNTER — Ambulatory Visit
Admission: RE | Admit: 2022-04-15 | Discharge: 2022-04-15 | Disposition: A | Discharge status: Home / Self Care | Payer: Medicare Other | Source: Ambulatory Visit | Attending: Family Medicine | Admitting: Family Medicine

## 2022-04-15 DIAGNOSIS — M8580 Other specified disorders of bone density and structure, unspecified site: Secondary | ICD-10-CM

## 2022-04-15 DIAGNOSIS — I700 Atherosclerosis of aorta: Secondary | ICD-10-CM | POA: Diagnosis not present

## 2022-04-18 NOTE — Progress Notes (Signed)
CT scan of the chest specifically to look at that concerning area of the scapula came back normal.  The scapula does not look concerning on the CT scan. CT scan did show some coronary artery disease evidence so keep taking your cholesterol and diabetes medicines etc.

## 2022-04-19 ENCOUNTER — Other Ambulatory Visit: Payer: Self-pay | Admitting: Family Medicine

## 2022-05-06 ENCOUNTER — Telehealth: Payer: Self-pay | Admitting: Family Medicine

## 2022-05-06 NOTE — Telephone Encounter (Signed)
Left message for patient to call back and schedule Medicare Annual Wellness Visit (AWV) either virtually or in office. Left  my Rodney Martinez number [phone]   Last AWV 05/13/21 ; please schedule at anytime with [facility] Nurse Health Advisor 1 or 2

## 2022-05-06 NOTE — Telephone Encounter (Signed)
Pt called back and stated he was not interested in a AWV.

## 2022-05-06 NOTE — Telephone Encounter (Signed)
Noted  

## 2022-05-22 NOTE — Progress Notes (Signed)
Follow-up Visit   Date: 05/23/2319    Rodney Martinez MRN: [id number] DOB: 09/05/1951    Rodney Martinez is a 71 y.o. right-handed Caucasian male with OSA, hypothyroidism, and diabetes mellitus returning to the clinic for follow-up of seropositive myasthenia gravis.  The patient was accompanied to the clinic by self.  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  Very well-controlled on Cellcept and mestinon. By exam and history, he has minimal disease manifestation of trace left ptosis. - 11/2017 presented with generalized weakness (severe) managed with IVIG and prednisone '60mg'$  - 01/2018 Cellcept added - 12/2018 Cellcept increaesd to '750mg'$  BID - 02/2019 tapered off IVIG - 11/2021 tapered off predisone  Continue Cellcept '750mg'$  BID OK to use mestinon '60mg'$  twice daily as needed  Return to clinic in 9 months  -------------------------------------------------------------------- History of present illness: Starting in mid January 2018, he developed slurred speech, difficulty swallowing, droopy eyelids, and weakness with moving his tongue. MRI brain was negative.  There was high clinical suspicion for myasthenia so he was started on mestinon '30mg'$  three times daily; in the interim, AChR antibodies returned positive.  He saw my colleague, Dr. Tomi Likens initially who started him on prednisone '20mg'$  and increased mestinon to '60mg'$  three times daily and is here to establish care with me. Due to lack of improvement, his prednisone was titrated to '60mg'$  daily and IVIG was started in March 2018. Within a month, his dysphagia and double vision improved.  In May, slow prednisone taper was started.  He was noted to have low platelets (127) and referred to hematology for evaluation, especially since he was also started on Cellcept.  By September he was down to prednisone '20mg'$  and getting IVIG ever 3 weeks. Over 2019-2020, his IVIG has been tapered to every 8 weeks and  eventually stopped in May 2020.  His Cellcept was increased to '750mg'$  BID in March 2020.  He continues a slow prednisone taper.   UPDATE 02/08/2021:  Here is here for follow-up visit.  He has being doing great on prednisone '5mg'$  daily and denies any breakthrough weakness.  He remains on Cellcept '750mg'$  BID and mestinon '60mg'$  BID (9a and 5p).  No problems with speech/swallowing, double vision, or limb weakness.   UPDATE 05/17/2021:  He has been tolerating prednisone taper.  He has been on prednisone 2.'5mg'$  for about a month.  He has had two spells of double vision when he was looking at an airplane and another time when looking at a treeline.  No problems with droopy eyelids, difficulty swallowing/talking, or arm or leg weakness.   UPDATE 08/19/2021: He is here for follow-up visit.  He has been on prednisone 2.'5mg'$  and denies any weakness.  UPDATE 05/22/2022:  He is here for follow-up visit.  He has tapered off prednisone in early 2023 and doing great.  No double vision, difficulty with speech/swallow or limb weakness.  He is compliant with mestinon '60mg'$  BID and Cellcept '750mg'$  BID.   Medications:  Current Outpatient Medications on File Prior to Visit  Medication Sig Dispense Refill   Calcium-Magnesium-Vitamin D (CALCIUM 500 PO) Take 1 tablet by mouth at bedtime.      Emollient (DERMEND BRUISE FORMULA EX) Apply topically 2 (two) times daily as needed.     glucose blood (ACCU-CHEK GUIDE) test strip Test once daily.  Dx e11.9 100 each 3   JARDIANCE 10 MG TABS tablet Take 1 tablet by mouth once daily 90 tablet 2   levothyroxine (SYNTHROID) 25  MCG tablet TAKE 1 TABLET BY MOUTH ONCE DAILY BEFORE BREAKFAST . APPOINTMENT REQUIRED FOR FUTURE REFILLS 90 tablet 2   Melatonin 10 MG TABS Take 5 mg by mouth at bedtime.     metFORMIN (GLUCOPHAGE-XR) 750 MG 24 hr tablet TAKE 1 TABLET BY MOUTH ONCE DAILY IN THE MORNING AND AT BEDTIME 180 tablet 1   mycophenolate (CELLCEPT) 500 MG tablet TAKE 1 & 1/2 (ONE & ONE-HALF) TABLETS  BY MOUTH TWICE DAILY 270 tablet 0   pyridostigmine (MESTINON) 60 MG tablet Take 1 tablet (60 mg total) by mouth 2 (two) times daily. Refill when pt requests. 180 tablet 3   rosuvastatin (CRESTOR) 10 MG tablet Take 1 tablet by mouth once daily 90 tablet 1   tamsulosin (FLOMAX) 0.4 MG CAPS capsule Take 1 capsule by mouth once daily 90 capsule 1   Turmeric (QC TUMERIC COMPLEX) 500 MG CAPS Take by mouth.     vitamin B-12 (CYANOCOBALAMIN) 500 MCG tablet Take 500 mcg by mouth daily.     No current facility-administered medications on file prior to visit.    Allergies: No Known Allergies  Vital Signs:  There were no vitals taken for this visit.    Neurological Exam: MENTAL STATUS including orientation to time, place, person, recent and remote memory, attention span and concentration, language, and fund of knowledge is normal.  Speech is normal, no dysarthria.    CRANIAL NERVES: Normal conjugate, extra-ocular eye movements in all directions of gaze. Trace left ptosis at rest, no worsening with sustained upgaze.  Face is symmetric.  Facial muscles are 5/5 throughout - Orbicularis oris and oculi is 5/5; buccinator 5/5.  Tongue strength is 5/5.    MOTOR:  Motor strength is 5/5 throughout. Neck flexion is 5/5.   COORDINATION/GAIT:  He is able to stand up without pushing off.   Gait narrow based and stable.  Data: AChR binding 1.01*, blocking 61*, modulating 25*  MRI brain wwo contrast 10/27/2017:  1. No acute intracranial process or abnormal enhancement. 2. Mild parenchymal brain volume loss for age.  CT chest w contrast 02/03/2018: No acute chest abnormality. Specifically, no evidence for a mediastinal mass or abnormal thymic tissue. Probable scarring in the left lower lung. Indeterminate 4 mm nodular density in the right middle lobe. No follow-up needed if patient is low-risk. Non-contrast chest CT can be considered in 12 months if patient is high-risk.   Lab Results  Component Value Date    WBC 7.4 01/08/2022   HGB 15.0 01/08/2022   HCT 45.2 01/08/2022   MCV 93.5 01/08/2022   PLT 167.0 01/08/2022   Lab Results  Component Value Date   CREATININE 0.94 01/08/2022   BUN 20 01/08/2022   NA 139 01/08/2022   K 4.7 01/08/2022   CL 106 01/08/2022   CO2 25 01/08/2022   Lab Results  Component Value Date   HGBA1C 7.0 (H) 01/08/2022   Lab Results  Component Value Date   VITAMINB12 498 07/08/2019      Thank you for allowing me to participate in patient's care.  If I can answer any additional questions, I would be pleased to do so.    Sincerely,    Saheed Carrington K. Posey Pronto, DO

## 2022-05-23 ENCOUNTER — Encounter: Payer: Self-pay | Admitting: Neurology

## 2022-05-23 ENCOUNTER — Ambulatory Visit: Payer: Medicare Other | Admitting: Neurology

## 2022-05-23 VITALS — BP 114/74 | HR 65 | Ht 70.0 in | Wt 236.0 lb

## 2022-05-23 DIAGNOSIS — G7000 Myasthenia gravis without (acute) exacerbation: Secondary | ICD-10-CM

## 2022-05-23 DIAGNOSIS — Z5181 Encounter for therapeutic drug level monitoring: Secondary | ICD-10-CM | POA: Diagnosis not present

## 2022-05-23 MED ORDER — MYCOPHENOLATE MOFETIL 500 MG PO TABS: ORAL_TABLET | ORAL | 3 refills | Status: DC

## 2022-05-23 MED ORDER — PYRIDOSTIGMINE BROMIDE 60 MG PO TABS: 60.0000 mg | ORAL_TABLET | Freq: Two times a day (BID) | ORAL | 5 refills | Status: DC

## 2022-05-23 NOTE — Patient Instructions (Addendum)
You can adjust mestinon to twice daily as needed  Continue Cellcept '750mg'$  twice daily  Return to clinic in 9 months

## 2022-06-02 ENCOUNTER — Telehealth: Payer: Self-pay | Admitting: Family Medicine

## 2022-06-02 NOTE — Telephone Encounter (Signed)
Left message for patient to call back and schedule Medicare Annual Wellness Visit (AWV) either virtually or in office. Left  my Rodney Martinez number [phone]   Last AWV ;05/13/21  please schedule at anytime with [facility] Nurse Health Advisor 1 or 2

## 2022-06-23 ENCOUNTER — Telehealth: Payer: Self-pay | Admitting: Family Medicine

## 2022-06-23 NOTE — Telephone Encounter (Signed)
Left message for patient to call back and schedule Medicare Annual Wellness Visit (AWV) either virtually or in office. Left  my Herbie Drape number [phone]   Last AWV ;05/13/21  please schedule at anytime with [facility] Nurse Health Advisor 1 or 2

## 2022-07-01 ENCOUNTER — Telehealth: Payer: Self-pay | Admitting: Neurology

## 2022-07-01 ENCOUNTER — Other Ambulatory Visit (HOSPITAL_COMMUNITY): Payer: Self-pay

## 2022-07-01 NOTE — Telephone Encounter (Signed)
Called patients [facility] on [address] and was informed that they have patients prescription on file that was sent on 05/23/22 for 90 days with 3 refills. Pharmacy stated they will get medication ordered for patient.   Called patient and informed him of above and for him to contact his pharmacy. Patient verbalized understanding and had no further questions or concerns.

## 2022-07-01 NOTE — Telephone Encounter (Signed)
Pt needs a refill on his mycophenolate '500mg'$ . He said the pharmacy told him they sent over requests. [facility] on [address]

## 2022-07-03 ENCOUNTER — Other Ambulatory Visit (HOSPITAL_COMMUNITY): Payer: Self-pay

## 2022-07-14 ENCOUNTER — Encounter: Payer: Self-pay | Admitting: Family Medicine

## 2022-07-14 ENCOUNTER — Ambulatory Visit (INDEPENDENT_AMBULATORY_CARE_PROVIDER_SITE_OTHER): Payer: Medicare Other | Admitting: Family Medicine

## 2022-07-14 VITALS — BP 116/62 | HR 62 | Temp 97.5°F | Ht 70.0 in | Wt 248.5 lb

## 2022-07-14 DIAGNOSIS — E1165 Type 2 diabetes mellitus with hyperglycemia: Secondary | ICD-10-CM | POA: Diagnosis not present

## 2022-07-14 DIAGNOSIS — D369 Benign neoplasm, unspecified site: Secondary | ICD-10-CM

## 2022-07-14 DIAGNOSIS — Z23 Encounter for immunization: Secondary | ICD-10-CM | POA: Diagnosis not present

## 2022-07-14 DIAGNOSIS — K635 Polyp of colon: Secondary | ICD-10-CM

## 2022-07-14 DIAGNOSIS — E785 Hyperlipidemia, unspecified: Secondary | ICD-10-CM | POA: Diagnosis not present

## 2022-07-14 DIAGNOSIS — Z85828 Personal history of other malignant neoplasm of skin: Secondary | ICD-10-CM

## 2022-07-14 LAB — POCT GLYCOSYLATED HEMOGLOBIN (HGB A1C): Hemoglobin A1C: 6.6 % — AB (ref 4.0–5.6)

## 2022-07-14 NOTE — Progress Notes (Signed)
Established Patient Office Visit  Subjective   Patient ID: Rodney Martinez, male    DOB: 05/31/1951  Age: 71 y.o. MRN: [id number]  Chief Complaint  Patient presents with   Follow-up    HPI   Rodney Martinez has history of obstructive sleep apnea, type 2 diabetes, hypothyroidism, myasthenia gravis, BPH, history of tubular adenomas, dyslipidemia.  He is overdue for repeat colonoscopy and would like to go ahead and get that set up.  His previous dermatologist retired.  He has history of basal cell skin cancer and would like referral to another dermatologist.  Overdue for diabetic eye exam.  Myasthenia is improved.  He has not required any medications recently.  Last Mestinon dose was about a month ago.  No recent slurred speech.  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  Medications include metformin, Jardiance, levothyroxine, rosuvastatin, tamsulosin.  Blood pressure stable.  Past Medical History:  Diagnosis Date   Cataract 2020, 2021   Both eyes   Diabetes mellitus without complication ([place])    GERD 12/12/2009   HYPERGLYCEMIA 12/12/2009   Myasthenia gravis ([place])    OBSTRUCTIVE SLEEP APNEA 08/14/2010   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   left upper back - tx p bx   SCC (squamous cell carcinoma) 09/22/2018   left upper outer eyelid - CX3 + 5FU   Sleep apnea    uses CPAP    Thyroid disease    TRANSAMINASES, SERUM, ELEVATED 12/12/2009   Past Surgical History:  Procedure Laterality Date   BASAL CELL CARCINOMA EXCISION  2007   face (Mohs)   CATARACT EXTRACTION Right 08/2019   COLONOSCOPY  04/15/2012   HERNIA REPAIR  0272   umbilical   POLYPECTOMY      reports that he has been smoking cigars. He has never used smokeless tobacco. He reports that he does not currently use alcohol. He reports that he does not use drugs. family history includes Diabetes in his brother, maternal grandfather, mother, paternal grandfather, and paternal grandmother; Healthy in his daughter and sister;  Heart disease in his father. No Known Allergies  Review of Systems  Constitutional:  Negative for malaise/fatigue.  Eyes:  Negative for blurred vision.  Respiratory:  Negative for shortness of breath.   Cardiovascular:  Negative for chest pain.  Gastrointestinal:  Negative for abdominal pain and blood in stool.  Genitourinary:  Negative for hematuria.  Neurological:  Negative for dizziness, weakness and headaches.      Objective:     BP 116/62 (BP Location: Left Arm, Patient Position: Sitting, Cuff Size: Large)   Pulse 62   Temp (!) 97.5 F (36.4 C) (Oral)   Ht '5\' 10"'$  (1.778 m)   Wt 248 lb 8 oz (112.7 kg)   SpO2 98%   BMI 35.66 kg/m    Physical Exam Constitutional:      Appearance: He is well-developed.  HENT:     Right Ear: External ear normal.     Left Ear: External ear normal.  Eyes:     Pupils: Pupils are equal, round, and reactive to light.  Neck:     Thyroid: No thyromegaly.  Cardiovascular:     Rate and Rhythm: Normal rate and regular rhythm.  Pulmonary:     Effort: Pulmonary effort is normal. No respiratory distress.     Breath sounds: Normal breath sounds. No wheezing or rales.  Musculoskeletal:     Cervical back: Neck supple.     Right lower leg: No edema.  Left lower leg: No edema.  Skin:    Comments: Feet reveal no skin lesions. Good distal foot pulses. Good capillary refill. No calluses. Normal sensation with monofilament testing   Neurological:     Mental Status: He is alert and oriented to person, place, and time.      Results for orders placed or performed in visit on 07/14/22  POCT glycosylated hemoglobin (Hb A1C)  Result Value Ref Range   Hemoglobin A1C 6.6 (A) 4.0 - 5.6 %   HbA1c POC (<> result, manual entry)     HbA1c, POC (prediabetic range)     HbA1c, POC (controlled diabetic range)        The ASCVD Risk score (Arnett DK, et al., 2019) failed to calculate for the following reasons:   The valid total cholesterol range is 130  to 320 mg/dL    Assessment & Plan:   #1 type 2 diabetes controlled with A1c today 6.6%.  Patient no longer on prednisone for his myasthenia which is likely helping with his control.  Continue low glycemic diet.  Continue metformin and Jardiance.  Needs urine microalbumin with next labs need to set up diabetic eye exam  #2 history of tubular adenomas.  Patient overdue for colonoscopy.  He agrees to setting up repeat colonoscopy  #3 history of basal cell skin cancer.  Set up dermatology referral.  He needs to establish with another group as his previous dermatologist retired  #4 dyslipidemia treated with rosuvastatin.  Last lipids were checked in April.  Set up follow-up in about 6 months and plan to get full labs then.   Return in about 6 months (around 01/13/2023).    [name], MD

## 2022-07-14 NOTE — Patient Instructions (Signed)
I will be setting up referrals to GI and dermatology.  Set up diabetic eye exam

## 2022-07-25 ENCOUNTER — Telehealth: Payer: Self-pay | Admitting: Family Medicine

## 2022-07-25 NOTE — Telephone Encounter (Signed)
Left message for patient to call back and schedule Medicare Annual Wellness Visit (AWV) either virtually or in office. Left  my jabber number [phone]   Last AWV 05/13/21 please schedule with Nurse Health Adviser   45 min for awv-i and in office appointments 30 min for awv-s  phone/virtual appointments  

## 2022-09-02 ENCOUNTER — Encounter: Payer: Self-pay | Admitting: Neurology

## 2022-09-04 ENCOUNTER — Other Ambulatory Visit: Payer: Self-pay | Admitting: Family Medicine

## 2022-09-04 ENCOUNTER — Encounter: Payer: Self-pay | Admitting: Family Medicine

## 2022-09-09 DIAGNOSIS — G4733 Obstructive sleep apnea (adult) (pediatric): Secondary | ICD-10-CM | POA: Diagnosis not present

## 2022-09-17 ENCOUNTER — Telehealth: Payer: Self-pay | Admitting: Family Medicine

## 2022-09-17 NOTE — Telephone Encounter (Signed)
Left message for patient to call back and schedule Medicare Annual Wellness Visit (AWV) either virtually or in office. Left  my Herbie Drape number [phone]   Last AWV 05/13/21 please schedule with Nurse Health Adviser   45 min for awv-i and in office appointments 30 min for awv-s  phone/virtual appointments

## 2022-09-25 ENCOUNTER — Telehealth (INDEPENDENT_AMBULATORY_CARE_PROVIDER_SITE_OTHER): Payer: Medicare Other | Admitting: Family Medicine

## 2022-09-25 DIAGNOSIS — Z0000 Encounter for general adult medical examination without abnormal findings: Secondary | ICD-10-CM | POA: Diagnosis not present

## 2022-09-25 NOTE — Progress Notes (Signed)
PATIENT CHECK-IN and HEALTH RISK ASSESSMENT QUESTIONNAIRE:  -completed by phone/video for upcoming Medicare Preventive Visit  Pre-Visit Check-in: 1)Vitals (height, wt, BP, etc) - record in vitals section for visit on day of visit 2)Review and Update Medications, Allergies PMH, Surgeries, Social history in Epic 3)Hospitalizations in the last year with date/reason?  No 4)Review and Update Care Team (patient's specialists) in Epic 5) Complete PHQ9 in Epic  6) Complete Fall Screening in Epic 7)Review all Health Maintenance Due and order under PCP if not done.  Medicare Wellness Patient Questionnaire:  Answer theses question about your habits: Do you drink alcohol? yes If yes, how many drinks do you have a day? once a month, rare  Have you ever smoked?yes  Quit date if applicable?  N/A How many packs a day do/did you smoke? 5 cigars per day, smoked cigs about 20 years ago, then didn't smoke and then cigars the last 10 year Do you use smokeless tobacco? no Do you use an illicit drugs?no Do you exercises? No  IF so, what type and how many days/minutes per week?N/A, golf, has some hip and shoulder issues that limit him - does some exercises for this from physical therapy, a few days per week Are you sexually active? No Number of partners?0 Typical breakfast:no Typical lunch: stew Typical dinner: Sandwich Typical snacks:   Beverages: water,dr. pepper  Answer theses question about you: Can you perform most household chores?yes Do you find it hard to follow a conversation in a noisy room?yes, has had evaluation and has hearing aide Do you often ask people to speak up or repeat themselves?yes Do you feel that you have a problem with memory?no Do you balance your checkbook and or bank acounts?no Do you feel safe at home?yes Last dentist visit?05/2022 Do you need assistance with any of the following: Please note if so  No assistance needed.  Driving?  Feeding yourself?  Getting from bed to  chair?   Getting to the toilet?  Bathing or showering?  Dressing yourself?  Managing money?  Climbing a flight of stairs  Preparing meals?    Do you have Advanced Directives in place (Living Will, Healthcare Power or Attorney)?  No   Last eye Exam and location?N/A   Do you currently use prescribed or non-prescribed narcotic or opioid pain medications? Ibuprofen  Do you have a history or close family history of breast, ovarian, tubal or peritoneal cancer or a family member with BRCA (breast cancer susceptibility 1 and 2) gene mutations?  Nurse/Assistant Credentials/time stamp:   ----------------------------------------------------------------------------------------------------------------------------------------------------------------------------------------------------------------------    MEDICARE ANNUAL PREVENTIVE CARE VISIT WITH PROVIDER (Welcome to Medicare, initial annual wellness or annual wellness exam)  Virtual Visit via Phone Note  I connected with Rodney Martinez  on 09/25/22 by phone and verified that I am speaking with the correct person using two identifiers.  Location patient: home Location provider:work or home office Persons participating in the virtual visit: patient, provider  Concerns and/or follow up today: none   See HM section in Epic for other details of completed HM.    ROS: negative for report of fevers, unintentional weight loss, vision changes, vision loss, hearing loss or change, chest pain, sob, hemoptysis, melena, hematochezia, hematuria, genital discharge or lesions, falls, bleeding or bruising, loc, thoughts of suicide or self harm, memory loss  Patient-completed extensive health risk assessment - reviewed and discussed with the patient: See Health Risk Assessment completed with patient prior to the visit either above or in recent phone note. This was reviewed  in detailed with the patient today and appropriate recommendations, orders and referrals  were placed as needed per Summary below and patient instructions.   Review of Medical History: -PMH, [place], Family History and current specialty and care providers reviewed and updated and listed below   Patient Care Team: [name], MD as PCP - [place], [place], DO as Consulting Physician (Neurology) Derek Jack, MD as Consulting Physician (Hematology and Oncology) Viona Gilmore, [facility] as Pharmacist (Pharmacist) Lavonna Monarch, MD (Inactive) as Consulting Physician (Dermatology)   Past Medical History:  Diagnosis Date   Cataract 2020, 2021   Both eyes   Diabetes mellitus without complication ([place])    GERD 12/12/2009   HYPERGLYCEMIA 12/12/2009   Myasthenia gravis ([place])    OBSTRUCTIVE SLEEP APNEA 08/14/2010   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   left upper back - tx p bx   SCC (squamous cell carcinoma) 09/22/2018   left upper outer eyelid - CX3 + 5FU   Sleep apnea    uses CPAP    Thyroid disease    TRANSAMINASES, SERUM, ELEVATED 12/12/2009    Past Surgical History:  Procedure Laterality Date   BASAL CELL CARCINOMA EXCISION  2007   face (Mohs)   CATARACT EXTRACTION Right 08/2019   COLONOSCOPY  04/15/2012   HERNIA REPAIR  1443   umbilical   POLYPECTOMY      Social History   Socioeconomic History   Marital status: Married    Spouse name: Not on file   Number of children: 1   Years of education: vet school   Highest education level: Professional school degree (e.g., MD, DDS, DVM, JD)  Occupational History   Occupation: retired Animal nutritionist  Tobacco Use   Smoking status: Every Day    Types: Cigars   Smokeless tobacco: Never   Tobacco comments:    Pt states that he smokes 6 cigars a day, he smoked cigarettes for 10 years.  Vaping Use   Vaping Use: Never used  Substance and Sexual Activity   Alcohol use: Not Currently    Comment: rare   Drug use: No   Sexual activity: Not on file  Other Topics Concern   Not on file  Social History  Narrative   Lives with wife in a 2 story home.  Has one child. Retired Animal nutritionist.     Right handed    Social Determinants of Health   Financial Resource Strain: Low Risk  (05/13/2021)   Overall Financial Resource Strain (CARDIA)    Difficulty of Paying Living Expenses: Not hard at all  Food Insecurity: No Food Insecurity (05/13/2021)   Hunger Vital Sign    Worried About Running Out of Food in the Last Year: Never true    Ran Out of Food in the Last Year: Never true  Transportation Needs: No Transportation Needs (05/13/2021)   PRAPARE - Hydrologist (Medical): No    Lack of Transportation (Non-Medical): No  Physical Activity: Inactive (05/13/2021)   Exercise Vital Sign    Days of Exercise per Week: 0 days    Minutes of Exercise per Session: 0 min  Stress: No Stress Concern Present (05/13/2021)   [place]    Feeling of Stress : Not at all  Social Connections: Moderately Isolated (05/13/2021)   Social Connection and Isolation Panel [NHANES]    Frequency of Communication with Friends and Family: Three times a week    Frequency of  Social Gatherings with Friends and Family: Three times a week    Attends Religious Services: Never    Active Member of Clubs or Organizations: No    Attends Archivist Meetings: Never    Marital Status: Married  Human resources officer Violence: Not At Risk (05/13/2021)   Humiliation, Afraid, Rape, and Kick questionnaire    Fear of Current or Ex-Partner: No    Emotionally Abused: No    Physically Abused: No    Sexually Abused: No    Family History  Problem Relation Age of Onset   Diabetes Mother        type ll   Heart disease Father        ?atrial fibrillation   Diabetes Maternal Grandfather    Diabetes Paternal Grandmother    Diabetes Paternal Grandfather    Healthy Sister    Diabetes Brother    Healthy Daughter    Colon cancer Neg Hx    Stomach cancer  Neg Hx    Colon polyps Neg Hx    Esophageal cancer Neg Hx    Rectal cancer Neg Hx     Current Outpatient Medications on File Prior to Visit  Medication Sig Dispense Refill   Calcium-Magnesium-Vitamin D (CALCIUM 500 PO) Take 1 tablet by mouth at bedtime.      Emollient (DERMEND BRUISE FORMULA EX) Apply topically 2 (two) times daily as needed.     glucose blood (ACCU-CHEK GUIDE) test strip Test once daily.  Dx e11.9 100 each 3   JARDIANCE 10 MG TABS tablet Take 1 tablet by mouth once daily 90 tablet 2   levothyroxine (SYNTHROID) 25 MCG tablet TAKE 1 TABLET BY MOUTH ONCE DAILY BEFORE BREAKFAST . APPOINTMENT REQUIRED FOR FUTURE REFILLS 90 tablet 2   Melatonin 10 MG TABS Take 5 mg by mouth at bedtime.     metFORMIN (GLUCOPHAGE-XR) 750 MG 24 hr tablet TAKE 1 TABLET BY MOUTH ONCE DAILY IN THE MORNING AND AT BEDTIME 180 tablet 1   mycophenolate (CELLCEPT) 500 MG tablet TAKE 1 & 1/2 (ONE & ONE-HALF) TABLETS BY MOUTH TWICE DAILY 270 tablet 3   rosuvastatin (CRESTOR) 10 MG tablet Take 1 tablet by mouth once daily 90 tablet 1   tamsulosin (FLOMAX) 0.4 MG CAPS capsule Take 1 capsule by mouth once daily 90 capsule 0   Turmeric (QC TUMERIC COMPLEX) 500 MG CAPS Take by mouth.     vitamin B-12 (CYANOCOBALAMIN) 500 MCG tablet Take 500 mcg by mouth daily.     No current facility-administered medications on file prior to visit.    No Known Allergies     Physical Exam There were no vitals filed for this visit. Estimated body mass index is 35.66 kg/m as calculated from the following:   Height as of 07/14/22: _0  (1.778 m).   Weight as of 07/14/22: 248 lb 8 oz (112.7 kg).  EKG (optional): deferred due to virtual visit  GENERAL: alert, oriented, no audible sounds of distress, full vision exam deferred due to pandemic and/or virtual encounter  PSYCH/NEURO: pleasant and cooperative, no obvious depression or anxiety, speech and thought processing grossly intact, Cognitive function grossly  intact  Flowsheet Row Video Visit from 09/25/2022 in [place] at [facility]  PHQ-9 Total Score 1           09/25/2022    4:56 PM 07/14/2022   10:26 AM 01/08/2022   10:45 AM 05/13/2021    9:57 AM 05/13/2021    9:54 AM  Depression screen PHQ  2/9  Decreased Interest 0 0 2 0 0  Down, Depressed, Hopeless 0 0 0 0 0  PHQ - 2 Score 0 0 2 0 0  Altered sleeping 0  2    Tired, decreased energy 1  3    Change in appetite 0  1    Feeling bad or failure about yourself  0  0    Trouble concentrating 0  0    Moving slowly or fidgety/restless 0  2    Suicidal thoughts 0  1    PHQ-9 Score 1  11    Difficult doing work/chores Not difficult at all  Somewhat difficult         05/13/2021    9:57 AM 08/19/2021   11:06 AM 05/23/2022    8:33 AM 07/14/2022   10:26 AM 09/25/2022    4:56 PM  Fall Risk  Falls in the past year? 0 1 0 0 0  Was there an injury with Fall? 0 0 0 0 0  Fall Risk Category Calculator 0 1 0 0 0  Fall Risk Category _0   Patient Fall Risk Level _1   Patient at Risk for Falls Due to No Fall Risks   No Fall Risks   Fall risk Follow up Falls evaluation completed   Falls evaluation completed      SUMMARY AND PLAN:  Medicare annual wellness visit, subsequent   Discussed applicable health maintenance/preventive health measures and advised and referred or ordered per patient preferences:  Health Maintenance  Topic Date Due   Zoster Vaccines- Shingrix (1 of 2) Never done, advised, asked if any questions, advised can get at pharmacy or at doctor office.   COLONOSCOPY (Pts 45-15yr Insurance coverage will need to be confirmed)  11/19/2019 Past due, advised on importance, he reports he will call GI to schedule.    DTaP/Tdap/Td (2 - Tdap) 12/13/2019, advised, answered questions, advised can get at pharmacy or doctor office.    COVID-19 Vaccine (3 - Moderna risk series) 01/11/2020, advised, see  above   OPHTHALMOLOGY EXAM  07/07/2020, advised   Diabetic kidney evaluation - Urine ACR  09/25/2021, sees PCP yearly for labs   Diabetic kidney evaluation - eGFR measurement  01/09/2023   HEMOGLOBIN A1C  01/13/2023   FOOT EXAM  07/15/2023   Medicare Annual Wellness (AWV)  09/26/2023   Pneumonia Vaccine 71 Years old  Completed   INFLUENZA VACCINE  Completed   Hepatitis C Screening  Completed   HPV VACCINES  Aged OI[employer]/InterActiveCorpand counseling on the following was provided based on the above review of health and a plan/checklist for the patient, along with additional information discussed, was provided for the patient in the patient instructions :  -Advised on importance of and resources for completing advanced directives -Provided counseling and plan for difficulty hearing discussed/referral to audiology or ENT if applicable per above screening. -Provided counseling and plan for increased risk of falling if applicable per above screening. (Referral for PT, community based exercise programs, etc.) -Provided counseling and plan for function difficulties/ difficulties with ADLs if applicable per above screening. -Advised and counseled on maintaining healthy weight and healthy lifestyle - including the importance of a health diet, regular physical activity, social connections and stress management. -Advised and counseled on a whole foods based healthy diet and regular exercise: discussed a heart healthy whole foods based diet at  length. A summary of a healthy diet was provided in the Patient Instructions.Offered referral to dietician/weight management clinic if applicable and follow up virtual visits to assist further and monitor progress. Recommended regular exercise and discussed options within the community.  -Advised yearly dental visits at minimum and regular eye exams -Advised and counseled on alcohol, tobacco, drug, opoid use/misuse if applicable and options for help if  applicable.  Follow up: see patient instructions   Patient Instructions  I really enjoyed getting to talk with you today! I am available on Tuesdays and Thursdays for virtual visits if you have any questions or concerns, or if I can be of any further assistance.   CHECKLIST FROM ANNUAL WELLNESS VISIT:  -Follow up (please call to schedule if not scheduled after visit):  -please call your gastroenterology office to schedule your colonoscopy -  your gastroenterology office  instructs you to call: [phone]. Please let us know if you need any assistant with reaching them/scheduling.  -yearly for annual wellness visit with primary care office and regular visits with Dr. Elease Hashimoto  Here is a list of your preventive care/health maintenance measures and the plan for each if any are due:  Health Maintenance  Topic Date Due   Zoster Vaccines- Shingrix (1 of 2) Never done, can get at pharmacy   COLONOSCOPY (Pts 45-94yr Insurance coverage will need to be confirmed)  11/19/2019, please call tomorrow to schedule   DTaP/Tdap/Td (2 - Tdap) 12/13/2019, can get at pharmacy or doctor office   COVID-19 Vaccine (3 - Moderna risk series) 01/11/2020, can get at [facility] 07/07/2020, please call your eye doctor to schedule your diabetic eye exam   Diabetic kidney evaluation - Urine ACR  09/25/2021   Medicare Annual Wellness (AWV)  05/13/2022   Diabetic kidney evaluation - eGFR measurement  01/09/2023   HEMOGLOBIN A1C  01/13/2023   FOOT EXAM  07/15/2023   Pneumonia Vaccine 71 Years old  Completed   INFLUENZA VACCINE  Completed   Hepatitis C Screening  Completed   HPV VACCINES  Aged Out    -See a dentist at least yearly  -Please schedule regular eye exams  -Other issues addressed today: -please consider quitting smoking, let u[place]now if we can help in any way -included some information about advanced directives below  -I have included below further information regarding a  healthy whole foods based diet, physical activity guidelines for adults, stress management and opportunities for social connections. I hope you find this information useful.   -----------------------------------------------------------------------------------------------------------------------------------------------------------------------------------------------------------------------------------------------------------  NUTRITION: -lots of colorful vegetables (half the plate) and fruits -5-7 servings of vegetables and fruits per day (fresh or steamed is best), exp. 2 servings of vegetables with lunch and dinner and 2 servings of fruit per day. Berries and greens such as kale and collards are great choices.  -consume on a regular basis: whole grains (make sure first ingredient on label contains the word "whole"), fresh fruits, fish, nuts, seeds, healthy oils (such as olive oil, avocado oil, grape seed oil) -may eat small amounts of dairy and lean meat on occasion, but avoid processed meats such as ham, bacon, lunch meat, etc. -drink water -try to avoid fast food and pre-packaged foods, processed meat -most experts advise limiting sodium to < 23040mper day, should limit further is any chronic conditions such as high blood pressure, heart disease, diabetes, etc. The American Heart Association advised that < 150060ms is ideal -try to avoid foods that contain any ingredients with  names you do not recognize  -try to avoid sugar/sweets (except for the natural sugar that occurs in fresh fruit) -try to avoid sweet drinks -try to avoid white rice, white bread, pasta (unless whole grain), white or yellow potatoes  EXERCISE GUIDELINES FOR ADULTS: -if you wish to increase your physical activity, do so gradually and with the approval of your doctor -STOP and seek medical care immediately if you have any chest pain, chest discomfort or trouble breathing when starting or increasing exercise  -move and  stretch your body, legs, feet and arms when sitting for long periods -Physical activity guidelines for optimal health in adults: -least 150 minutes per week of aerobic exercise (can talk, but not sing) once approved by your doctor, 20-30 minutes of sustained activity or two 10 minute episodes of sustained activity every day.  -resistance training at least 2 days per week if approved by your doctor -balance exercises 3+ days per week:   Stand somewhere where you have something sturdy to hold onto if you lose balance.    1) lift up on toes, start with 5x per day and work up to 20x   2) stand and lift on leg straight out to the side so that foot is a few inches of the floor, start with 5x each side and work up to 20x each side   3) stand on one foot, start with 5 seconds each side and work up to 20 seconds on each side  If you need ideas or help with getting more active:  -Silver sneakers https://tools.silversneakers.com  -Walk with a Doc: http://stephens-thompson.biz/  -try to include resistance (weight lifting/strength building) and balance exercises twice per week: or the following link for ideas: ChessContest.fr  UpdateClothing.com.cy  STRESS MANAGEMENT: -can try meditating, or just sitting quietly with deep breathing while intentionally relaxing all parts of your body for 5 minutes daily -if you need further help with stress, anxiety or depression please follow up with your primary doctor or contact the wonderful folks at [place]: [place]: -options in [place] if you wish to engage in more social and exercise related activities:  -Silver sneakers https://tools.silversneakers.com  -Walk with a Doc: http://stephens-thompson.biz/  -Check out the [place] 50+ section on the [place] [employer] (hiking clubs, book clubs, cards and  games, chess, exercise classes, aquatic classes and much more) - see the website for details: https://www.[place].gov/departments/parks-recreation/active-adults50  -YouTube has lots of exercise videos for different ages and abilities as well  -[place] (a variety of indoor and outdoor inperson activities for adults). 8146238063. [address].  -Virtual Online Classes (a variety of topics): see seniorplanet.org or call [phone]  -consider volunteering at a school, hospice center, church, senior center or elsewhere    ADVANCED HEALTHCARE DIRECTIVES:  Everyone should have advanced health care directives in place. This is so that you get the care you want, should you ever be in a situation where you are unable to make your own medical decisions.   From the [place] Advanced Directive Website: "[place] are legal documents in which you give written instructions about your health care if, in the future, you cannot speak for yourself.   A health care power of attorney allows you to name a person you trust to make your health care decisions if you cannot make them yourself. A declaration of a desire for a natural death (or living will) is document, which states that you desire not to  have your life prolonged by extraordinary measures if you have a terminal or incurable illness or if you are in a vegetative state. An advance instruction for mental health treatment makes a declaration of instructions, information and preferences regarding your mental health treatment. It also states that you are aware that the advance instruction authorizes a mental health treatment provider to act according to your wishes. It may also outline your consent or refusal of mental health treatment. A declaration of an anatomical gift allows anyone over the age of 50 to make a gift by will, organ donor card or other document."   Please see the following website or an  elder law attorney for forms, FAQs and for completion of advanced directives: [place] Secretary of [place] ([email or web address].no)  Or copy and paste the following to your web browser: PokerReunion.com.cy       Lucretia Kern, DO

## 2022-09-25 NOTE — Patient Instructions (Addendum)
I really enjoyed getting to talk with you today! I am available on Tuesdays and Thursdays for virtual visits if you have any questions or concerns, or if I can be of any further assistance.   CHECKLIST FROM ANNUAL WELLNESS VISIT:  -Follow up (please call to schedule if not scheduled after visit):  -please call your gastroenterology office to schedule your colonoscopy -  your gastroenterology office  instructs you to call: [phone]. Please let us know if you need any assistant with reaching them/scheduling.  -yearly for annual wellness visit with primary care office and regular visits with Dr. Elease Hashimoto  Here is a list of your preventive care/health maintenance measures and the plan for each if any are due:  Health Maintenance  Topic Date Due   Zoster Vaccines- Shingrix (1 of 2) Never done, can get at pharmacy   COLONOSCOPY (Pts 45-34yr Insurance coverage will need to be confirmed)  11/19/2019, please call tomorrow to schedule   DTaP/Tdap/Td (2 - Tdap) 12/13/2019, can get at pharmacy or doctor office   COVID-19 Vaccine (3 - Moderna risk series) 01/11/2020, can get at [facility] 07/07/2020, please call your eye doctor to schedule your diabetic eye exam   Diabetic kidney evaluation - Urine ACR  09/25/2021   Medicare Annual Wellness (AWV)  05/13/2022   Diabetic kidney evaluation - eGFR measurement  01/09/2023   HEMOGLOBIN A1C  01/13/2023   FOOT EXAM  07/15/2023   Pneumonia Vaccine 71 Years old  Completed   INFLUENZA VACCINE  Completed   Hepatitis C Screening  Completed   HPV VACCINES  Aged Out    -See a dentist at least yearly  -Please schedule regular eye exams  -Other issues addressed today: -please consider quitting smoking, let [place] if we can help in any way -included some information about advanced directives below  -I have included below further information regarding a healthy whole foods based diet, physical activity guidelines for adults, stress  management and opportunities for social connections. I hope you find this information useful.   -----------------------------------------------------------------------------------------------------------------------------------------------------------------------------------------------------------------------------------------------------------  NUTRITION: -lots of colorful vegetables (half the plate) and fruits -5-7 servings of vegetables and fruits per day (fresh or steamed is best), exp. 2 servings of vegetables with lunch and dinner and 2 servings of fruit per day. Berries and greens such as kale and collards are great choices.  -consume on a regular basis: whole grains (make sure first ingredient on label contains the word "whole"), fresh fruits, fish, nuts, seeds, healthy oils (such as olive oil, avocado oil, grape seed oil) -may eat small amounts of dairy and lean meat on occasion, but avoid processed meats such as ham, bacon, lunch meat, etc. -drink water -try to avoid fast food and pre-packaged foods, processed meat -most experts advise limiting sodium to < 23080mper day, should limit further is any chronic conditions such as high blood pressure, heart disease, diabetes, etc. The American Heart Association advised that < 150037ms is ideal -try to avoid foods that contain any ingredients with names you do not recognize  -try to avoid sugar/sweets (except for the natural sugar that occurs in fresh fruit) -try to avoid sweet drinks -try to avoid white rice, white bread, pasta (unless whole grain), white or yellow potatoes  EXERCISE GUIDELINES FOR ADULTS: -if you wish to increase your physical activity, do so gradually and with the approval of your doctor -STOP and seek medical care immediately if you have any chest pain, chest discomfort or trouble breathing  when starting or increasing exercise  -move and stretch your body, legs, feet and arms when sitting for long periods -Physical  activity guidelines for optimal health in adults: -least 150 minutes per week of aerobic exercise (can talk, but not sing) once approved by your doctor, 20-30 minutes of sustained activity or two 10 minute episodes of sustained activity every day.  -resistance training at least 2 days per week if approved by your doctor -balance exercises 3+ days per week:   Stand somewhere where you have something sturdy to hold onto if you lose balance.    1) lift up on toes, start with 5x per day and work up to 20x   2) stand and lift on leg straight out to the side so that foot is a few inches of the floor, start with 5x each side and work up to 20x each side   3) stand on one foot, start with 5 seconds each side and work up to 20 seconds on each side  If you need ideas or help with getting more active:  -Silver sneakers https://tools.silversneakers.com  -Walk with a Doc: http://stephens-thompson.biz/  -try to include resistance (weight lifting/strength building) and balance exercises twice per week: or the following link for ideas: ChessContest.fr  UpdateClothing.com.cy  STRESS MANAGEMENT: -can try meditating, or just sitting quietly with deep breathing while intentionally relaxing all parts of your body for 5 minutes daily -if you need further help with stress, anxiety or depression please follow up with your primary doctor or contact the wonderful folks at [place]: [place]: -options in [place] if you wish to engage in more social and exercise related activities:  -Silver sneakers https://tools.silversneakers.com  -Walk with a Doc: http://stephens-thompson.biz/  -Check out the [place] 50+ section on the [place] [employer] (hiking clubs, book clubs, cards and games, chess, exercise classes, aquatic classes and much more) - see the website for  details: https://www.[place].gov/departments/parks-recreation/active-adults50  -YouTube has lots of exercise videos for different ages and abilities as well  -[place] (a variety of indoor and outdoor inperson activities for adults). (959)856-3772. [address].  -Virtual Online Classes (a variety of topics): see seniorplanet.org or call 609-887-2325  -consider volunteering at a school, hospice center, church, senior center or elsewhere    ADVANCED HEALTHCARE DIRECTIVES:  Everyone should have advanced health care directives in place. This is so that you get the care you want, should you ever be in a situation where you are unable to make your own medical decisions.   From the [place] Advanced Directive Website: "[place] are legal documents in which you give written instructions about your health care if, in the future, you cannot speak for yourself.   A health care power of attorney allows you to name a person you trust to make your health care decisions if you cannot make them yourself. A declaration of a desire for a natural death (or living will) is document, which states that you desire not to have your life prolonged by extraordinary measures if you have a terminal or incurable illness or if you are in a vegetative state. An advance instruction for mental health treatment makes a declaration of instructions, information and preferences regarding your mental health treatment. It also states that you are aware that the advance instruction authorizes a mental health treatment provider to act according to your wishes. It may also outline your consent or refusal of mental health treatment. A declaration  of an anatomical gift allows anyone over the age of 82 to make a gift by will, organ donor card or other document."   Please see the following website or an elder law attorney for forms, FAQs and for completion of advanced directives: [place] Secretary of [place] ([email or web address].no)  Or copy and paste the following to your web browser: [email or web address]

## 2022-10-05 IMAGING — DX DG SHOULDER 2+V*R*
3 series · 3 of 3 positions shown · non-contrast
Comparison: Left shoulder radiographs obtained at the same time.
Chest CT dated 02/02/2018.

CLINICAL DATA: Bilateral shoulder pain for the past 2 months, right
greater than left. No known injury.

EXAM:
RIGHT SHOULDER - 2+ VIEW

[shoulder ap (1 of 2)]
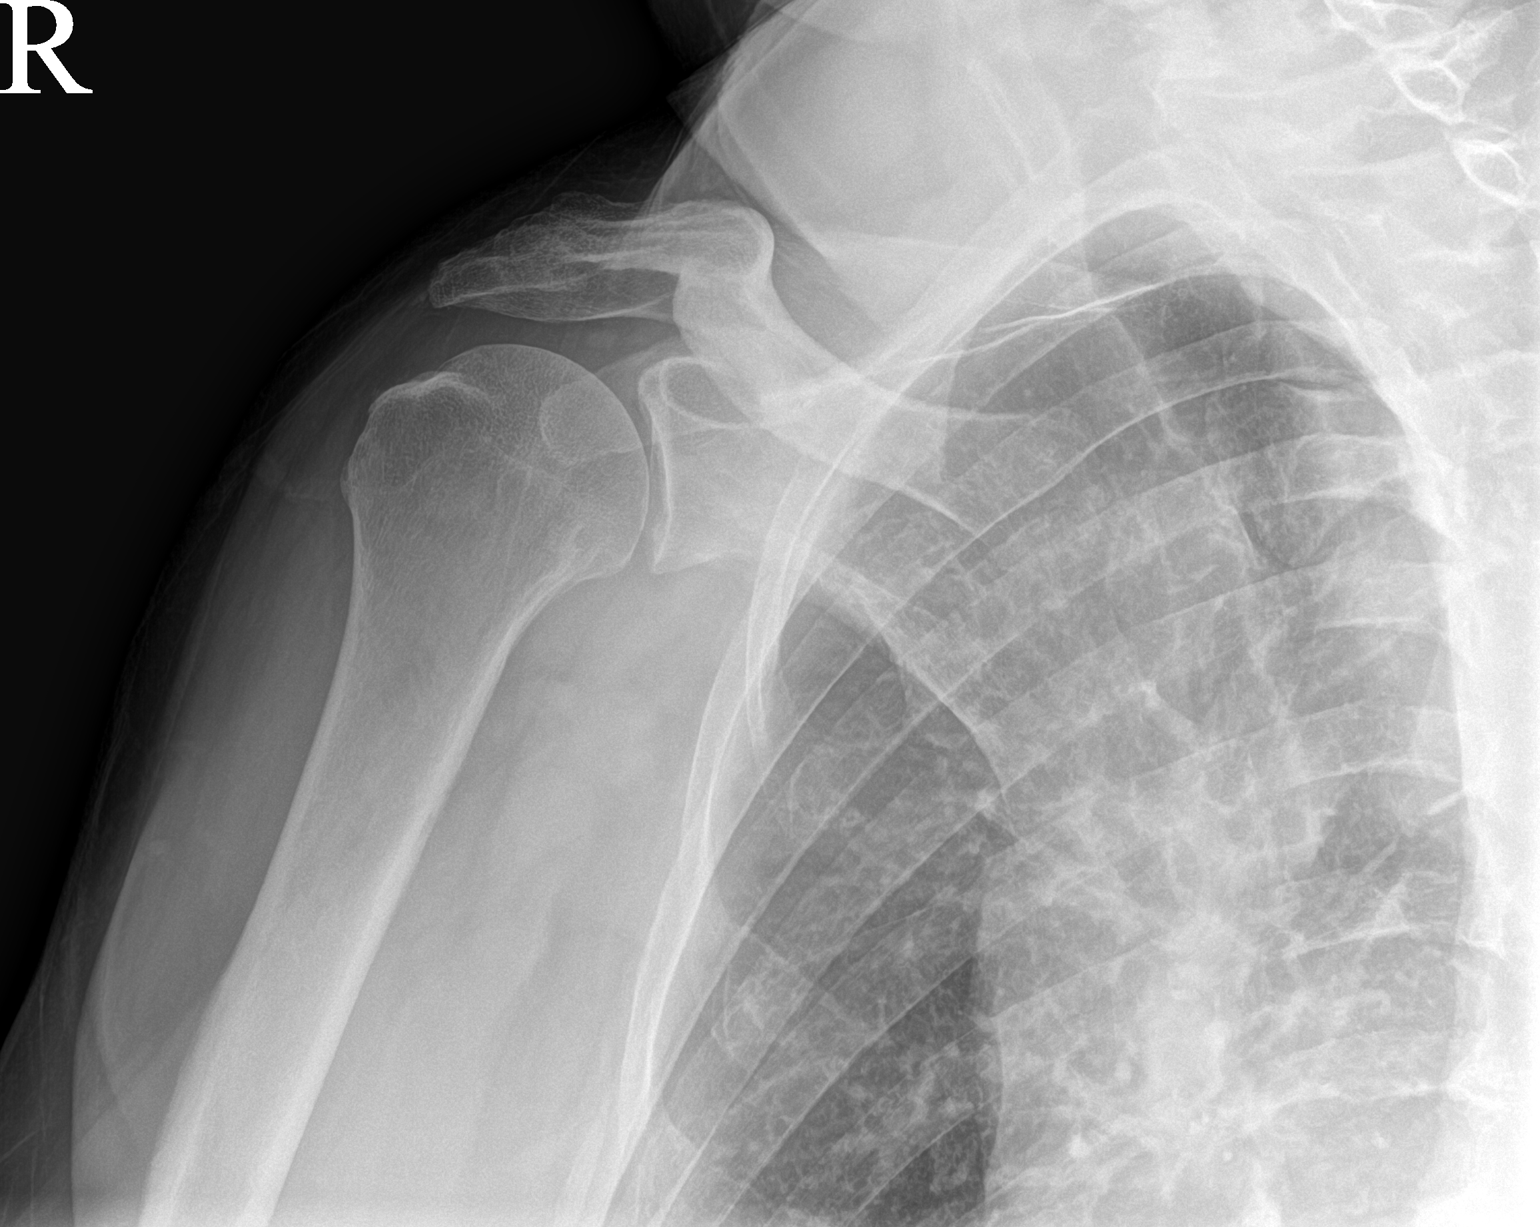

[shoulder ap (2 of 2)]
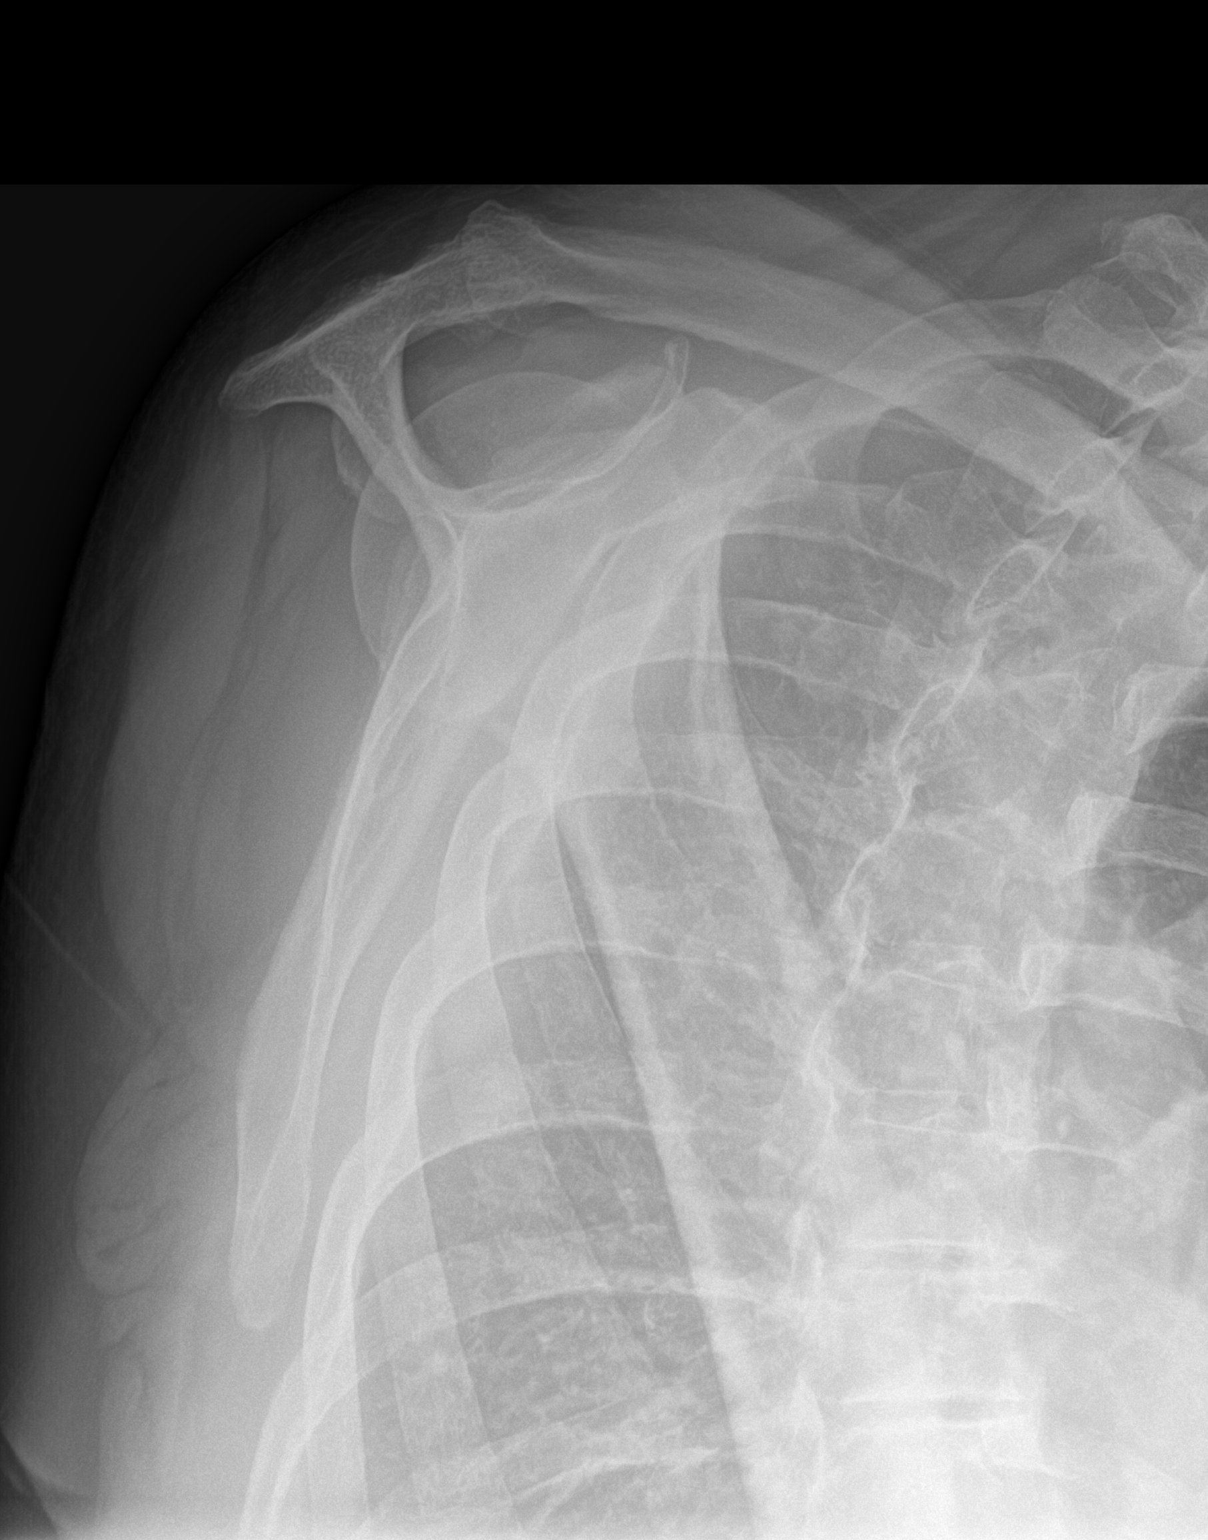

[shoulder axial]
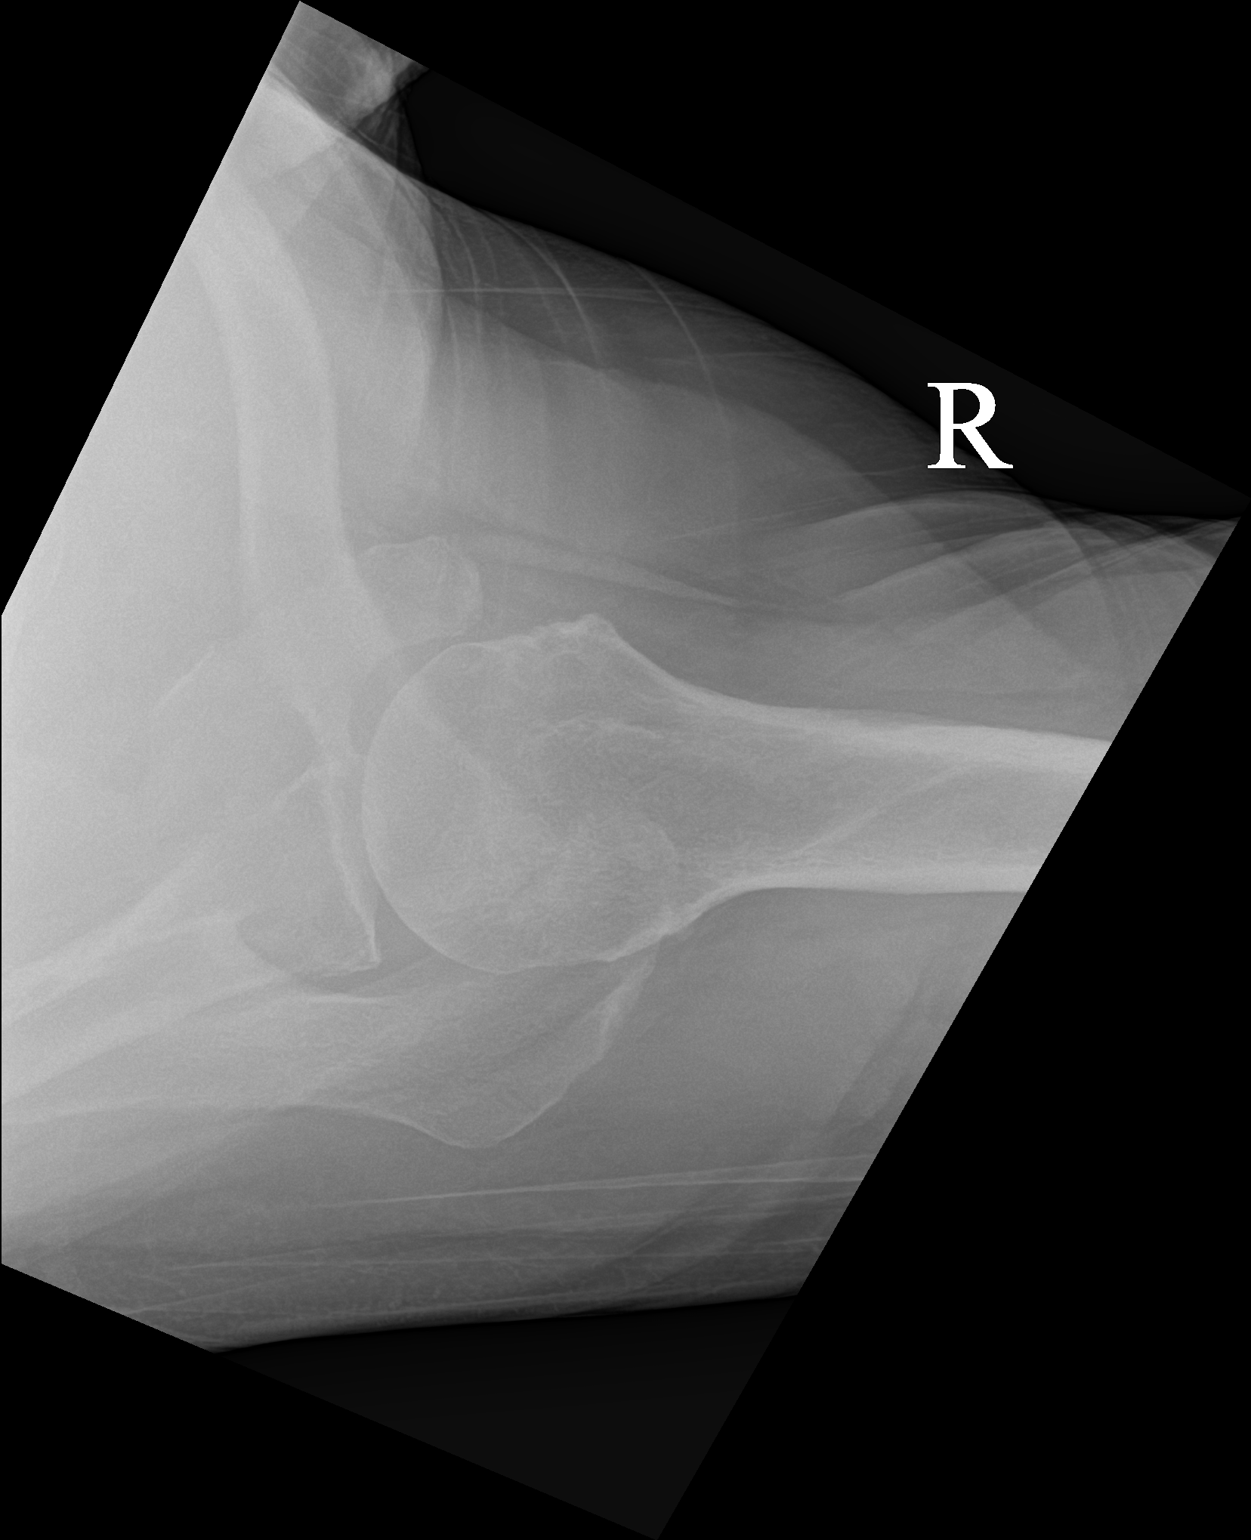

[3 of 3 positions shown; findings below may reference images not displayed]

FINDINGS: Minimal inferior glenohumeral spur formation. Moderate inferior
acromioclavicular spur formation. No areas of bone erosion are seen.
IMPRESSION: Minimal glenohumeral and moderate acromioclavicular joint
degenerative spur formation.

## 2022-10-05 IMAGING — DX DG SHOULDER 2+V*L*
3 series · 3 of 3 positions shown · non-contrast
Comparison: Right shoulder obtained at the same time. Chest CT
dated 02/02/2018.

CLINICAL DATA: Bilateral shoulder pain for the past 2 months, right
more than left. No known injury.

EXAM:
LEFT SHOULDER - 2+ VIEW

[shoulder ap (1 of 2)]
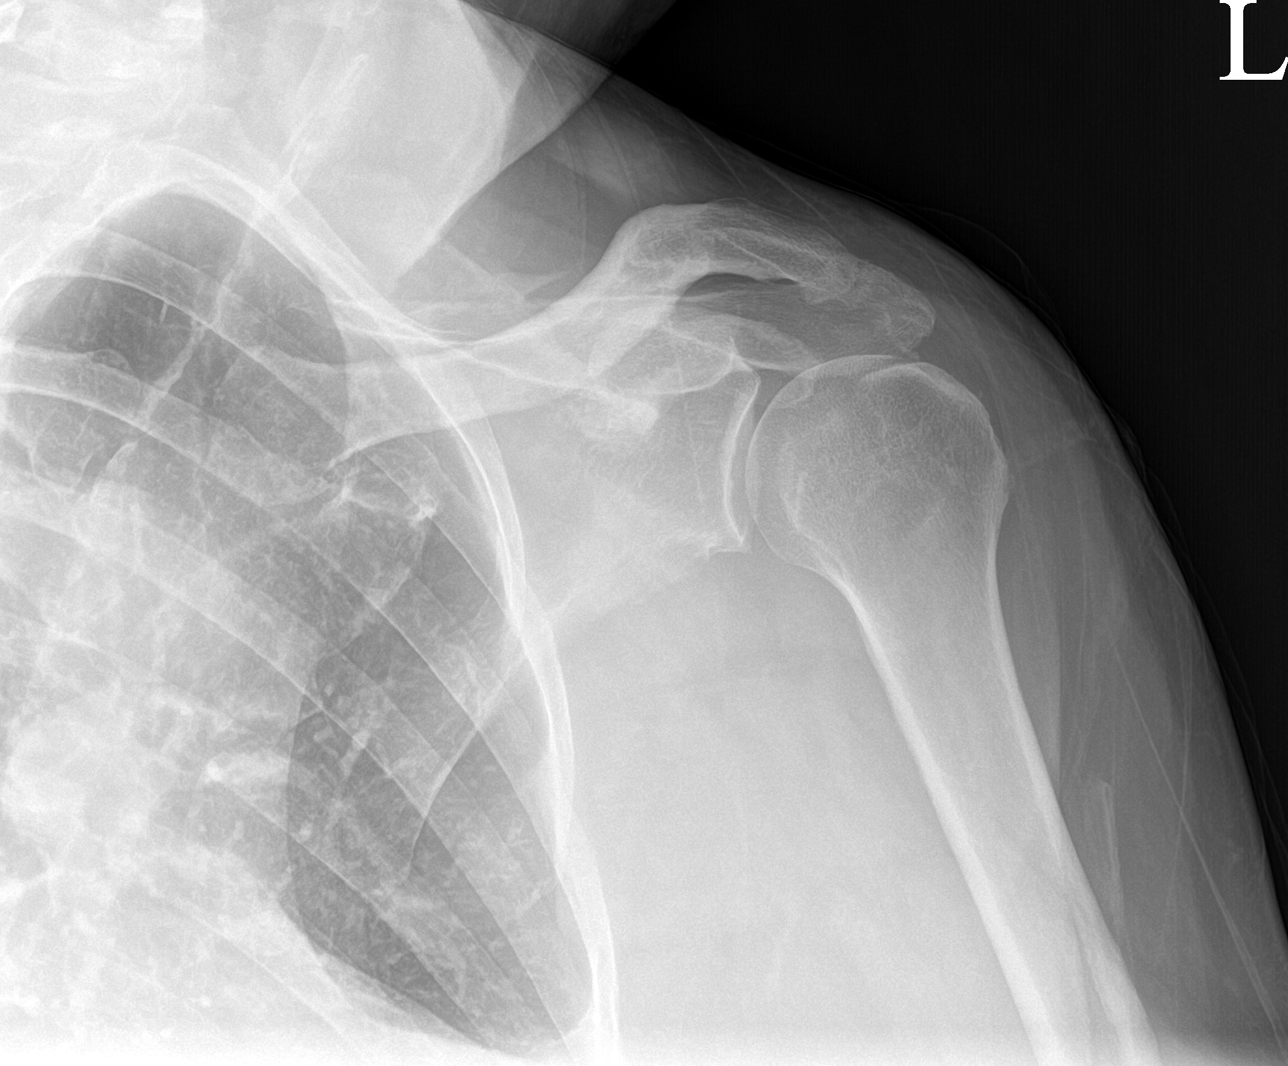

[shoulder ap (2 of 2)]
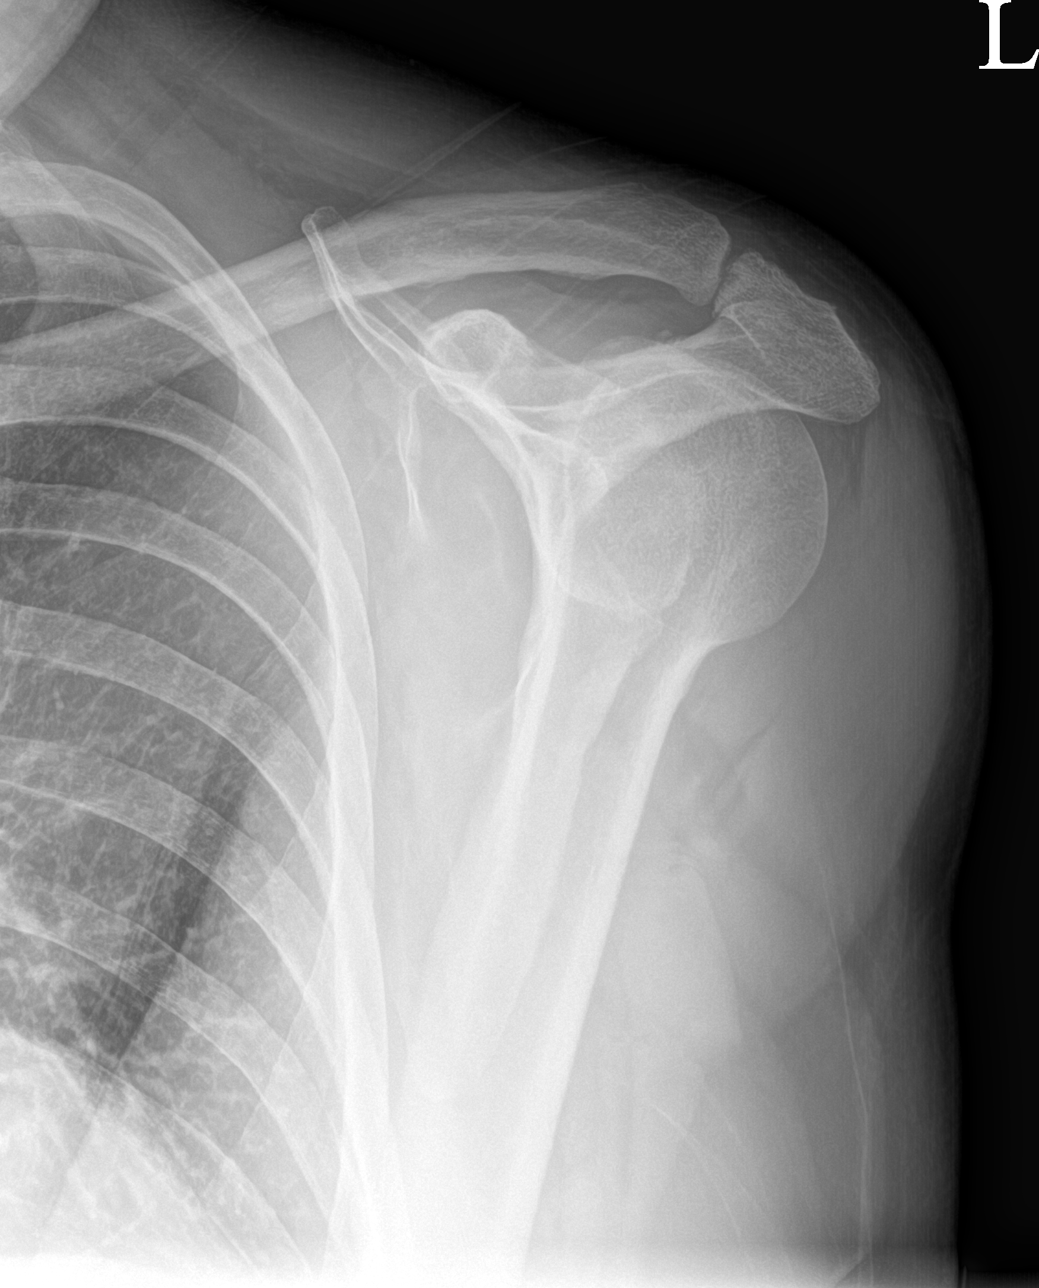

[shoulder axial]
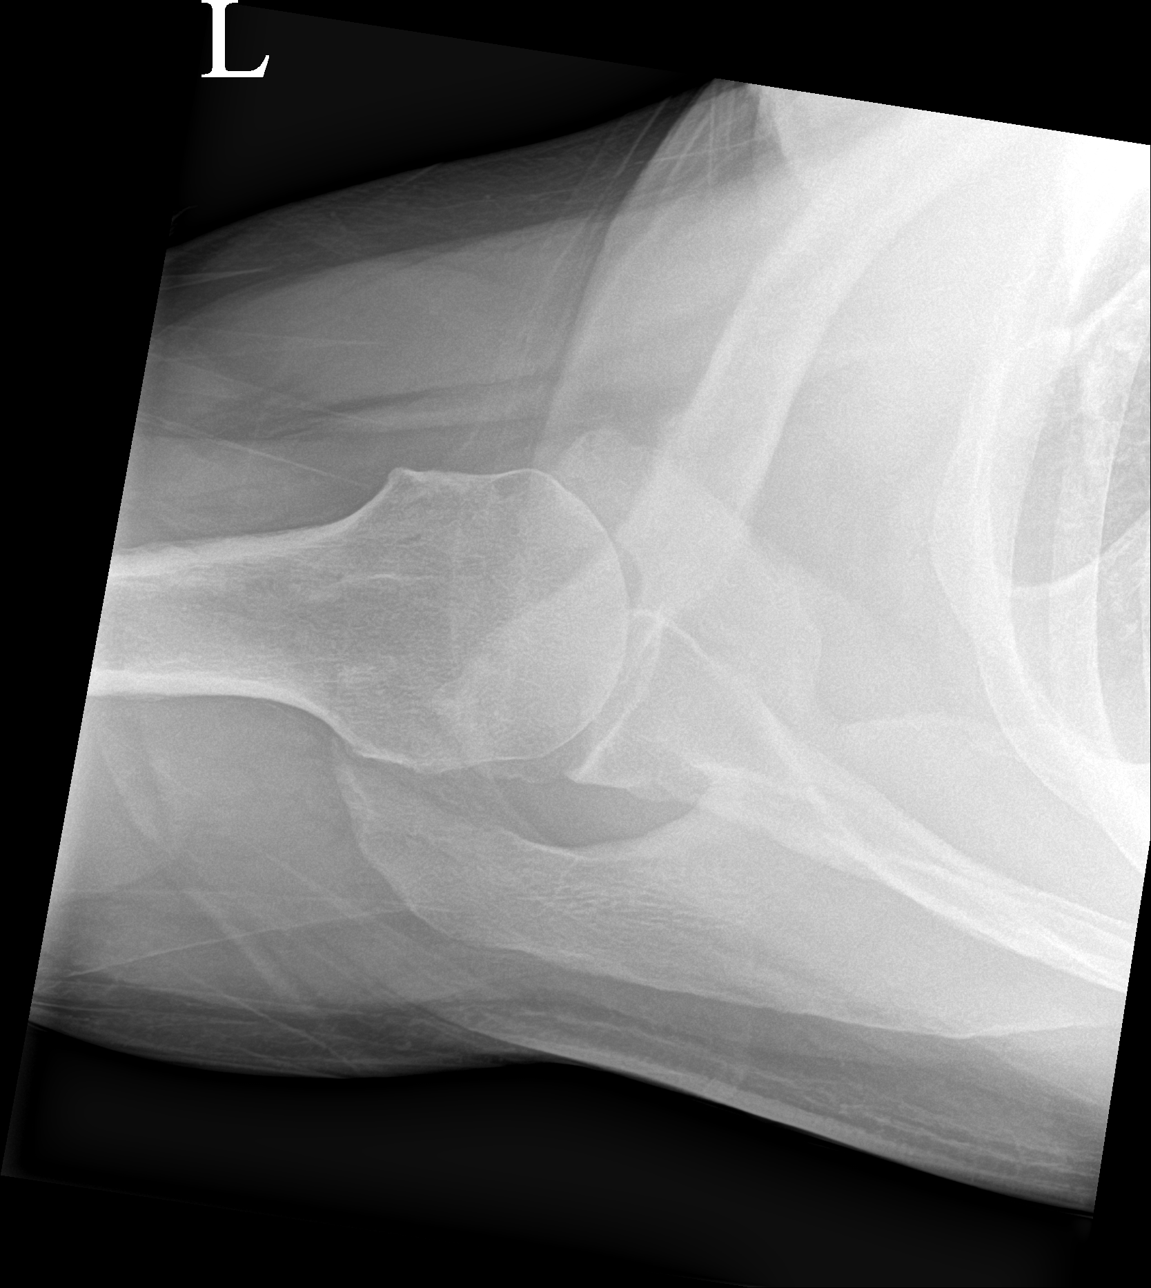

[3 of 3 positions shown; findings below may reference images not displayed]

FINDINGS: Minimal glenoid spur formation. Mild-to-moderate inferior AC joint
spur formation. The cortical margins of the inferior medial portions
of the scapula are not visualized on the lateral scapular image.
This area is obscured by the overlying structures of the chest on
the frontal view. The visualized portions of the scapula on the
axillary view appear normal.
IMPRESSION: 1. Possible bone erosion involving the medial aspect of the inferior
scapula. Further evaluation with a chest CT without contrast is
recommended.
2. Minimal glenohumeral and mild-to-moderate acromioclavicular joint
degenerative changes.

## 2022-10-13 ENCOUNTER — Other Ambulatory Visit: Payer: Self-pay | Admitting: Family Medicine

## 2022-11-03 ENCOUNTER — Other Ambulatory Visit: Payer: Self-pay | Admitting: Family Medicine

## 2022-11-12 ENCOUNTER — Encounter: Payer: Self-pay | Admitting: Internal Medicine

## 2022-11-17 DIAGNOSIS — D1801 Hemangioma of skin and subcutaneous tissue: Secondary | ICD-10-CM | POA: Diagnosis not present

## 2022-11-17 DIAGNOSIS — L814 Other melanin hyperpigmentation: Secondary | ICD-10-CM | POA: Diagnosis not present

## 2022-11-17 DIAGNOSIS — L570 Actinic keratosis: Secondary | ICD-10-CM | POA: Diagnosis not present

## 2022-11-17 DIAGNOSIS — L821 Other seborrheic keratosis: Secondary | ICD-10-CM | POA: Diagnosis not present

## 2022-11-25 DIAGNOSIS — H02831 Dermatochalasis of right upper eyelid: Secondary | ICD-10-CM | POA: Diagnosis not present

## 2022-11-25 DIAGNOSIS — E119 Type 2 diabetes mellitus without complications: Secondary | ICD-10-CM | POA: Diagnosis not present

## 2022-11-25 DIAGNOSIS — H02834 Dermatochalasis of left upper eyelid: Secondary | ICD-10-CM | POA: Diagnosis not present

## 2022-12-02 ENCOUNTER — Other Ambulatory Visit: Payer: Self-pay | Admitting: Family Medicine

## 2022-12-12 ENCOUNTER — Ambulatory Visit (AMBULATORY_SURGERY_CENTER): Payer: Self-pay

## 2022-12-12 VITALS — Ht 70.0 in | Wt 245.0 lb

## 2022-12-12 DIAGNOSIS — Z86010 Personal history of colonic polyps: Secondary | ICD-10-CM

## 2022-12-12 MED ORDER — NA SULFATE-K SULFATE-MG SULF 17.5-3.13-1.6 GM/177ML PO SOLN: 1.0000 | Freq: Once | ORAL | 0 refills | Status: AC

## 2022-12-12 NOTE — Progress Notes (Signed)

## 2022-12-18 ENCOUNTER — Other Ambulatory Visit: Payer: Self-pay | Admitting: Family Medicine

## 2023-01-12 ENCOUNTER — Encounter: Payer: Medicare Other | Admitting: Internal Medicine

## 2023-01-12 ENCOUNTER — Other Ambulatory Visit: Payer: Self-pay | Admitting: Family Medicine

## 2023-01-13 ENCOUNTER — Ambulatory Visit: Payer: Medicare Other | Admitting: Family Medicine

## 2023-01-28 ENCOUNTER — Other Ambulatory Visit: Payer: Self-pay | Admitting: Family Medicine

## 2023-01-29 ENCOUNTER — Encounter: Payer: Self-pay | Admitting: Internal Medicine

## 2023-02-05 ENCOUNTER — Encounter: Payer: Self-pay | Admitting: Internal Medicine

## 2023-02-05 ENCOUNTER — Ambulatory Visit (AMBULATORY_SURGERY_CENTER): Payer: Medicare Other | Admitting: Internal Medicine

## 2023-02-05 VITALS — BP 106/62 | HR 56 | Temp 98.4°F | Resp 15 | Ht 70.0 in | Wt 245.0 lb

## 2023-02-05 DIAGNOSIS — D122 Benign neoplasm of ascending colon: Secondary | ICD-10-CM

## 2023-02-05 DIAGNOSIS — D123 Benign neoplasm of transverse colon: Secondary | ICD-10-CM

## 2023-02-05 DIAGNOSIS — D124 Benign neoplasm of descending colon: Secondary | ICD-10-CM | POA: Diagnosis not present

## 2023-02-05 DIAGNOSIS — D125 Benign neoplasm of sigmoid colon: Secondary | ICD-10-CM | POA: Diagnosis not present

## 2023-02-05 DIAGNOSIS — Z09 Encounter for follow-up examination after completed treatment for conditions other than malignant neoplasm: Secondary | ICD-10-CM | POA: Diagnosis not present

## 2023-02-05 DIAGNOSIS — Z86010 Personal history of colonic polyps: Secondary | ICD-10-CM

## 2023-02-05 MED ORDER — SODIUM CHLORIDE 0.9 % IV SOLN
500.0000 mL | Freq: Once | INTRAVENOUS | Status: DC
Start: 2023-02-05 — End: 2023-02-05

## 2023-02-05 NOTE — Patient Instructions (Signed)
YOU HAD AN ENDOSCOPIC PROCEDURE TODAY AT THE [facility]:   Refer to the procedure report that was given to you for any specific questions about what was found during the examination.  If the procedure report does not answer your questions, please call your gastroenterologist to clarify.  If you requested that your care partner not be given the details of your procedure findings, then the procedure report has been included in a sealed envelope for you to review at your convenience later.  **Handouts given on polyps and diverticulosis**  YOU SHOULD EXPECT: Some feelings of bloating in the abdomen. Passage of more gas than usual.  Walking can help get rid of the air that was put into your GI tract during the procedure and reduce the bloating. If you had a lower endoscopy (such as a colonoscopy or flexible sigmoidoscopy) you may notice spotting of blood in your stool or on the toilet paper. If you underwent a bowel prep for your procedure, you may not have a normal bowel movement for a few days.  Please Note:  You might notice some irritation and congestion in your nose or some drainage.  This is from the oxygen used during your procedure.  There is no need for concern and it should clear up in a day or so.  SYMPTOMS TO REPORT IMMEDIATELY:  Following lower endoscopy (colonoscopy or flexible sigmoidoscopy):  Excessive amounts of blood in the stool  Significant tenderness or worsening of abdominal pains  Swelling of the abdomen that is new, acute  Fever of 100F or higher  For urgent or emergent issues, a gastroenterologist can be reached at any hour by calling [phone]. Do not use MyChart messaging for urgent concerns.    DIET:  We do recommend a small meal at first, but then you may proceed to your regular diet.  Drink plenty of fluids but you should avoid alcoholic beverages for 24 hours.  ACTIVITY:  You should plan to take it easy for the rest of today and you should NOT DRIVE  or use heavy machinery until tomorrow (because of the sedation medicines used during the test).    FOLLOW UP: Our staff will call the number listed on your records the next business day following your procedure.  We will call around 7:15- 8:00 am to check on you and address any questions or concerns that you may have regarding the information given to you following your procedure. If we do not reach you, we will leave a message.     If any biopsies were taken you will be contacted by phone or by letter within the next 1-3 weeks.  Please call us at [phone] if you have not heard about the biopsies in 3 weeks.    SIGNATURES/CONFIDENTIALITY: You and/or your care partner have signed paperwork which will be entered into your electronic medical record.  These signatures attest to the fact that that the information above on your After Visit Summary has been reviewed and is understood.  Full responsibility of the confidentiality of this discharge information lies with you and/or your care-partner. 

## 2023-02-05 NOTE — Progress Notes (Signed)
Report to PACU, RN, vss, BBS= Clear.  

## 2023-02-05 NOTE — Op Note (Signed)
[facility] Patient Name: Rodney Martinez Procedure Date: 02/05/2023 8:27 AM MRN: [id number] Endoscopist: Beverley Fiedler , MD, [id number] Age: 72 Referring MD:  Date of Birth: 09-Feb-1951 Gender: Male Account #: [id number] Procedure:                Colonoscopy Indications:              High risk colon cancer surveillance: Personal                            history of multiple adenomas, Last colonoscopy:                            February 2020 (11 TAs removed) Medicines:                Monitored Anesthesia Care Procedure:                Pre-Anesthesia Assessment:                           - Prior to the procedure, a History and Physical                            was performed, and patient medications and                            allergies were reviewed. The patient's tolerance of                            previous anesthesia was also reviewed. The risks                            and benefits of the procedure and the sedation                            options and risks were discussed with the patient.                            All questions were answered, and informed consent                            was obtained. Prior Anticoagulants: The patient has                            taken no anticoagulant or antiplatelet agents. ASA                            Grade Assessment: II - A patient with mild systemic                            disease. After reviewing the risks and benefits,                            the patient was deemed in satisfactory condition to  undergo the procedure.                           After obtaining informed consent, the colonoscope                            was passed under direct vision. Throughout the                            procedure, the patient's blood pressure, pulse, and                            oxygen saturations were monitored continuously. The                            CF [id number] #[id number] was introduced  through the anus                            and advanced to the cecum, identified by                            appendiceal orifice and ileocecal valve. The                            colonoscopy was performed without difficulty. The                            patient tolerated the procedure well. The quality                            of the bowel preparation was good. The ileocecal                            valve, appendiceal orifice, and rectum were                            photographed. Scope In: 8:37:19 AM Scope Out: 8:57:06 AM Scope Withdrawal Time: 0 hours 17 minutes 17 seconds  Total Procedure Duration: 0 hours 19 minutes 47 seconds  Findings:                 The digital rectal exam was normal.                           A 7 mm polyp was found in the ascending colon. The                            polyp was sessile. The polyp was removed with a                            cold snare. Resection and retrieval were complete.                           Three sessile polyps were found in the transverse  colon. The polyps were 3 to 6 mm in size. These                            polyps were removed with a cold snare. Resection                            and retrieval were complete.                           Four sessile polyps were found in the descending                            colon. The polyps were 3 to 5 mm in size. These                            polyps were removed with a cold snare. Resection                            and retrieval were complete.                           A 6 mm polyp was found in the sigmoid colon. The                            polyp was sessile. The polyp was removed with a                            cold snare. Resection and retrieval were complete.                           Multiple small-mouthed diverticula were found in                            the sigmoid colon and ascending colon.                           The retroflexed  view of the distal rectum and anal                            verge was normal and showed no anal or rectal                            abnormalities. Complications:            No immediate complications. Estimated Blood Loss:     Estimated blood loss was minimal. Impression:               - One 7 mm polyp in the ascending colon, removed                            with a cold snare. Resected and retrieved.                           -  Three 3 to 6 mm polyps in the transverse colon,                            removed with a cold snare. Resected and retrieved.                           - Four 3 to 5 mm polyps in the descending colon,                            removed with a cold snare. Resected and retrieved.                           - One 6 mm polyp in the sigmoid colon, removed with                            a cold snare. Resected and retrieved.                           - Mild diverticulosis in the sigmoid colon and in                            the ascending colon. Recommendation:           - Patient has a contact number available for                            emergencies. The signs and symptoms of potential                            delayed complications were discussed with the                            patient. Return to normal activities tomorrow.                            Written discharge instructions were provided to the                            patient.                           - Resume previous diet.                           - Continue present medications.                           - Await pathology results.                           - Repeat colonoscopy is recommended for                            surveillance. The colonoscopy date will be  determined after pathology results from today's                            exam become available for review. Beverley Fiedler, MD 02/05/2023 9:03:28 AM This report has been signed electronically.

## 2023-02-05 NOTE — Progress Notes (Signed)
Patient ID: Rodney Martinez, male   DOB: 1950/11/04, 72 y.o.   MRN: [id number]    GASTROENTEROLOGY PROCEDURE H&P NOTE   Primary Care Physician: Kristian Covey, MD    Reason for Procedure:   Hx of multiple adenomas  Plan:    colonoscopy  Patient is appropriate for endoscopic procedure(s) in the ambulatory ([facility]) setting.  The nature of the procedure, as well as the risks, benefits, and alternatives were carefully and thoroughly reviewed with the patient. Ample time for discussion and questions allowed. The patient understood, was satisfied, and agreed to proceed.     HPI: Rodney Martinez is a 72 y.o. male who presents for surveillance colonoscopy.  Medical history as below.  Tolerated the prep.  No recent chest pain or shortness of breath.  No abdominal pain today.  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   left upper back - tx p bx   SCC (squamous cell carcinoma) 09/22/2018   left upper outer eyelid - CX3 + 5FU   Sleep apnea    uses CPAP    Thyroid disease    TRANSAMINASES, SERUM, ELEVATED 12/12/2009    Past Surgical History:  Procedure Laterality Date   BASAL CELL CARCINOMA EXCISION  2007   face (Mohs)   CATARACT EXTRACTION Right 08/2019   COLONOSCOPY  04/15/2012   HERNIA REPAIR  1998   umbilical   POLYPECTOMY      Prior to Admission medications   Medication Sig Start Date End Date Taking? Authorizing Provider  Calcium-Magnesium-Vitamin D (CALCIUM 500 PO) Take 1 tablet by mouth at bedtime.    Yes [provider]  glucose blood (ACCU-CHEK GUIDE) test strip Test once daily.  Dx e11.9 12/18/17  Yes Burchette, Elberta Fortis, MD  JARDIANCE 10 MG TABS tablet Take 1 tablet by mouth once daily 12/19/22  Yes Burchette, Elberta Fortis, MD   levothyroxine (SYNTHROID) 25 MCG tablet TAKE 1 TABLET BY MOUTH ONCE DAILY BEFORE BREAKFAST . APPOINTMENT REQUIRED FOR FUTURE REFILLS 01/29/23  Yes Burchette, Elberta Fortis, MD  metFORMIN (GLUCOPHAGE-XR) 750 MG 24 hr tablet TAKE 1 TABLET BY MOUTH IN THE MORNING AND 1 TABLET AT BEDTIME 01/12/23  Yes Burchette, Elberta Fortis, MD  mycophenolate (CELLCEPT) 500 MG tablet TAKE 1 & 1/2 (ONE & ONE-HALF) TABLETS BY MOUTH TWICE DAILY 05/23/22  Yes Allena Katz, Donika K, DO  rosuvastatin (CRESTOR) 10 MG tablet Take 1 tablet by mouth once daily 11/04/22  Yes Burchette, Elberta Fortis, MD  tamsulosin (FLOMAX) 0.4 MG CAPS capsule Take 1 capsule by mouth once daily 12/02/22  Yes Burchette, Elberta Fortis, MD  Turmeric (QC TUMERIC COMPLEX) 500 MG CAPS Take 1,000 mg by mouth.   Yes [provider]  vitamin B-12 (CYANOCOBALAMIN) 500 MCG tablet Take 500 mcg by mouth daily.   Yes [provider]  Emollient (DERMEND BRUISE FORMULA EX) Apply topically 2 (two) times daily as needed.    [provider]  Melatonin 10 MG TABS Take 5 mg by mouth at bedtime.    [provider]    Current Outpatient Medications  Medication Sig Dispense Refill   Calcium-Magnesium-Vitamin D (CALCIUM 500 PO) Take 1 tablet by mouth at bedtime.      glucose blood (ACCU-CHEK GUIDE) test strip Test  once daily.  Dx e11.9 100 each 3   JARDIANCE 10 MG TABS tablet Take 1 tablet by mouth once daily 90 tablet 0   levothyroxine (SYNTHROID) 25 MCG tablet TAKE 1 TABLET BY MOUTH ONCE DAILY BEFORE BREAKFAST . APPOINTMENT REQUIRED FOR FUTURE REFILLS 30 tablet 1   metFORMIN (GLUCOPHAGE-XR) 750 MG 24 hr tablet TAKE 1 TABLET BY MOUTH IN THE MORNING AND 1 TABLET AT BEDTIME 180 tablet 0   mycophenolate (CELLCEPT) 500 MG tablet TAKE 1 & 1/2 (ONE & ONE-HALF) TABLETS BY MOUTH TWICE DAILY 270 tablet 3   rosuvastatin (CRESTOR) 10 MG tablet Take 1 tablet by mouth once daily 90 tablet 0   tamsulosin (FLOMAX) 0.4 MG CAPS capsule Take 1 capsule by mouth once daily 90 capsule  0   Turmeric (QC TUMERIC COMPLEX) 500 MG CAPS Take 1,000 mg by mouth.     vitamin B-12 (CYANOCOBALAMIN) 500 MCG tablet Take 500 mcg by mouth daily.     Emollient (DERMEND BRUISE FORMULA EX) Apply topically 2 (two) times daily as needed.     Melatonin 10 MG TABS Take 5 mg by mouth at bedtime.     Current Facility-Administered Medications  Medication Dose Route Frequency Provider Last Rate Last Admin   0.9 %  sodium chloride infusion  500 mL Intravenous Once Michaeljames Milnes, Carie Caddy, MD        Allergies as of 02/05/2023   (No Known Allergies)    Family History  Problem Relation Age of Onset   Diabetes Mother        type ll   Heart disease Father        ?atrial fibrillation   Diabetes Maternal Grandfather    Diabetes Paternal Grandmother    Diabetes Paternal Grandfather    Healthy Sister    Diabetes Brother    Healthy Daughter    Colon cancer Neg Hx    Stomach cancer Neg Hx    Colon polyps Neg Hx    Esophageal cancer Neg Hx    Rectal cancer Neg Hx     Social History   Socioeconomic History   Marital status: Married    Spouse name: Not on file   Number of children: 1   Years of education: vet school   Highest education level: Professional school degree (e.g., MD, DDS, DVM, JD)  Occupational History   Occupation: retired International aid/development worker  Tobacco Use   Smoking status: Every Day    Types: Cigars   Smokeless tobacco: Never   Tobacco comments:    Pt states that he smokes 6 cigars a day, he smoked cigarettes for 10 years.  Vaping Use   Vaping Use: Never used  Substance and Sexual Activity   Alcohol use: Not Currently    Comment: rare   Drug use: No   Sexual activity: Not on file  Other Topics Concern   Not on file  Social History Narrative   Lives with wife in a 2 story home.  Has one child. Retired International aid/development worker.     Right handed    Social Determinants of Health   Financial Resource Strain: Low Risk  (05/13/2021)   Overall Financial Resource Strain (CARDIA)    Difficulty of  Paying Living Expenses: Not hard at all  Food Insecurity: No Food Insecurity (05/13/2021)   Hunger Vital Sign    Worried About Running Out of Food in the Last Year: Never true    Ran Out of Food in the Last Year: Never true  Transportation Needs: No  Transportation Needs (05/13/2021)   PRAPARE - Administrator, Civil Service (Medical): No    Lack of Transportation (Non-Medical): No  Physical Activity: Inactive (05/13/2021)   Exercise Vital Sign    Days of Exercise per Week: 0 days    Minutes of Exercise per Session: 0 min  Stress: No Stress Concern Present (05/13/2021)   [employer] of Occupational Health - Occupational Stress Questionnaire    Feeling of Stress : Not at all  Social Connections: Moderately Isolated (05/13/2021)   Social Connection and Isolation Panel [NHANES]    Frequency of Communication with Friends and Family: Three times a week    Frequency of Social Gatherings with Friends and Family: Three times a week    Attends Religious Services: Never    Active Member of Clubs or Organizations: No    Attends Banker Meetings: Never    Marital Status: Married  Catering manager Violence: Not At Risk (05/13/2021)   Humiliation, Afraid, Rape, and Kick questionnaire    Fear of Current or Ex-Partner: No    Emotionally Abused: No    Physically Abused: No    Sexually Abused: No    Physical Exam: Vital signs in last 24 hours: @BP  121/68   Pulse (!) 58   Temp 98.4 F (36.9 C)   Ht 5\' 10"  (1.778 m)   Wt 245 lb (111.1 kg)   SpO2 97%   BMI 35.15 kg/m  GEN: NAD EYE: Sclerae anicteric ENT: MMM CV: Non-tachycardic Pulm: CTA b/l GI: Soft, NT/ND NEURO:  Alert & Oriented x 3   Erick Blinks, MD [facility]  02/05/2023 8:29 AM

## 2023-02-05 NOTE — Progress Notes (Signed)
Called to room to assist during endoscopic procedure.  Patient ID and intended procedure confirmed with present staff. Received instructions for my participation in the procedure from the performing physician.  

## 2023-02-06 ENCOUNTER — Telehealth: Payer: Self-pay

## 2023-02-06 NOTE — Telephone Encounter (Signed)
No answer on follow up call. No voicemail.  

## 2023-02-11 ENCOUNTER — Encounter: Payer: Self-pay | Admitting: Internal Medicine

## 2023-02-23 ENCOUNTER — Ambulatory Visit: Payer: Medicare Other | Admitting: Neurology

## 2023-02-23 ENCOUNTER — Encounter: Payer: Self-pay | Admitting: Neurology

## 2023-02-23 VITALS — BP 124/60 | HR 84 | Ht 70.0 in | Wt 241.0 lb

## 2023-02-23 DIAGNOSIS — G7000 Myasthenia gravis without (acute) exacerbation: Secondary | ICD-10-CM

## 2023-02-23 DIAGNOSIS — Z5181 Encounter for therapeutic drug level monitoring: Secondary | ICD-10-CM

## 2023-02-23 MED ORDER — MYCOPHENOLATE MOFETIL 500 MG PO TABS: ORAL_TABLET | ORAL | 3 refills | Status: DC

## 2023-02-23 NOTE — Patient Instructions (Signed)
Continue Cellcept 750mg  twice daily  We will check your labs today  I will see you back in 1 year

## 2023-02-23 NOTE — Progress Notes (Signed)
Follow-up Visit   Date: 02/23/2419    Rodney Martinez MRN: [id number] DOB: [birth year]    Rodney Martinez is a 72 y.o. right-handed Caucasian male with OSA, hypothyroidism, and diabetes mellitus returning to the clinic for follow-up of seropositive myasthenia gravis.  The patient was accompanied to the clinic by self.  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   Minimal evidence of disease or symptom manifestation (trace left ptosis). - 11/2017 presented with generalized weakness (severe) managed with IVIG and prednisone 60mg  - 01/2018 Cellcept added - 12/2018 Cellcept increaesd to 750mg  BID - 02/2019 tapered off IVIG - 11/2021 tapered off predisone - 05/2022 scheduled mestinon transitioned to prn  Continue Cellcept 750mg  twice daily OK to use mestinon 60mg  as needed Check CBC and and CMP  Return to clinic in 1 year  -------------------------------------------------------------------- History of present illness: Starting in mid January 2018, he developed slurred speech, difficulty swallowing, droopy eyelids, and weakness with moving his tongue. MRI brain was negative.  There was high clinical suspicion for myasthenia so he was started on mestinon 30mg  three times daily; in the interim, AChR antibodies returned positive.  He saw my colleague, Dr. Everlena Cooper initially who started him on prednisone 20mg  and increased mestinon to 60mg  three times daily and is here to establish care with me. Due to lack of improvement, his prednisone was titrated to 60mg  daily and IVIG was started in March 2018. Within a month, his dysphagia and double vision improved.  In May, slow prednisone taper was started.  He was noted to have low platelets (127) and referred to hematology for evaluation, especially since he was also started on Cellcept.  By September he was down to prednisone 20mg  and getting IVIG ever 3 weeks. Over 2019-2020, his IVIG has been tapered to  every 8 weeks and eventually stopped in May 2020.  His Cellcept was increased to 750mg  BID in March 2020.  He continues a slow prednisone taper and by early 2023 was able to completely taper off prednisone.    UPDATE 05/22/2022:   He has tapered off prednisone in early 2023 and doing great.  No double vision, difficulty with speech/swallow or limb weakness.  He is compliant with mestinon 60mg  BID and Cellcept 750mg  BID.   UPDATE 02/23/2023:  He is here for follow-up visit.  He has been doing great Cellcept 750mg  twice daily and has not needed to take any mestinon.  No problems with double visions, difficulty with swallow/speech, or limb weakness.  About a month ago, he was outside fertilizing and felt that his legs were not able to keep up so kept stumbling forward which resulted in a fall.  He felt like he lost balance.  There was no loss of consciousness.  He was eventually able to stand up himself.   Medications:  Current Outpatient Medications on File Prior to Visit  Medication Sig Dispense Refill   Calcium-Magnesium-Vitamin D (CALCIUM 500 PO) Take 1 tablet by mouth at bedtime.      Emollient (DERMEND BRUISE FORMULA EX) Apply topically 2 (two) times daily as needed.     glucose blood (ACCU-CHEK GUIDE) test strip Test once daily.  Dx e11.9 100 each 3   JARDIANCE 10 MG TABS tablet Take 1 tablet by mouth once daily 90 tablet 0   levothyroxine (SYNTHROID) 25 MCG tablet TAKE 1 TABLET BY MOUTH ONCE DAILY BEFORE BREAKFAST . APPOINTMENT REQUIRED FOR FUTURE REFILLS 30 tablet 1   Melatonin 10 MG TABS Take  5 mg by mouth at bedtime.     metFORMIN (GLUCOPHAGE-XR) 750 MG 24 hr tablet TAKE 1 TABLET BY MOUTH IN THE MORNING AND 1 TABLET AT BEDTIME 180 tablet 0   Multiple Vitamin (MULTIVITAMIN) tablet Take 1 tablet by mouth daily.     mycophenolate (CELLCEPT) 500 MG tablet TAKE 1 & 1/2 (ONE & ONE-HALF) TABLETS BY MOUTH TWICE DAILY 270 tablet 3   rosuvastatin (CRESTOR) 10 MG tablet Take 1 tablet by mouth once  daily 90 tablet 0   tamsulosin (FLOMAX) 0.4 MG CAPS capsule Take 1 capsule by mouth once daily 90 capsule 0   Turmeric (QC TUMERIC COMPLEX) 500 MG CAPS Take 1,000 mg by mouth.     vitamin B-12 (CYANOCOBALAMIN) 500 MCG tablet Take 500 mcg by mouth daily.     No current facility-administered medications on file prior to visit.    Allergies: No Known Allergies  Vital Signs:  BP 124/60   Pulse 84   Ht 5\' 10"  (1.778 m)   Wt 241 lb (109.3 kg)   SpO2 92%   BMI 34.58 kg/m     Neurological Exam: MENTAL STATUS including orientation to time, place, person, recent and remote memory, attention span and concentration, language, and fund of knowledge is normal.  Speech is normal, no dysarthria.    CRANIAL NERVES: Normal conjugate, extra-ocular eye movements in all directions of gaze. Trace left ptosis at rest, no worsening with sustained upgaze.  Face is symmetric.  Facial muscles are 5/5 throughout - Orbicularis oris and oculi is 5/5; buccinator 5/5.  Tongue strength is 5/5.    MOTOR:  Motor strength is 5/5 throughout. Neck flexion is 5/5.   COORDINATION/GAIT:  He is able to stand up without pushing off.   Gait narrow based and stable.  Data: AChR binding 1.01*, blocking 61*, modulating 25*  MRI brain wwo contrast 10/27/2017:  1. No acute intracranial process or abnormal enhancement. 2. Mild parenchymal brain volume loss for age.  CT chest w contrast 02/03/2018: No acute chest abnormality. Specifically, no evidence for a mediastinal mass or abnormal thymic tissue. Probable scarring in the left lower lung. Indeterminate 4 mm nodular density in the right middle lobe. No follow-up needed if patient is low-risk. Non-contrast chest CT can be considered in 12 months if patient is high-risk.   Lab Results  Component Value Date   WBC 7.4 01/08/2022   HGB 15.0 01/08/2022   HCT 45.2 01/08/2022   MCV 93.5 01/08/2022   PLT 167.0 01/08/2022   Lab Results  Component Value Date   CREATININE 0.94  01/08/2022   BUN 20 01/08/2022   NA 139 01/08/2022   K 4.7 01/08/2022   CL 106 01/08/2022   CO2 25 01/08/2022   Lab Results  Component Value Date   HGBA1C 6.6 (A) 07/14/2022   Lab Results  Component Value Date   VITAMINB12 498 07/08/2019      Thank you for allowing me to participate in patient's care.  If I can answer any additional questions, I would be pleased to do so.    Sincerely,    Ellayna Hilligoss K. Allena Katz, DO

## 2023-02-27 ENCOUNTER — Other Ambulatory Visit: Payer: Self-pay | Admitting: Family Medicine

## 2023-03-15 ENCOUNTER — Other Ambulatory Visit: Payer: Self-pay | Admitting: Family Medicine

## 2023-03-16 ENCOUNTER — Ambulatory Visit (INDEPENDENT_AMBULATORY_CARE_PROVIDER_SITE_OTHER): Payer: Medicare Other | Admitting: Family Medicine

## 2023-03-16 VITALS — BP 118/60 | HR 55 | Temp 98.6°F | Ht 70.0 in | Wt 242.5 lb

## 2023-03-16 DIAGNOSIS — E039 Hypothyroidism, unspecified: Secondary | ICD-10-CM

## 2023-03-16 DIAGNOSIS — Z79899 Other long term (current) drug therapy: Secondary | ICD-10-CM | POA: Diagnosis not present

## 2023-03-16 DIAGNOSIS — E1165 Type 2 diabetes mellitus with hyperglycemia: Secondary | ICD-10-CM | POA: Diagnosis not present

## 2023-03-16 DIAGNOSIS — E785 Hyperlipidemia, unspecified: Secondary | ICD-10-CM

## 2023-03-16 DIAGNOSIS — G7000 Myasthenia gravis without (acute) exacerbation: Secondary | ICD-10-CM

## 2023-03-16 DIAGNOSIS — Z5181 Encounter for therapeutic drug level monitoring: Secondary | ICD-10-CM

## 2023-03-16 DIAGNOSIS — Z7984 Long term (current) use of oral hypoglycemic drugs: Secondary | ICD-10-CM

## 2023-03-16 LAB — LIPID PANEL
Cholesterol: 113 mg/dL (ref 0–200)
HDL: 45.3 mg/dL (ref >39.00)
LDL Cholesterol: 45 mg/dL (ref 0–99)
NonHDL: 67.34
Total CHOL/HDL Ratio: 2
Triglycerides: 110 mg/dL (ref 0.0–149.0)
VLDL: 22 mg/dL (ref 0.0–40.0)

## 2023-03-16 LAB — COMPREHENSIVE METABOLIC PANEL
ALT: 24 U/L (ref 0–53)
AST: 17 U/L (ref 0–37)
Albumin: 4.6 g/dL (ref 3.5–5.2)
Alkaline Phosphatase: 41 U/L (ref 39–117)
BUN: 19 mg/dL (ref 6–23)
CO2: 25 mEq/L (ref 19–32)
Calcium: 9.2 mg/dL (ref 8.4–10.5)
Chloride: 106 mEq/L (ref 96–112)
Creatinine, Ser: 0.86 mg/dL (ref 0.40–1.50)
GFR: 85.34 mL/min (ref >60.00)
Glucose, Bld: 107 mg/dL — ABNORMAL HIGH (ref 70–99)
Potassium: 4.1 mEq/L (ref 3.5–5.1)
Sodium: 140 mEq/L (ref 135–145)
Total Bilirubin: 0.4 mg/dL (ref 0.2–1.2)
Total Protein: 6.8 g/dL (ref 6.0–8.3)

## 2023-03-16 LAB — CBC WITH DIFFERENTIAL/PLATELET
Basophils Absolute: 0 10*3/uL (ref 0.0–0.1)
Basophils Relative: 0.3 % (ref 0.0–3.0)
Eosinophils Absolute: 0.2 10*3/uL (ref 0.0–0.7)
Eosinophils Relative: 2.3 % (ref 0.0–5.0)
HCT: 44.2 % (ref 39.0–52.0)
Hemoglobin: 14.5 g/dL (ref 13.0–17.0)
Lymphocytes Relative: 46.7 % — ABNORMAL HIGH (ref 12.0–46.0)
Lymphs Abs: 3.6 10*3/uL (ref 0.7–4.0)
MCHC: 32.9 g/dL (ref 30.0–36.0)
MCV: 94.3 fl (ref 78.0–100.0)
Monocytes Absolute: 0.4 10*3/uL (ref 0.1–1.0)
Monocytes Relative: 5.7 % (ref 3.0–12.0)
Neutro Abs: 3.5 10*3/uL (ref 1.4–7.7)
Neutrophils Relative %: 45 % (ref 43.0–77.0)
Platelets: 174 10*3/uL (ref 150.0–400.0)
RBC: 4.69 Mil/uL (ref 4.22–5.81)
RDW: 13.1 % (ref 11.5–15.5)
WBC: 7.7 10*3/uL (ref 4.0–10.5)

## 2023-03-16 LAB — POCT GLYCOSYLATED HEMOGLOBIN (HGB A1C): Hemoglobin A1C: 6.5 % — AB (ref 4.0–5.6)

## 2023-03-16 LAB — TSH: TSH: 4.38 u[IU]/mL (ref 0.35–5.50)

## 2023-03-16 NOTE — Progress Notes (Signed)
Established Patient Office Visit  Subjective   Patient ID: Rodney Martinez, male    DOB: 1951/05/26  Age: 72 y.o. MRN: [id number]  No chief complaint on file.   HPI   [name] is here for medical follow-up.  He has history of obstructive sleep apnea, hypothyroidism, type 2 diabetes, myasthenia gravis, BPH, dyslipidemia, and tubular adenomas.  He had recent colonoscopy with removal of adenomas.  Recommended 3-year follow-up.  His myasthenia is stable.  Followed regularly by neurology.  Does have some general fatigue and not sure how much of this is age-related.  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  Hypothyroidism treated with levothyroxine 25 mcg daily.  Overdue for follow-up labs.  He has diabetes treated with Jardiance 10 mg daily and metformin extended release 750 mg twice daily.  Tolerating medications well.  Takes rosuvastatin 10 mg once daily for hyperlipidemia.  Recent LDL cholesterol 48.  Denies recent chest pains.  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   left upper back - tx p bx   SCC (squamous cell carcinoma) 09/22/2018   left upper outer eyelid - CX3 + 5FU   Sleep apnea    uses CPAP    Thyroid disease    TRANSAMINASES, SERUM, ELEVATED 12/12/2009   Past Surgical History:  Procedure Laterality Date   BASAL CELL CARCINOMA EXCISION  2007   face (Mohs)   CATARACT EXTRACTION Right 08/2019   COLONOSCOPY  04/15/2012   HERNIA REPAIR  1998   umbilical   POLYPECTOMY      reports that he has been smoking cigars. He has never used smokeless tobacco. He reports that he does not currently use alcohol. He reports that he does not use drugs. family history includes Diabetes in his brother, maternal grandfather, mother, paternal  grandfather, and paternal grandmother; Healthy in his daughter and sister; Heart disease in his father. No Known Allergies  Review of Systems  Constitutional:  Positive for malaise/fatigue. Negative for chills and fever.  Eyes:  Negative for blurred vision.  Respiratory:  Negative for shortness of breath.   Cardiovascular:  Negative for chest pain.  Gastrointestinal:  Negative for abdominal pain.  Neurological:  Negative for dizziness, weakness and headaches.      Objective:     BP 118/60 (BP Location: Left Arm, Patient Position: Sitting, Cuff Size: Large)   Pulse (!) 55   Temp 98.6 F (37 C) (Oral)   Ht 5\' 10"  (1.778 m)   Wt 242 lb 8 oz (110 kg)   SpO2 97%   BMI 34.80 kg/m       Results for orders placed or performed in visit on 03/16/23  POC HgB A1c  Result Value Ref Range   Hemoglobin A1C 6.5 (A) 4.0 - 5.6 %   HbA1c POC (<> result, manual entry)     HbA1c, POC (prediabetic range)     HbA1c, POC (controlled diabetic range)        The ASCVD Risk score (Arnett DK, et al., 2019) failed to calculate for the following reasons:   The valid total cholesterol range is 130 to 320 mg/dL    Assessment & Plan:   Problem List Items Addressed This Visit  Unprioritized   Dyslipidemia - Primary   Relevant Orders   Lipid panel   CMP   Hypothyroid   Relevant Orders   TSH   Type 2 diabetes mellitus with hyperglycemia (HCC)   Relevant Orders   POC HgB A1c (Completed)   CBC with Differential/Platelet   Other Visit Diagnoses     High risk medication use       Relevant Orders   CBC with Differential/Platelet   Myasthenia gravis without (acute) exacerbation (HCC)       Medication monitoring encounter         -Type 2 diabetes well-controlled with A1c today 6.5%.  Continue metformin extended release and Jardiance.  Continue yearly diabetic eye exam.  -Hypothyroidism treated with low-dose levothyroxine.  Recheck TSH today  -Hyperlipidemia treated with  rosuvastatin.  Check lipid and CMP  -Set up 41-month follow-up and sooner as needed  Return in about 6 months (around 09/15/2023).    Evelena Peat, MD

## 2023-03-24 ENCOUNTER — Other Ambulatory Visit: Payer: Self-pay | Admitting: Family Medicine

## 2023-04-08 ENCOUNTER — Other Ambulatory Visit: Payer: Self-pay | Admitting: Family Medicine

## 2023-05-23 ENCOUNTER — Other Ambulatory Visit: Payer: Self-pay | Admitting: Family Medicine

## 2023-07-03 ENCOUNTER — Other Ambulatory Visit: Payer: Self-pay | Admitting: Family Medicine

## 2023-07-16 ENCOUNTER — Telehealth: Payer: Self-pay | Admitting: Neurology

## 2023-07-16 MED ORDER — MYCOPHENOLATE MOFETIL 500 MG PO TABS: ORAL_TABLET | ORAL | 3 refills | Status: DC

## 2023-07-16 NOTE — Telephone Encounter (Signed)
[facility] [address] called stating that the RX needs to be sent with DX code for the medication Mycophenolate 500mg 

## 2023-07-16 NOTE — Telephone Encounter (Signed)
Done

## 2023-08-03 ENCOUNTER — Other Ambulatory Visit: Payer: Self-pay | Admitting: Family Medicine

## 2023-08-12 ENCOUNTER — Other Ambulatory Visit (HOSPITAL_COMMUNITY): Payer: Self-pay

## 2023-08-13 ENCOUNTER — Telehealth: Payer: Self-pay | Admitting: Pharmacy Technician

## 2023-08-13 NOTE — Telephone Encounter (Signed)
Pharmacy Patient Advocate Encounter   Received notification from CoverMyMeds that prior authorization for MYCOPHENOLATE 250MG  is required/requested.   Insurance verification completed.   The patient is insured through [facility] .   Per test claim: PA required; PA submitted to above mentioned insurance via CoverMyMeds Key/confirmation #/EOC [facility] Status is pending

## 2023-08-20 ENCOUNTER — Other Ambulatory Visit: Payer: Self-pay | Admitting: Family Medicine

## 2023-08-27 DIAGNOSIS — G4733 Obstructive sleep apnea (adult) (pediatric): Secondary | ICD-10-CM | POA: Diagnosis not present

## 2023-08-31 NOTE — Telephone Encounter (Signed)
Clinical questions have been answered and PA submitted. PA currently Pending.

## 2023-09-07 ENCOUNTER — Other Ambulatory Visit: Payer: Self-pay | Admitting: Family Medicine

## 2023-09-18 ENCOUNTER — Other Ambulatory Visit: Payer: Self-pay | Admitting: Family Medicine

## 2023-09-21 ENCOUNTER — Other Ambulatory Visit (HOSPITAL_COMMUNITY): Payer: Self-pay

## 2023-09-21 ENCOUNTER — Telehealth: Payer: Self-pay | Admitting: Pharmacy Technician

## 2023-09-21 ENCOUNTER — Telehealth: Payer: Self-pay | Admitting: Neurology

## 2023-09-21 NOTE — Telephone Encounter (Signed)
PA returned N/A.   New Encounter created for follow up. For additional info see Pharmacy Prior Auth telephone encounter from 09/21/23.

## 2023-09-21 NOTE — Telephone Encounter (Signed)
Caller stated he was told by pharmacy to contact us due to issue with mycophenolate (CELLCEPT) 500 MG [facility] [address]

## 2023-09-21 NOTE — Telephone Encounter (Signed)
There is a PA pending. Will check for update

## 2023-09-21 NOTE — Telephone Encounter (Addendum)
Pharmacy Patient Advocate Encounter   Received notification from Pt Calls Messages that prior authorization for Mycophenolate Mofetil 500MG  is required/requested.   Insurance verification completed.   The patient is insured through [employer] .   Per test claim: PA required; PA submitted to above mentioned insurance via CoverMyMeds Key/confirmation #/EOC [id number] Status is pending   PA Case ID #: [id number]

## 2023-09-22 NOTE — Telephone Encounter (Signed)
Update on determination still pending

## 2023-09-23 ENCOUNTER — Other Ambulatory Visit (HOSPITAL_COMMUNITY): Payer: Self-pay

## 2023-09-23 NOTE — Telephone Encounter (Signed)
PA has been approved. Please check 12.16.24 encounter for updates

## 2023-09-23 NOTE — Telephone Encounter (Signed)
Pharmacy Patient Advocate Encounter  Received notification from [facility] MEDICARE  that Prior Authorization for MYCOPHENOLATE 500MG  has been APPROVED from 12.17.24 to 12.16.25. Ran test claim, Copay is $25.25. This test claim was processed through [facility]- copay amounts may vary at other pharmacies due to pharmacy/plan contracts, or as the patient moves through the different stages of their insurance plan.

## 2023-09-24 ENCOUNTER — Telehealth: Payer: Self-pay | Admitting: Neurology

## 2023-09-24 NOTE — Telephone Encounter (Signed)
I have called patient and left a detailed message that PA has been approved.

## 2023-09-24 NOTE — Telephone Encounter (Signed)
Called patient and left a detailed message per DPR that Cellcept has been approved and he may call his pharmacy to have medication filled.

## 2023-09-24 NOTE — Telephone Encounter (Signed)
Called patients pharmacy and they submitted the Cellcept and it went through. They are processing the refill. Cost is $36.26 for a 90 day supply.   Called patient and informed him of above and he thanked me for the help and call.

## 2023-09-24 NOTE — Telephone Encounter (Signed)
Left a message on the VM stating he is trouble getting his medication  cellcept  please all

## 2023-09-26 DIAGNOSIS — G4733 Obstructive sleep apnea (adult) (pediatric): Secondary | ICD-10-CM | POA: Diagnosis not present

## 2023-09-29 ENCOUNTER — Other Ambulatory Visit: Payer: Self-pay | Admitting: Family Medicine

## 2023-10-01 ENCOUNTER — Ambulatory Visit: Payer: Self-pay | Admitting: Family Medicine

## 2023-10-01 NOTE — Telephone Encounter (Signed)
  Chief Complaint: abscess Symptoms: red, swollen, draining pus,  Frequency: present for six months, opened two days ago Pertinent Negatives: Patient denies streaking, warmth, fever Disposition: [] ED /[x] Urgent Care (no appt availability in office) / [] Appointment(In office/virtual)/ []  [facility]/ [] Home Care/ [] Refused Recommended Disposition /[] [facility]/ []  Follow-up with PCP Additional Notes: Pt called stating he had a cyst on the R side of his neck for approx 6 months that opened two days ago. He states it became soft and he squeezed it. States there is pus draining from the area, redness, and the wound is open- approx the size of the thumb. States he has taken ibuprofen for pain control, denies pain at this time. Denies fever, streaking, warmth to touch. Per protocol, pt to be evaluated within 24 hours. Next available appt with any provider 10/05/23 @ 0815. Upgraded to [facility]- unable to schedule pt today as all slots full. Pt given information for [facility] and [facility], states he will walk in for eval today. Care advice reviewed, denies further questions. Alerting PCP for review.   Copied from CRM [id number]. Topic: Clinical - Medical Advice >> Oct 01, 2023  9:16 AM Chantha C wrote: Reason for CRM: Abcess on neck ruptured, oozing pus, red, painful to the touch, denies no fever or larger in size. Pls would like advise, and c/b [phone].  No available appt today, triage nurse call for consult. Reason for Disposition  [1] Boil > 1/2 inch across (> 12 mm; larger than a marble) AND [2] center is soft or pus colored  Answer Assessment - Initial Assessment Questions 1. APPEARANCE of BOIL: "What does the boil look like?"      Red, raised, open and draining,  2. LOCATION: "Where is the boil located?"      R neck 3. NUMBER: "How many boils are there?"      1 4. SIZE: "How big is the boil?" (e.g., inches, cm; compare to size of a coin or other object)     Thumb  size 5. ONSET: "When did the boil start?"     Been present approx 6 months, opened 2 days ago  6. PAIN: "Is there any pain?" If Yes, ask: "How bad is the pain?"   (Scale 1-10; or mild, moderate, severe)     Denies 7. FEVER: "Do you have a fever?" If Yes, ask: "What is it, how was it measured, and when did it start?"      Unsure, but does not think so 8. SOURCE: "Have you been around anyone with boils or other Staph infections?" "Have you ever had boils before?"     Denies 9. OTHER SYMPTOMS: "Do you have any other symptoms?" (e.g., shaking chills, weakness, rash elsewhere on body)     Denies  Protocols used: Boil (Skin Abscess)-A-AH

## 2023-10-02 ENCOUNTER — Ambulatory Visit
Admission: RE | Admit: 2023-10-02 | Discharge: 2023-10-02 | Disposition: A | Discharge status: Home / Self Care | Payer: Medicare Other | Source: Ambulatory Visit | Attending: Family Medicine

## 2023-10-02 VITALS — BP 124/80 | HR 63 | Temp 98.3°F | Resp 18

## 2023-10-02 DIAGNOSIS — L723 Sebaceous cyst: Secondary | ICD-10-CM

## 2023-10-02 DIAGNOSIS — L089 Local infection of the skin and subcutaneous tissue, unspecified: Secondary | ICD-10-CM | POA: Diagnosis not present

## 2023-10-02 MED ORDER — DOXYCYCLINE HYCLATE 100 MG PO CAPS: 100.0000 mg | ORAL_CAPSULE | Freq: Two times a day (BID) | ORAL | 0 refills | Status: DC

## 2023-10-02 NOTE — Discharge Instructions (Signed)
Take the full course of antibiotics, apply warm compresses, keep the area clean and covered.  Call primary care or dermatology and see about scheduling an excision procedure of the cyst

## 2023-10-02 NOTE — ED Provider Notes (Signed)
[facility]    CSN: [id number] Arrival date & time: 10/02/23  1804      History   Chief Complaint No chief complaint on file.   HPI Rodney Martinez is a 72 y.o. male.   Patient presenting today with a mass to the right side of neck for the past 6 months or so that has become larger, painful and draining over the past few days.  Has been using Neosporin and cleaning with peroxide with minimal relief.  Denies fever, chills, body aches, nausea, diaphoresis.    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   Hypothyroid 04/05/2019   Easy bruising 04/19/2018   BPH (benign prostatic hyperplasia) 02/01/2018   Bradycardia 12/31/2017   Myasthenia gravis with acute exacerbation (HCC) 12/02/2017   Tubular adenoma 11/02/2017   Slurred speech 10/27/2017   OBSTRUCTIVE SLEEP APNEA 08/14/2010   GERD 12/12/2009   Type 2 diabetes mellitus with hyperglycemia (HCC) 12/12/2009   TRANSAMINASES, SERUM, ELEVATED 12/12/2009    Past Surgical History:  Procedure Laterality Date   BASAL CELL CARCINOMA EXCISION  2007   face (Mohs)   CATARACT EXTRACTION Right 08/2019   COLONOSCOPY  04/15/2012   HERNIA REPAIR  1998   umbilical   POLYPECTOMY         Home Medications    Prior to Admission medications   Medication Sig Start Date End Date Taking? Authorizing Provider  doxycycline (VIBRAMYCIN) 100 MG capsule Take 1 capsule (100 mg  total) by mouth 2 (two) times daily. 10/02/23  Yes Particia Nearing, PA-C  Calcium-Magnesium-Vitamin D (CALCIUM 500 PO) Take 1 tablet by mouth at bedtime.     [provider]  Emollient (DERMEND BRUISE FORMULA EX) Apply topically 2 (two) times daily as needed.    [provider]  glucose blood (ACCU-CHEK GUIDE) test strip Test once daily.  Dx e11.9 12/18/17   Kristian Covey, MD  JARDIANCE 10 MG TABS tablet Take 1 tablet by mouth once daily 09/08/23   Burchette, Elberta Fortis, MD  levothyroxine (SYNTHROID) 25 MCG tablet TAKE 1 TABLET BY MOUTH ONCE DAILY WITH BREAKFAST . APPOINTMENT REQUIRED FOR FUTURE REFILLS 09/18/23   Kristian Covey, MD  Melatonin 10 MG TABS Take 5 mg by mouth at bedtime.    [provider]  metFORMIN (GLUCOPHAGE-XR) 750 MG 24 hr tablet TAKE 1 TABLET BY MOUTH IN THE MORNING AND 1 TABLET AT BEDTIME 09/29/23   Burchette, Elberta Fortis, MD  Multiple Vitamin (MULTIVITAMIN) tablet Take 1 tablet by mouth daily.    [provider]  mycophenolate (CELLCEPT) 500 MG tablet TAKE 1 & 1/2 (ONE & ONE-HALF) TABLETS BY MOUTH TWICE DAILY 07/16/23   Nita Sickle K, DO  rosuvastatin (CRESTOR) 10 MG tablet Take 1 tablet by mouth once daily 08/04/23   Burchette, Elberta Fortis, MD  tamsulosin [facility]) 0.4  MG CAPS capsule Take 1 capsule by mouth once daily 08/20/23   Burchette, Elberta Fortis, MD  Turmeric (QC TUMERIC COMPLEX) 500 MG CAPS Take 1,000 mg by mouth.    [provider]  vitamin B-12 (CYANOCOBALAMIN) 500 MCG tablet Take 500 mcg by mouth daily.    [provider]    Family History Family History  Problem Relation Age of Onset   Diabetes Mother        type ll   Heart disease Father        ?atrial fibrillation   Diabetes Maternal Grandfather    Diabetes Paternal Grandmother    Diabetes Paternal Grandfather    Healthy Sister    Diabetes Brother    Healthy Daughter    Colon cancer Neg Hx    Stomach cancer Neg Hx    Colon polyps Neg Hx     Esophageal cancer Neg Hx    Rectal cancer Neg Hx     Social History Social History   Tobacco Use   Smoking status: Every Day    Types: Cigars   Smokeless tobacco: Never   Tobacco comments:    Pt states that he smokes 6 cigars a day, he smoked cigarettes for 10 years.  Vaping Use   Vaping status: Never Used  Substance Use Topics   Alcohol use: Not Currently    Comment: rare   Drug use: No     Allergies   Patient has no known allergies.   Review of Systems Review of Systems Per HPI  Physical Exam Triage Vital Signs ED Triage Vitals  Encounter Vitals Group     BP 10/02/23 1821 124/80     Systolic BP Percentile --      Diastolic BP Percentile --      Pulse Rate 10/02/23 1821 63     Resp 10/02/23 1821 18     Temp 10/02/23 1821 98.3 F (36.8 C)     Temp Source 10/02/23 1821 Oral     SpO2 10/02/23 1821 94 %     Weight --      Height --      Head Circumference --      Peak Flow --      Pain Score 10/02/23 1823 0     Pain Loc --      Pain Education --      Exclude from Growth Chart --    No data found.  Updated Vital Signs BP 124/80 (BP Location: Right Arm)   Pulse 63   Temp 98.3 F (36.8 C) (Oral)   Resp 18   SpO2 94%   Visual Acuity Right Eye Distance:   Left Eye Distance:   Bilateral Distance:    Right Eye Near:   Left Eye Near:    Bilateral Near:     Physical Exam Vitals and nursing note reviewed.  Constitutional:      Appearance: Normal appearance.  HENT:     Head: Atraumatic.     Mouth/Throat:     Mouth: Mucous membranes are moist.  Eyes:     Extraocular Movements: Extraocular movements intact.     Conjunctiva/sclera: Conjunctivae normal.  Cardiovascular:     Rate and Rhythm: Normal rate and regular rhythm.  Pulmonary:     Effort: Pulmonary effort is normal.     Breath sounds: Normal breath sounds.  Musculoskeletal:        General: Normal range of motion.     Cervical back: Normal range of motion  and neck supple.  Skin:     General: Skin is warm.     Comments: 1.5 cm infected sebaceous cyst to the right side of neck.  Not actively draining.  Tender to palpation.  Neurological:     General: No focal deficit present.     Mental Status: He is oriented to person, place, and time.     Motor: No weakness.     Gait: Gait normal.  Psychiatric:        Mood and Affect: Mood normal.        Thought Content: Thought content normal.        Judgment: Judgment normal.      UC Treatments / Results  Labs (all labs ordered are listed, but only abnormal results are displayed) Labs Reviewed - No data to display  EKG   Radiology No results found.  Procedures Procedures (including critical care time)  Medications Ordered in UC Medications - No data to display  Initial Impression / Assessment and Plan / UC Course  I have reviewed the triage vital signs and the nursing notes.  Pertinent labs & imaging results that were available during my care of the patient were reviewed by me and considered in my medical decision making (see chart for details).     Treat with doxycycline, warm compresses, over-the-counter pain relievers and schedule cyst excision procedure with primary care.  Return for worsening symptoms.  Final Clinical Impressions(s) / UC Diagnoses   Final diagnoses:  Infected sebaceous cyst     Discharge Instructions      Take the full course of antibiotics, apply warm compresses, keep the area clean and covered.  Call primary care or dermatology and see about scheduling an excision procedure of the cyst    ED Prescriptions     Medication Sig Dispense Auth. Provider   doxycycline (VIBRAMYCIN) 100 MG capsule Take 1 capsule (100 mg total) by mouth 2 (two) times daily. 14 capsule Particia Nearing, [place]      PDMP not reviewed this encounter.   Particia Nearing, [place] 10/02/23 1911

## 2023-10-02 NOTE — ED Triage Notes (Signed)
Sore on right side of neck that is draining since 12/23.  States knot has been there for 6 months.  Has been using neosporin and peroxide.

## 2023-10-24 ENCOUNTER — Other Ambulatory Visit: Payer: Self-pay | Admitting: Family Medicine

## 2023-10-26 DIAGNOSIS — R208 Other disturbances of skin sensation: Secondary | ICD-10-CM | POA: Diagnosis not present

## 2023-10-26 DIAGNOSIS — L728 Other follicular cysts of the skin and subcutaneous tissue: Secondary | ICD-10-CM | POA: Diagnosis not present

## 2023-10-26 DIAGNOSIS — L820 Inflamed seborrheic keratosis: Secondary | ICD-10-CM | POA: Diagnosis not present

## 2023-10-26 DIAGNOSIS — L2989 Other pruritus: Secondary | ICD-10-CM | POA: Diagnosis not present

## 2023-10-26 DIAGNOSIS — Z789 Other specified health status: Secondary | ICD-10-CM | POA: Diagnosis not present

## 2023-10-27 DIAGNOSIS — G4733 Obstructive sleep apnea (adult) (pediatric): Secondary | ICD-10-CM | POA: Diagnosis not present

## 2023-11-03 ENCOUNTER — Ambulatory Visit (INDEPENDENT_AMBULATORY_CARE_PROVIDER_SITE_OTHER): Payer: Medicare Other | Admitting: Family Medicine

## 2023-11-03 DIAGNOSIS — Z0000 Encounter for general adult medical examination without abnormal findings: Secondary | ICD-10-CM | POA: Diagnosis not present

## 2023-11-03 NOTE — Patient Instructions (Signed)
I really enjoyed getting to talk with you today! I am available on Tuesdays and Thursdays for virtual visits if you have any questions or concerns, or if I can be of any further assistance.   CHECKLIST FROM ANNUAL WELLNESS VISIT:  -Follow up (please call to schedule if not scheduled after visit):   -yearly for annual wellness visit with primary care office  Here is a list of your preventive care/health maintenance measures and the plan for each if any are due:  PLAN For any measures below that may be due:   Health Maintenance  Topic Date Due   Zoster Vaccines- Shingrix (1 of 2) 10/07/1969, can get at pharmacy, let us know if you do.   DTaP/Tdap/Td (2 - Tdap) 12/13/2019, can get at pharmacy, let us know if you do.   COVID-19 Vaccine (3 - Moderna risk series) 01/11/2020, can get at pharmacy, let us know if you do.   Diabetic kidney evaluation - Urine ACR  09/25/2021, can get at follow up visit with Dr. B   INFLUENZA VACCINE  05/07/2023, can get at pharmacy, let us know if you do.   FOOT EXAM  07/15/2023, please request at office visit   HEMOGLOBIN A1C  09/15/2023, ca get at office visit with Dr. Leonard Schwartz   OPHTHALMOLOGY EXAM  11/26/2023 - schedule your annual eye exam   Diabetic kidney evaluation - eGFR measurement  03/15/2024   Medicare Annual Wellness (AWV)  11/02/2024   Colonoscopy  02/04/2026   Pneumonia Vaccine 40+ Years old  Completed   Hepatitis C Screening  Completed   HPV VACCINES  Aged Out    -See a dentist at least yearly  -Get your eyes checked and then per your eye specialist's recommendations  -Other issues addressed today:   -I have included below further information regarding a healthy whole foods based diet, physical activity guidelines for adults, stress management and opportunities for social connections. I hope you find this information useful.    -----------------------------------------------------------------------------------------------------------------------------------------------------------------------------------------------------------------------------------------------------------  NUTRITION: -eat real food: lots of colorful vegetables (half the plate) and fruits -5-7 servings of vegetables and fruits per day (fresh or steamed is best), exp. 2 servings of vegetables with lunch and dinner and 2 servings of fruit per day. Berries and greens such as kale and collards are great choices.  -consume on a regular basis: whole grains (make sure first ingredient on label contains the word "whole"), fresh fruits, fish, nuts, seeds, healthy oils (such as olive oil, avocado oil) -may eat small amounts of dairy and lean meat on occasion, but avoid processed meats such as ham, bacon, lunch meat, etc. -drink water -try to avoid fast food and pre-packaged foods, processed meat -most experts advise limiting sodium to < 2300mg  per day, should limit further is any chronic conditions such as high blood pressure, heart disease, diabetes, etc. The American Heart Association advised that < 1500mg  is is ideal -try to avoid foods that contain any ingredients with names you do not recognize  -try to avoid sugar/sweets (except for the natural sugar that occurs in fresh fruit) -try to avoid sweet drinks -try to avoid white rice, white bread, pasta (unless whole grain), white or yellow potatoes  EXERCISE GUIDELINES FOR ADULTS: -if you wish to increase your physical activity, do so gradually and with the approval of your doctor -STOP and seek medical care immediately if you have any chest pain, chest discomfort or trouble breathing when starting or increasing exercise  -move and stretch your body, legs, feet and  arms when sitting for long periods -Physical activity guidelines for optimal health in adults: -least 150 minutes per week of aerobic exercise  (can talk, but not sing) once approved by your doctor, 20-30 minutes of sustained activity or two 10 minute episodes of sustained activity every day.  -resistance training at least 2 days per week if approved by your doctor -balance exercises 3+ days per week:   Stand somewhere where you have something sturdy to hold onto if you lose balance.    1) lift up on toes, start with 5x per day and work up to 20x   2) stand and lift on leg straight out to the side so that foot is a few inches of the floor, start with 5x each side and work up to 20x each side   3) stand on one foot, start with 5 seconds each side and work up to 20 seconds on each side  If you need ideas or help with getting more active:  -Silver sneakers https://tools.silversneakers.com  -Walk with a Doc: http://www.duncan-williams.com/  -try to include resistance (weight lifting/strength building) and balance exercises twice per week: or the following link for ideas: http://castillo-powell.com/  BuyDucts.dk  STRESS MANAGEMENT: -can try meditating, or just sitting quietly with deep breathing while intentionally relaxing all parts of your body for 5 minutes daily -if you need further help with stress, anxiety or depression please follow up with your primary doctor or contact the wonderful folks at [facility]: [phone]  SOCIAL CONNECTIONS: -options in [place] if you wish to engage in more social and exercise related activities:  -Silver sneakers https://tools.silversneakers.com  -Walk with a Doc: http://www.duncan-williams.com/  -Check out the [facility] Active Adults 50+ section on the [place] of [employer] (hiking clubs, book clubs, cards and games, chess, exercise classes, aquatic classes and much more) - see the website for details: https://www.[place].gov/departments/parks-recreation/active-adults50  -YouTube  has lots of exercise videos for different ages and abilities as well  -[facility] (a variety of indoor and outdoor inperson activities for adults). (919)003-5132. [address]  -Virtual Online Classes (a variety of topics): see seniorplanet.org or call [phone]  -consider volunteering at a school, hospice center, church, senior center or elsewhere         ADVANCED HEALTHCARE DIRECTIVES:  [place] Advanced Directives assistance:   ExpressWeek.com.cy  Everyone should have advanced health care directives in place. This is so that you get the care you want, should you ever be in a situation where you are unable to make your own medical decisions.   From the [place] Advanced Directive Website: "Advance Health Care Directives are legal documents in which you give written instructions about your health care if, in the future, you cannot speak for yourself.   A health care power of attorney allows you to name a person you trust to make your health care decisions if you cannot make them yourself. A declaration of a desire for a natural death (or living will) is document, which states that you desire not to have your life prolonged by extraordinary measures if you have a terminal or incurable illness or if you are in a vegetative state. An advance instruction for mental health treatment makes a declaration of instructions, information and preferences regarding your mental health treatment. It also states that you are aware that the advance instruction authorizes a mental health treatment provider to act according to your wishes. It may also outline your consent or refusal of mental health treatment. A declaration of  an anatomical gift allows anyone over the age of 61 to make a gift by will, organ donor card or other document."   Please see the following website or an elder law attorney for forms, FAQs and for completion of  advanced directives: [place] Health Care Directives Advance Health Care Directives (http://guzman.com/)  Or copy and paste the following to your web browser: PoshChat.fi

## 2023-11-03 NOTE — Progress Notes (Signed)
PATIENT CHECK-IN and HEALTH RISK ASSESSMENT QUESTIONNAIRE:  -completed by phone/video for upcoming Medicare Preventive Visit  Pre-Visit Check-in: 1)Vitals (height, wt, BP, etc) - record in vitals section for visit on day of visit Request home vitals (wt, BP, etc.) and enter into vitals, THEN update Vital Signs SmartPhrase below at the top of the HPI. See below.  2)Review and Update Medications, Allergies PMH, Surgeries, Social history in Epic 3)Hospitalizations in the last year with date/reason? n  4)Review and Update Care Team (patient's specialists) in Epic 5) Complete PHQ9 in Epic  6) Complete Fall Screening in Epic 7)Review all Health Maintenance Due and order under PCP if not done.  Medicare Wellness Patient Questionnaire:  Answer theses question about your habits: How often do you have a drink containing alcohol?very rarely - usually one Have you ever smoked? Smokes cigars some On average, how many days per week do you engage in moderate to strenuous exercise (like a brisk walk)? Walks 3-4 times a week for more than 10 minutes.   Wife does cooking  Beverages: coffee, water, no sugar dr. Reino Kent  Answer theses question about your everyday activities: Can you perform most household chores?y Are you deaf or have significant trouble hearing?has hearing aid, doesn't need often Do you feel that you have a problem with memory?no Do you feel safe at home? yes Last dentist visit? yes 8. Do you have any difficulty performing your everyday activities?n Are you having any difficulty walking, taking medications on your own, and or difficulty managing daily home needs?n Do you have difficulty walking or climbing stairs?n Do you have difficulty dressing or bathing?n Do you have difficulty doing errands alone such as visiting a doctor's office or shopping?n Do you currently have any difficulty preparing food and eating?n Do you currently have any difficulty using the toilet?n Do you have  any difficulty managing your finances?n Do you have any difficulties with housekeeping of managing your housekeeping?n   Do you have Advanced Directives in place (Living Will, Healthcare Power or Attorney)? no   Last eye Exam and location? Can't remember last appt, thinks in the last 1 year and sees - saw Dr. Emily Filbert last year in february   Do you currently use prescribed or non-prescribed narcotic or opioid pain medications? no  Do you have a history or close family history of breast, ovarian, tubal or peritoneal cancer or a family member with BRCA (breast cancer susceptibility 1 and 2) gene mutations?      ----------------------------------------------------------------------------------------------------------------------------------------------------------------------------------------------------------------------  Because this visit was a virtual/telehealth visit, some criteria may be missing or patient reported. Any vitals not documented were not able to be obtained and vitals that have been documented are patient reported.    MEDICARE ANNUAL PREVENTIVE CARE VISIT WITH PROVIDER (Welcome to Medicare, initial annual wellness or annual wellness exam)  Virtual Visit via Video  Note  I connected with Rodney Martinez on 11/03/23  by a video enabled telemedicine application and verified that I am speaking with the correct person using two identifiers.  Location patient: home Location provider:work or home office Persons participating in the virtual visit: patient, provider  Concerns and/or follow up today: I saw him 12/23 and he has gotten the colonoscopy we advised at the time. Stable. Denies any new concerns. Has some frequent BMs in the mornings with taking metformin - has always had. Wonders about this - not really much of a nuisance.    See HM section in Epic for other details of completed HM.  ROS: negative for report of fevers, unintentional weight loss, vision changes,  vision loss, hearing loss or change, chest pain, sob, hemoptysis, melena, hematochezia, hematuria, falls, bleeding or bruising, thoughts of suicide or self harm, memory loss  Patient-completed extensive health risk assessment - reviewed and discussed with the patient: See Health Risk Assessment completed with patient prior to the visit either above or in recent phone note. This was reviewed in detailed with the patient today and appropriate recommendations, orders and referrals were placed as needed per Summary below and patient instructions.   Review of Medical History: -PMH, PSH, Family History and current specialty and care providers reviewed and updated and listed below   Patient Care Team: Kristian Covey, MD as PCP - General [place] Chard, DO as Consulting Physician (Neurology) Doreatha Massed, MD as Consulting Physician (Hematology and Oncology) Verner Chol, [facility] (Inactive) as Pharmacist (Pharmacist) Janalyn Harder, MD (Inactive) as Consulting Physician (Dermatology)   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   left upper back - tx p bx   SCC (squamous cell carcinoma) 09/22/2018   left upper outer eyelid - CX3 + 5FU   Sleep apnea    uses CPAP    Thyroid disease    TRANSAMINASES, SERUM, ELEVATED 12/12/2009    Past Surgical History:  Procedure Laterality Date   BASAL CELL CARCINOMA EXCISION  2007   face (Mohs)   CATARACT EXTRACTION Right 08/2019   COLONOSCOPY  04/15/2012   HERNIA REPAIR  1998   umbilical   POLYPECTOMY      Social History   Socioeconomic History   Marital status: Married    Spouse name: Not on file   Number of children: 1   Years of education: vet school   Highest education level: Professional school degree  (e.g., MD, DDS, DVM, JD)  Occupational History   Occupation: retired International aid/development worker  Tobacco Use   Smoking status: Every Day    Types: Cigars   Smokeless tobacco: Never   Tobacco comments:    Pt states that he smokes 6 cigars a day, he smoked cigarettes for 10 years.  Vaping Use   Vaping status: Never Used  Substance and Sexual Activity   Alcohol use: Not Currently    Comment: rare   Drug use: No   Sexual activity: Not on file  Other Topics Concern   Not on file  Social History Narrative   Lives with wife in a 2 story home.  Has one child. Retired International aid/development worker.     Right handed    Social Drivers of Health   Financial Resource Strain: Low Risk  (05/13/2021)   Overall Financial Resource Strain (CARDIA)    Difficulty of Paying Living Expenses: Not hard at all  Food Insecurity: No Food Insecurity (05/13/2021)   Hunger Vital Sign    Worried About Running Out of Food in the Last Year: Never true    Ran Out of Food in the Last Year: Never true  Transportation Needs: No Transportation Needs (05/13/2021)   PRAPARE - Administrator, Civil Service (Medical): No    Lack of Transportation (Non-Medical): No  Physical Activity: Inactive (05/13/2021)   Exercise Vital Sign    Days of Exercise per Week: 0 days  Minutes of Exercise per Session: 0 min  Stress: No Stress Concern Present (05/13/2021)   [employer] of Occupational Health - Occupational Stress Questionnaire    Feeling of Stress : Not at all  Social Connections: Moderately Isolated (05/13/2021)   Social Connection and Isolation Panel [NHANES]    Frequency of Communication with Friends and Family: Three times a week    Frequency of Social Gatherings with Friends and Family: Three times a week    Attends Religious Services: Never    Active Member of Clubs or Organizations: No    Attends Banker Meetings: Never    Marital Status: Married  Catering manager Violence: Not At Risk (05/13/2021)   Humiliation,  Afraid, Rape, and Kick questionnaire    Fear of Current or Ex-Partner: No    Emotionally Abused: No    Physically Abused: No    Sexually Abused: No    Family History  Problem Relation Age of Onset   Diabetes Mother        type ll   Heart disease Father        ?atrial fibrillation   Diabetes Maternal Grandfather    Diabetes Paternal Grandmother    Diabetes Paternal Grandfather    Healthy Sister    Diabetes Brother    Healthy Daughter    Colon cancer Neg Hx    Stomach cancer Neg Hx    Colon polyps Neg Hx    Esophageal cancer Neg Hx    Rectal cancer Neg Hx     Current Outpatient Medications on File Prior to Visit  Medication Sig Dispense Refill   Calcium-Magnesium-Vitamin D (CALCIUM 500 PO) Take 1 tablet by mouth at bedtime.      doxycycline (VIBRAMYCIN) 100 MG capsule Take 1 capsule (100 mg total) by mouth 2 (two) times daily. 14 capsule 0   Emollient (DERMEND BRUISE FORMULA EX) Apply topically 2 (two) times daily as needed.     glucose blood (ACCU-CHEK GUIDE) test strip Test once daily.  Dx e11.9 100 each 3   JARDIANCE 10 MG TABS tablet Take 1 tablet by mouth once daily 90 tablet 0   levothyroxine (SYNTHROID) 25 MCG tablet TAKE 1 TABLET BY MOUTH ONCE DAILY WITH BREAKFAST . APPOINTMENT REQUIRED FOR FUTURE REFILLS 90 tablet 0   Melatonin 10 MG TABS Take 5 mg by mouth at bedtime.     metFORMIN (GLUCOPHAGE-XR) 750 MG 24 hr tablet TAKE 1 TABLET BY MOUTH IN THE MORNING AND 1 TABLET AT BEDTIME 180 tablet 0   Multiple Vitamin (MULTIVITAMIN) tablet Take 1 tablet by mouth daily.     mycophenolate (CELLCEPT) 500 MG tablet TAKE 1 & 1/2 (ONE & ONE-HALF) TABLETS BY MOUTH TWICE DAILY 270 tablet 3   rosuvastatin (CRESTOR) 10 MG tablet Take 1 tablet by mouth once daily 90 tablet 0   tamsulosin (FLOMAX) 0.4 MG CAPS capsule Take 1 capsule by mouth once daily 90 capsule 0   Turmeric (QC TUMERIC COMPLEX) 500 MG CAPS Take 1,000 mg by mouth.     vitamin B-12 (CYANOCOBALAMIN) 500 MCG tablet Take 500  mcg by mouth daily.     No current facility-administered medications on file prior to visit.    No Known Allergies     Physical Exam Vitals requested from patient and listed below if patient had equipment and was able to obtain at home for this virtual visit: There were no vitals filed for this visit. Estimated body mass index is 34.8 kg/m as calculated from the following:  Height as of 03/16/23: 5\' 10"  (1.778 m).   Weight as of 03/16/23: 242 lb 8 oz (110 kg).  EKG (optional): deferred due to virtual visit  GENERAL: alert, oriented, no acute distress detected; full vision exam deferred due to pandemic and/or virtual encounter  HEENT: atraumatic, conjunttiva clear, no obvious abnormalities on inspection of external nose and ears  NECK: normal movements of the head and neck  LUNGS: on inspection no signs of respiratory distress, breathing rate appears normal, no obvious gross SOB, gasping or wheezing  CV: no obvious cyanosis  MS: moves all visible extremities without noticeable abnormality  PSYCH/NEURO: pleasant and cooperative, no obvious depression or anxiety, speech and thought processing grossly intact, Cognitive function grossly intact  Flowsheet Row Video Visit from 09/25/2022 in [facility] [place] [facility]  PHQ-9 Total Score 1           11/03/2023    1:14 PM 09/25/2022    4:56 PM 07/14/2022   10:26 AM 01/08/2022   10:45 AM 05/13/2021    9:57 AM  Depression screen PHQ 2/9  Decreased Interest 0 0 0 2 0  Down, Depressed, Hopeless 0 0 0 0 0  PHQ - 2 Score 0 0 0 2 0  Altered sleeping  0  2   Tired, decreased energy  1  3   Change in appetite  0  1   Feeling bad or failure about yourself   0  0   Trouble concentrating  0  0   Moving slowly or fidgety/restless  0  2   Suicidal thoughts  0  1   PHQ-9 Score  1  11   Difficult doing work/chores  Not difficult at all  Somewhat difficult        07/14/2022   10:26 AM 09/25/2022    4:56 PM 02/23/2023     9:40 AM 10/27/2023   11:25 AM 11/03/2023    1:14 PM  Fall Risk  Falls in the past year? 0 0 1 0 0  Was there an injury with Fall? 0 0 0 0 0  Fall Risk Category Calculator 0 0 1  0  Fall Risk Category (Retired) Low Low     (RETIRED) Patient Fall Risk Level Low fall risk Low fall risk     Patient at Risk for Falls Due to No Fall Risks      Fall risk Follow up Falls evaluation completed  Falls evaluation completed  Falls evaluation completed;Education provided     SUMMARY AND PLAN:  Encounter for Medicare annual wellness exam  Discussed applicable health maintenance/preventive health measures and advised and referred or ordered per patient preferences: -discussed recommendations for vaccines due, he knows can get at the pharmacy if wishes -discussed labs due and sent message to office to schedule routine follow up as is due, can also due foot exam then -discussed doing self foot exams Health Maintenance  Topic Date Due   Zoster Vaccines- Shingrix (1 of 2) [birth year]07/1969   DTaP/Tdap/Td (2 - Tdap) 12/13/2019   COVID-19 Vaccine (3 - Moderna risk series) 01/11/2020   Diabetic kidney evaluation - Urine ACR  09/25/2021   INFLUENZA VACCINE  05/07/2023   FOOT EXAM  07/15/2023   HEMOGLOBIN A1C  09/15/2023   OPHTHALMOLOGY EXAM  11/26/2023   Diabetic kidney evaluation - eGFR measurement  03/15/2024   Medicare Annual Wellness (AWV)  11/02/2024   Colonoscopy  02/04/2026   Pneumonia Vaccine 57+ Years old  Completed   Hepatitis C  Screening  Completed   HPV VACCINES  Aged [employer] and counseling on the following was provided based on the above review of health and a plan/checklist for the patient, along with additional information discussed, was provided for the patient in the patient instructions :  -Advised on importance of completing advanced directives, discussed options for completing and provided information in patient instructions as well -Provided afe balance exercises that can be  done at home to improve balance and discussed exercise guidelines for adults with include balance exercises at least 3 days per week - see patient instructions. -Advised and counseled on a healthy lifestyle - including the importance of a healthy diet, regular physical activity. -Reviewed patient's current diet. Advised and counseled on a whole foods based healthy diet. A summary of a healthy diet was provided in the Patient Instructions.  -reviewed patient's current physical activity level and discussed exercise guidelines for adults.encouraged to try to up the number of days and minutes of walking.  Provided community resources and ideas for safe exercise at home to assist in meeting exercise guideline recommendations in a safe and healthy way.  -Advise yearly dental visits at minimum and regular eye exams -Advised and counseled on alcohol safe limit/risks and tobacco use Follow up: see patient instructions   Patient Instructions  I really enjoyed getting to talk with you today! I am available on Tuesdays and Thursdays for virtual visits if you have any questions or concerns, or if I can be of any further assistance.   CHECKLIST FROM ANNUAL WELLNESS VISIT:  -Follow up (please call to schedule if not scheduled after visit):   -yearly for annual wellness visit with primary care office  Here is a list of your preventive care/health maintenance measures and the plan for each if any are due:  PLAN For any measures below that may be due:   Health Maintenance  Topic Date Due   Zoster Vaccines- Shingrix (1 of 2) 10/07/1969, can get at pharmacy, let us know if you do.   DTaP/Tdap/Td (2 - Tdap) 12/13/2019, can get at pharmacy, let us know if you do.   COVID-19 Vaccine (3 - Moderna risk series) 01/11/2020, can get at pharmacy, let us know if you do.   Diabetic kidney evaluation - Urine ACR  09/25/2021, can get at follow up visit with Dr. B   INFLUENZA VACCINE  05/07/2023, can get at pharmacy, let  us know if you do.   FOOT EXAM  07/15/2023, please request at office visit   HEMOGLOBIN A1C  09/15/2023, ca get at office visit with Dr. Leonard Schwartz   OPHTHALMOLOGY EXAM  11/26/2023 - schedule your annual eye exam   Diabetic kidney evaluation - eGFR measurement  03/15/2024   Medicare Annual Wellness (AWV)  11/02/2024   Colonoscopy  02/04/2026   Pneumonia Vaccine 25+ Years old  Completed   Hepatitis C Screening  Completed   HPV VACCINES  Aged Out    -See a dentist at least yearly  -Get your eyes checked and then per your eye specialist's recommendations  -Other issues addressed today:   -I have included below further information regarding a healthy whole foods based diet, physical activity guidelines for adults, stress management and opportunities for social connections. I hope you find this information useful.   -----------------------------------------------------------------------------------------------------------------------------------------------------------------------------------------------------------------------------------------------------------  NUTRITION: -eat real food: lots of colorful vegetables (half the plate) and fruits -5-7 servings of vegetables and fruits per day (fresh or steamed is best), exp. 2 servings of  vegetables with lunch and dinner and 2 servings of fruit per day. Berries and greens such as kale and collards are great choices.  -consume on a regular basis: whole grains (make sure first ingredient on label contains the word "whole"), fresh fruits, fish, nuts, seeds, healthy oils (such as olive oil, avocado oil) -may eat small amounts of dairy and lean meat on occasion, but avoid processed meats such as ham, bacon, lunch meat, etc. -drink water -try to avoid fast food and pre-packaged foods, processed meat -most experts advise limiting sodium to < 2300mg  per day, should limit further is any chronic conditions such as high blood pressure, heart disease, diabetes,  etc. The American Heart Association advised that < 1500mg  is is ideal -try to avoid foods that contain any ingredients with names you do not recognize  -try to avoid sugar/sweets (except for the natural sugar that occurs in fresh fruit) -try to avoid sweet drinks -try to avoid white rice, white bread, pasta (unless whole grain), white or yellow potatoes  EXERCISE GUIDELINES FOR ADULTS: -if you wish to increase your physical activity, do so gradually and with the approval of your doctor -STOP and seek medical care immediately if you have any chest pain, chest discomfort or trouble breathing when starting or increasing exercise  -move and stretch your body, legs, feet and arms when sitting for long periods -Physical activity guidelines for optimal health in adults: -least 150 minutes per week of aerobic exercise (can talk, but not sing) once approved by your doctor, 20-30 minutes of sustained activity or two 10 minute episodes of sustained activity every day.  -resistance training at least 2 days per week if approved by your doctor -balance exercises 3+ days per week:   Stand somewhere where you have something sturdy to hold onto if you lose balance.    1) lift up on toes, start with 5x per day and work up to 20x   2) stand and lift on leg straight out to the side so that foot is a few inches of the floor, start with 5x each side and work up to 20x each side   3) stand on one foot, start with 5 seconds each side and work up to 20 seconds on each side  If you need ideas or help with getting more active:  -Silver sneakers https://tools.silversneakers.com  -Walk with a Doc: http://www.duncan-williams.com/  -try to include resistance (weight lifting/strength building) and balance exercises twice per week: or the following link for ideas: http://castillo-powell.com/  BuyDucts.dk  STRESS MANAGEMENT: -can try  meditating, or just sitting quietly with deep breathing while intentionally relaxing all parts of your body for 5 minutes daily -if you need further help with stress, anxiety or depression please follow up with your primary doctor or contact the wonderful folks at [facility]: [phone]  SOCIAL CONNECTIONS: -options in [place] if you wish to engage in more social and exercise related activities:  -Silver sneakers https://tools.silversneakers.com  -Walk with a Doc: http://www.duncan-williams.com/  -Check out the [facility] Active Adults 50+ section on the [place] of [employer] (hiking clubs, book clubs, cards and games, chess, exercise classes, aquatic classes and much more) - see the website for details: https://www.[place].gov/departments/parks-recreation/active-adults50  -YouTube has lots of exercise videos for different ages and abilities as well  -[facility] (a variety of indoor and outdoor inperson activities for adults). [phone]. [address]  -Virtual Online Classes (a variety of topics): see seniorplanet.org or call [phone]  -consider volunteering at a  school, hospice center, church, senior center or elsewhere         ADVANCED HEALTHCARE DIRECTIVES:  [place] Advanced Directives assistance:   ExpressWeek.com.cy  Everyone should have advanced health care directives in place. This is so that you get the care you want, should you ever be in a situation where you are unable to make your own medical decisions.   From the [place] Advanced Directive Website: "Advance Health Care Directives are legal documents in which you give written instructions about your health care if, in the future, you cannot speak for yourself.   A health care power of attorney allows you to name a person you trust to make your health care decisions if you cannot make them yourself. A declaration  of a desire for a natural death (or living will) is document, which states that you desire not to have your life prolonged by extraordinary measures if you have a terminal or incurable illness or if you are in a vegetative state. An advance instruction for mental health treatment makes a declaration of instructions, information and preferences regarding your mental health treatment. It also states that you are aware that the advance instruction authorizes a mental health treatment provider to act according to your wishes. It may also outline your consent or refusal of mental health treatment. A declaration of an anatomical gift allows anyone over the age of 88 to make a gift by will, organ donor card or other document."   Please see the following website or an elder law attorney for forms, FAQs and for completion of advanced directives: [place] TEFL teacher Health Care Directives Advance Health Care Directives (http://guzman.com/)  Or copy and paste the following to your web browser: PoshChat.fi    Terressa Koyanagi, DO

## 2023-11-09 ENCOUNTER — Encounter: Payer: Self-pay | Admitting: Family Medicine

## 2023-11-09 ENCOUNTER — Ambulatory Visit (INDEPENDENT_AMBULATORY_CARE_PROVIDER_SITE_OTHER): Payer: Medicare Other | Admitting: Family Medicine

## 2023-11-09 VITALS — BP 100/60 | HR 60 | Temp 97.9°F | Wt 254.3 lb

## 2023-11-09 DIAGNOSIS — E039 Hypothyroidism, unspecified: Secondary | ICD-10-CM | POA: Diagnosis not present

## 2023-11-09 DIAGNOSIS — E785 Hyperlipidemia, unspecified: Secondary | ICD-10-CM | POA: Diagnosis not present

## 2023-11-09 DIAGNOSIS — E1165 Type 2 diabetes mellitus with hyperglycemia: Secondary | ICD-10-CM | POA: Diagnosis not present

## 2023-11-09 DIAGNOSIS — Z7984 Long term (current) use of oral hypoglycemic drugs: Secondary | ICD-10-CM | POA: Diagnosis not present

## 2023-11-09 LAB — POCT GLYCOSYLATED HEMOGLOBIN (HGB A1C): Hemoglobin A1C: 6.7 % — AB (ref 4.0–5.6)

## 2023-11-09 NOTE — Progress Notes (Signed)
Established Patient Office Visit  Subjective   Patient ID: Rodney Martinez, male    DOB: 1951-08-02  Age: 73 y.o. MRN: [id number]  Chief Complaint  Patient presents with   Medical Management of Chronic Issues    HPI   Mr. Sobieski is seen for medical follow-up.  He has history of GERD, hypothyroidism, type 2 diabetes, myasthenia gravis, BPH, dyslipidemia.  Generally doing well.  He feels like his myasthenia is currently stable on CellCept.  Followed by neurology.  Diabetes has been fairly well-controlled.  He remains on metformin extended release twice daily as well as Jardiance.  He does continue to have occasional loose stools with the metformin but symptoms are very inconsistent.  He does not feel like this is severe enough to warrant coming off medication yet.  He remains on rosuvastatin for hyperlipidemia.  Lipids were checked in June and well-controlled.  He has hypothyroidism treated with levothyroxine.  TSH in June stable.  His weight is up some compared with last visit but he thinks some of this is related to wintertime and being less active.  He also had more close on today for weighing.  He did receive recent Shingrix and Tdap just couple days ago.  No history of RSV vaccine.  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   left upper back - tx p bx   SCC (squamous cell carcinoma) 09/22/2018   left upper outer eyelid - CX3 + 5FU   Sleep apnea    uses CPAP    Thyroid disease    TRANSAMINASES, SERUM, ELEVATED 12/12/2009   Past Surgical History:  Procedure Laterality Date   BASAL CELL CARCINOMA EXCISION  2007   face (Mohs)   CATARACT EXTRACTION Right 08/2019   COLONOSCOPY  04/15/2012   HERNIA REPAIR  1998   umbilical   POLYPECTOMY      reports  that he has been smoking cigars. He has never used smokeless tobacco. He reports that he does not currently use alcohol. He reports that he does not use drugs. family history includes Diabetes in his brother, maternal grandfather, mother, paternal grandfather, and paternal grandmother; Healthy in his daughter and sister; Heart disease in his father. No Known Allergies  Review of Systems  Constitutional:  Negative for malaise/fatigue.  Eyes:  Negative for blurred vision.  Respiratory:  Negative for shortness of breath.   Cardiovascular:  Negative for chest pain.  Neurological:  Negative for dizziness, weakness and headaches.      Objective:     BP 100/60 (BP Location: Left Arm, Patient Position: Sitting, Cuff Size: Large)   Pulse 60   Temp 97.9 F (36.6 C) (Oral)   Wt 254 lb 4.8 oz (115.3 kg)   SpO2 96%   BMI 36.49 kg/m  BP Readings from Last 3 Encounters:  11/09/23 100/60  10/02/23 124/80  03/16/23 118/60   Wt Readings from Last 3 Encounters:  11/09/23 254 lb 4.8 oz (115.3 kg)  03/16/23 242 lb 8 oz (110 kg)  02/23/23 241 lb (109.3 kg)      Physical Exam Vitals reviewed.  Constitutional:      General: He is not in acute distress.    Appearance: He is well-developed. He is not ill-appearing.  HENT:  Right Ear: External ear normal.     Left Ear: External ear normal.  Eyes:     Pupils: Pupils are equal, round, and reactive to light.  Neck:     Thyroid: No thyromegaly.  Cardiovascular:     Rate and Rhythm: Normal rate and regular rhythm.  Pulmonary:     Effort: Pulmonary effort is normal. No respiratory distress.     Breath sounds: Normal breath sounds. No wheezing or rales.  Musculoskeletal:     Cervical back: Neck supple.     Comments: Trace pitting edema lower legs bilaterally which she states is about baseline  Neurological:     Mental Status: He is alert and oriented to person, place, and time.      Results for orders placed or performed in visit on  11/09/23  POC HgB A1c  Result Value Ref Range   Hemoglobin A1C 6.7 (A) 4.0 - 5.6 %   HbA1c POC (<> result, manual entry)     HbA1c, POC (prediabetic range)     HbA1c, POC (controlled diabetic range)      Last CBC Lab Results  Component Value Date   WBC 7.7 03/16/2023   HGB 14.5 03/16/2023   HCT 44.2 03/16/2023   MCV 94.3 03/16/2023   MCH 31.8 10/27/2019   RDW 13.1 03/16/2023   PLT 174.0 03/16/2023   Last metabolic panel Lab Results  Component Value Date   GLUCOSE 107 (H) 03/16/2023   NA 140 03/16/2023   K 4.1 03/16/2023   CL 106 03/16/2023   CO2 25 03/16/2023   BUN 19 03/16/2023   CREATININE 0.86 03/16/2023   GFR 85.34 03/16/2023   CALCIUM 9.2 03/16/2023   PROT 6.8 03/16/2023   ALBUMIN 4.6 03/16/2023   BILITOT 0.4 03/16/2023   ALKPHOS 41 03/16/2023   AST 17 03/16/2023   ALT 24 03/16/2023   ANIONGAP 11 10/27/2019   Last lipids Lab Results  Component Value Date   CHOL 113 03/16/2023   HDL 45.30 03/16/2023   LDLCALC 45 03/16/2023   LDLDIRECT 51.0 10/16/2020   TRIG 110.0 03/16/2023   CHOLHDL 2 03/16/2023   Last hemoglobin A1c Lab Results  Component Value Date   HGBA1C 6.7 (A) 11/09/2023   Last thyroid functions Lab Results  Component Value Date   TSH 4.38 03/16/2023      The ASCVD Risk score (Arnett DK, et al., 2019) failed to calculate for the following reasons:   The valid total cholesterol range is 130 to 320 mg/dL    Assessment & Plan:   #1 type 2 diabetes stable.  A1c is increased just slightly from 6.5 to 6.7% from 6 months ago.  Continue Jardiance and metformin.  If GI symptoms of loose stools become too bothersome we could consider discontinuation of metformin and perhaps low-dose GLP-1 medication.  He would like to continue current regimen at this time.  Set up 90-month follow-up.  Continue yearly diabetic eye exam.  Check urine microalbumin at follow-up  #2 hyperlipidemia treated with rosuvastatin 10 mg daily.  Most recent lipid from June  reviewed with patient.  Lipids adequately controlled with low-dose rosuvastatin.  #3 hypothyroidism.  On levothyroxine 25 mcg daily.  Recheck TSH at follow-up in 6 months  #4 health maintenance.  Consider RSV vaccine.  Continue annual flu vaccine. Return in about 6 months (around 05/08/2024).    Evelena Peat, MD

## 2023-11-09 NOTE — Patient Instructions (Signed)
A1C today 6.7%= stable.  Let's plan on 6 month follow up.

## 2023-11-14 ENCOUNTER — Other Ambulatory Visit: Payer: Self-pay | Admitting: Family Medicine

## 2023-11-26 DIAGNOSIS — L720 Epidermal cyst: Secondary | ICD-10-CM | POA: Diagnosis not present

## 2023-12-04 ENCOUNTER — Other Ambulatory Visit: Payer: Self-pay | Admitting: Family Medicine

## 2023-12-09 ENCOUNTER — Other Ambulatory Visit: Payer: Self-pay | Admitting: Family Medicine

## 2023-12-22 DIAGNOSIS — L728 Other follicular cysts of the skin and subcutaneous tissue: Secondary | ICD-10-CM | POA: Diagnosis not present

## 2023-12-22 DIAGNOSIS — L723 Sebaceous cyst: Secondary | ICD-10-CM | POA: Diagnosis not present

## 2023-12-26 ENCOUNTER — Other Ambulatory Visit: Payer: Self-pay | Admitting: Family Medicine

## 2024-01-05 ENCOUNTER — Other Ambulatory Visit: Payer: Self-pay | Admitting: Family Medicine

## 2024-02-13 ENCOUNTER — Other Ambulatory Visit: Payer: Self-pay | Admitting: Family Medicine

## 2024-02-16 DIAGNOSIS — L570 Actinic keratosis: Secondary | ICD-10-CM | POA: Diagnosis not present

## 2024-02-23 ENCOUNTER — Other Ambulatory Visit

## 2024-02-23 ENCOUNTER — Encounter: Payer: Self-pay | Admitting: Neurology

## 2024-02-23 ENCOUNTER — Ambulatory Visit: Payer: Medicare Other | Admitting: Neurology

## 2024-02-23 VITALS — BP 107/63 | HR 53 | Ht 70.0 in | Wt 247.0 lb

## 2024-02-23 DIAGNOSIS — G7000 Myasthenia gravis without (acute) exacerbation: Secondary | ICD-10-CM

## 2024-02-23 LAB — COMPREHENSIVE METABOLIC PANEL WITH GFR
AG Ratio: 2.2 (calc) (ref 1.0–2.5)
ALT: 24 U/L (ref 9–46)
AST: 17 U/L (ref 10–35)
Albumin: 4.6 g/dL (ref 3.6–5.1)
Alkaline phosphatase (APISO): 39 U/L (ref 35–144)
BUN: 18 mg/dL (ref 7–25)
CO2: 27 mmol/L (ref 20–32)
Calcium: 9.3 mg/dL (ref 8.6–10.3)
Chloride: 107 mmol/L (ref 98–110)
Creat: 0.9 mg/dL (ref 0.70–1.28)
Globulin: 2.1 g/dL (ref 1.9–3.7)
Glucose, Bld: 131 mg/dL — ABNORMAL HIGH (ref 65–99)
Potassium: 4.7 mmol/L (ref 3.5–5.3)
Sodium: 142 mmol/L (ref 135–146)
Total Bilirubin: 0.5 mg/dL (ref 0.2–1.2)
Total Protein: 6.7 g/dL (ref 6.1–8.1)
eGFR: 89 mL/min/{1.73_m2} (ref >=60)

## 2024-02-23 LAB — CBC WITH DIFFERENTIAL/PLATELET
Absolute Lymphocytes: 3378 {cells}/uL (ref 850–3900)
Absolute Monocytes: 540 {cells}/uL (ref 200–950)
Basophils Absolute: 33 {cells}/uL (ref 0–200)
Basophils Relative: 0.4 %
Eosinophils Absolute: 432 {cells}/uL (ref 15–500)
Eosinophils Relative: 5.2 %
HCT: 44.4 % (ref 38.5–50.0)
Hemoglobin: 14.8 g/dL (ref 13.2–17.1)
MCH: 31.4 pg (ref 27.0–33.0)
MCHC: 33.3 g/dL (ref 32.0–36.0)
MCV: 94.3 fL (ref 80.0–100.0)
MPV: 11.3 fL (ref 7.5–12.5)
Monocytes Relative: 6.5 %
Neutro Abs: 3918 {cells}/uL (ref 1500–7800)
Neutrophils Relative %: 47.2 %
Platelets: 161 10*3/uL (ref 140–400)
RBC: 4.71 10*6/uL (ref 4.20–5.80)
RDW: 12.5 % (ref 11.0–15.0)
Total Lymphocyte: 40.7 %
WBC: 8.3 10*3/uL (ref 3.8–10.8)

## 2024-02-23 NOTE — Progress Notes (Signed)
 Follow-up Visit   Date: 02/23/2519    Rodney Martinez MRN: [id number] DOB: 08-05-1951    Rodney Martinez is a 73 y.o. right-handed Caucasian male with OSA, hypothyroidism, and diabetes mellitus returning to the clinic for follow-up of seropositive myasthenia gravis.  The patient was accompanied to the clinic by self.  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 He has minimal evidence of disease manifestation (mild left ptosis) or symptomology.   - 11/2017 presented with generalized weakness (severe) managed with IVIG and prednisone  60mg  - 01/2018 Cellcept  added - 12/2018 Cellcept  increaesd to 750mg  BID - 02/2019 tapered off IVIG - 11/2021 tapered off predisone - 05/2022 mestinon  transitioned to prn  Continue Cellcept  750 twice daily OK to use mestinon  60mg  as needed (not needed to use in the past year) Check CBC with diff and CMP  Return to clinic in 1 year  -------------------------------------------------------------------- History of present illness: Starting in mid January 2018, he developed slurred speech, difficulty swallowing, droopy eyelids, and weakness with moving his tongue. MRI brain was negative.  There was high clinical suspicion for myasthenia so he was started on mestinon  30mg  three times daily; in the interim, AChR antibodies returned positive.  He saw my colleague, Dr. Festus Hubert initially who started him on prednisone  20mg  and increased mestinon  to 60mg  three times daily and is here to establish care with me. Due to lack of improvement, his prednisone  was titrated to 60mg  daily and IVIG was started in March 2018. Within a month, his dysphagia and double vision improved.  In May, slow prednisone  taper was started.  He was noted to have low platelets (127) and referred to hematology for evaluation, especially since he was also started on Cellcept .  By September he was down to prednisone  20mg  and getting IVIG ever 3 weeks.  Over 2019-2020, his IVIG has been tapered to every 8 weeks and eventually stopped in May 2020.  His Cellcept  was increased to 750mg  BID in March 2020.  He continues a slow prednisone  taper and by early 2023 was able to completely taper off prednisone .    UPDATE 05/22/2022:   He has tapered off prednisone  in early 2023 and doing great.  He is compliant with mestinon  60mg  BID and Cellcept  750mg  BID.   UPDATE 02/23/2023:  He is here for follow-up visit.  He has been doing great Cellcept  750mg  twice daily and has not needed to take any mestinon .  No problems with double visions, difficulty with swallow/speech, or limb weakness.  About a month ago, he was outside fertilizing and felt that his legs were not able to keep up so kept stumbling forward which resulted in a fall.  He felt like he lost balance.  There was no loss of consciousness.  He was eventually able to stand up himself.   UPDATE 02/23/2024:  He is here for follow-up visit.  He is doing well and denies any new double vision, difficulty swallow/speech, or limb weakness.  He has not needed to take mestinon  over the past year.  He is very compliant with Cellcept  750mg  twice daily.  Medications:  Current Outpatient Medications on File Prior to Visit  Medication Sig Dispense Refill   Calcium -Magnesium-Vitamin D  (CALCIUM  500 PO) Take 1 tablet by mouth at bedtime.      Emollient (DERMEND BRUISE FORMULA EX) Apply topically 2 (two) times daily as needed.     empagliflozin  (JARDIANCE ) 10 MG TABS tablet Take 1 tablet by mouth once daily 90 tablet 1  glucose blood (ACCU-CHEK GUIDE) test strip Test once daily.  Dx e11.9 100 each 3   levothyroxine  (SYNTHROID ) 25 MCG tablet TAKE 1 TABLET BY MOUTH ONCE DAILY WITH BREAKFAST. APPOINTMENT REQUIRED FOR FUTURE REFILLS 90 tablet 0   Melatonin 10 MG TABS Take 5 mg by mouth at bedtime.     metFORMIN  (GLUCOPHAGE -XR) 750 MG 24 hr tablet TAKE 1 TABLET BY MOUTH IN THE MORNING AND 1 TABLET AT BEDTIME 180 tablet 0    Multiple Vitamin (MULTIVITAMIN) tablet Take 1 tablet by mouth daily.     mycophenolate  (CELLCEPT ) 500 MG tablet TAKE 1 & 1/2 (ONE & ONE-HALF) TABLETS BY MOUTH TWICE DAILY 270 tablet 3   rosuvastatin  (CRESTOR ) 10 MG tablet TAKE 1 TABLET BY MOUTH ONCE DAILY . APPOINTMENT REQUIRED FOR FUTURE REFILLS 90 tablet 0   tamsulosin  (FLOMAX ) 0.4 MG CAPS capsule TAKE 1 CAPSULE BY MOUTH ONCE DAILY APPOINTMENT  REQUIRED  FOR  FUTURE  REFILLS 90 capsule 0   Turmeric (QC TUMERIC COMPLEX) 500 MG CAPS Take 1,000 mg by mouth.     vitamin B-12 (CYANOCOBALAMIN ) 500 MCG tablet Take 500 mcg by mouth daily.     No current facility-administered medications on file prior to visit.    Allergies: No Known Allergies  Vital Signs:  BP 107/63   Pulse (!) 53   Ht 5\' 10"  (1.778 m)   Wt 247 lb (112 kg)   SpO2 96%   BMI 35.44 kg/m     Neurological Exam: MENTAL STATUS including orientation to time, place, person, recent and remote memory, attention span and concentration, language, and fund of knowledge is normal.  Speech is normal, no dysarthria.    CRANIAL NERVES: Normal conjugate, extra-ocular eye movements in all directions of gaze. Mild left ptosis at rest, no worsening with sustained upgaze.  Face is symmetric.  Facial muscles are 5/5 throughout - Orbicularis oris and oculi is 5/5; buccinator 5/5.  Tongue strength is 5/5.    MOTOR:  Motor strength is 5/5 throughout.   COORDINATION/GAIT:  He is able to stand up without pushing off.   Gait narrow based and stable.  Data: AChR binding 1.01*, blocking 61*, modulating 25*  MRI brain wwo contrast 10/27/2017:  1. No acute intracranial process or abnormal enhancement. 2. Mild parenchymal brain volume loss for age.  CT chest w contrast 02/03/2018: No acute chest abnormality. Specifically, no evidence for a mediastinal mass or abnormal thymic tissue. Probable scarring in the left lower lung. Indeterminate 4 mm nodular density in the right middle lobe. No follow-up  needed if patient is low-risk. Non-contrast chest CT can be considered in 12 months if patient is high-risk.   Lab Results  Component Value Date   WBC 7.7 03/16/2023   HGB 14.5 03/16/2023   HCT 44.2 03/16/2023   MCV 94.3 03/16/2023   PLT 174.0 03/16/2023   Lab Results  Component Value Date   CREATININE 0.86 03/16/2023   BUN 19 03/16/2023   NA 140 03/16/2023   K 4.1 03/16/2023   CL 106 03/16/2023   CO2 25 03/16/2023   Lab Results  Component Value Date   HGBA1C 6.7 (A) 11/09/2023   Lab Results  Component Value Date   VITAMINB12 498 07/08/2019      Thank you for allowing me to participate in patient's care.  If I can answer any additional questions, I would be pleased to do so.    Sincerely,    Delano Frate K. Lydia Sams, DO

## 2024-02-24 ENCOUNTER — Ambulatory Visit: Payer: Self-pay | Admitting: Neurology

## 2024-03-29 ENCOUNTER — Other Ambulatory Visit: Payer: Self-pay | Admitting: Family Medicine

## 2024-03-30 ENCOUNTER — Other Ambulatory Visit: Payer: Self-pay | Admitting: Family Medicine

## 2024-05-09 ENCOUNTER — Encounter: Payer: Self-pay | Admitting: Family Medicine

## 2024-05-09 ENCOUNTER — Ambulatory Visit: Payer: Medicare Other | Admitting: Family Medicine

## 2024-05-09 ENCOUNTER — Ambulatory Visit: Payer: Self-pay | Admitting: Family Medicine

## 2024-05-09 VITALS — BP 108/60 | HR 55 | Temp 97.8°F | Wt 245.3 lb

## 2024-05-09 DIAGNOSIS — Z7984 Long term (current) use of oral hypoglycemic drugs: Secondary | ICD-10-CM | POA: Diagnosis not present

## 2024-05-09 DIAGNOSIS — E785 Hyperlipidemia, unspecified: Secondary | ICD-10-CM | POA: Diagnosis not present

## 2024-05-09 DIAGNOSIS — E039 Hypothyroidism, unspecified: Secondary | ICD-10-CM

## 2024-05-09 DIAGNOSIS — E1165 Type 2 diabetes mellitus with hyperglycemia: Secondary | ICD-10-CM

## 2024-05-09 LAB — TSH: TSH: 3.48 u[IU]/mL (ref 0.35–5.50)

## 2024-05-09 LAB — POCT GLYCOSYLATED HEMOGLOBIN (HGB A1C): Hemoglobin A1C: 6.5 % — AB (ref 4.0–5.6)

## 2024-05-09 LAB — LIPID PANEL
Cholesterol: 116 mg/dL (ref 0–200)
HDL: 39.3 mg/dL (ref >39.00)
LDL Cholesterol: 45 mg/dL (ref 0–99)
NonHDL: 76.72
Total CHOL/HDL Ratio: 3
Triglycerides: 157 mg/dL — ABNORMAL HIGH (ref 0.0–149.0)
VLDL: 31.4 mg/dL (ref 0.0–40.0)

## 2024-05-09 NOTE — Progress Notes (Signed)
 Established Patient Office Visit  Subjective   Patient ID: Rodney Martinez, male    DOB: 1950/10/08  Age: 73 y.o. MRN: [id number]  No chief complaint on file.   HPI   Mr Rucinski is seen for medical follow-up.  He has history of type 2 diabetes, obstructive sleep apnea, hypothyroidism, hyperlipidemia, myasthenia gravis, BPH.  Generally doing well.  Recently saw neurology.  Myasthenia stable on CellCept .  Had recent CBC and CMP which were stable.  Other medications include levothyroxine , rosuvastatin , tamsulosin , metformin , Jardiance .  His blood sugars are generally been fairly well-controlled.  Denies any recent chest pains.  Retired from [employer] operational officer about 7 years ago.  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   left upper back - tx p bx   SCC (squamous cell carcinoma) 09/22/2018   left upper outer eyelid - CX3 + 5FU   Sleep apnea    uses CPAP    Thyroid  disease    TRANSAMINASES, SERUM, ELEVATED 12/12/2009   Past Surgical History:  Procedure Laterality Date   BASAL CELL CARCINOMA EXCISION  2007   face (Mohs)   CATARACT EXTRACTION Right 08/2019   COLONOSCOPY  04/15/2012   HERNIA REPAIR  1998   umbilical   POLYPECTOMY      reports that he has been smoking cigars. He has never used smokeless tobacco. He reports that he does not currently use alcohol. He reports that he does not use drugs. family history includes Diabetes in his brother, maternal grandfather, mother, paternal grandfather, and paternal grandmother; Healthy in his daughter and sister; Heart disease in his father. No Known Allergies  Review of Systems   Constitutional:  Negative for chills, fever and malaise/fatigue.  Eyes:  Negative for blurred vision.  Respiratory:  Negative for shortness of breath.   Cardiovascular:  Negative for chest pain.  Neurological:  Negative for dizziness, weakness and headaches.      Objective:     BP 108/60   Pulse (!) 55   Temp 97.8 F (36.6 C) (Oral)   Wt 245 lb 4.8 oz (111.3 kg)   SpO2 94%   BMI 35.20 kg/m    Physical Exam Vitals reviewed.  Constitutional:      General: He is not in acute distress.    Appearance: He is well-developed. He is not ill-appearing.  Eyes:     Pupils: Pupils are equal, round, and reactive to light.  Neck:     Thyroid : No thyromegaly.  Cardiovascular:     Rate and Rhythm: Normal rate and regular rhythm.  Pulmonary:     Effort: Pulmonary effort is normal. No respiratory distress.     Breath sounds: Normal breath sounds. No wheezing or rales.  Musculoskeletal:     Cervical back: Neck supple.  Skin:    Comments: Feet reveal no skin lesions. Good distal foot pulses. Good capillary refill. No calluses. Normal sensation with monofilament testing   Neurological:     Mental Status: He is alert and oriented to person, place, and time.      Results for orders placed or performed in visit on 05/09/24  POC  HgB A1c  Result Value Ref Range   Hemoglobin A1C 6.5 (A) 4.0 - 5.6 %   HbA1c POC (<> result, manual entry)     HbA1c, POC (prediabetic range)     HbA1c, POC (controlled diabetic range)      Last CBC Lab Results  Component Value Date   WBC 8.3 02/23/2024   HGB 14.8 02/23/2024   HCT 44.4 02/23/2024   MCV 94.3 02/23/2024   MCH 31.4 02/23/2024   RDW 12.5 02/23/2024   PLT 161 02/23/2024   Last metabolic panel Lab Results  Component Value Date   GLUCOSE 131 (H) 02/23/2024   NA 142 02/23/2024   K 4.7 02/23/2024   CL 107 02/23/2024   CO2 27 02/23/2024   BUN 18 02/23/2024   CREATININE 0.90 02/23/2024   EGFR 89 02/23/2024   CALCIUM  9.3 02/23/2024    PROT 6.7 02/23/2024   ALBUMIN 4.6 03/16/2023   BILITOT 0.5 02/23/2024   ALKPHOS 41 03/16/2023   AST 17 02/23/2024   ALT 24 02/23/2024   ANIONGAP 11 10/27/2019   Last lipids Lab Results  Component Value Date   CHOL 113 03/16/2023   HDL 45.30 03/16/2023   LDLCALC 45 03/16/2023   LDLDIRECT 51.0 10/16/2020   TRIG 110.0 03/16/2023   CHOLHDL 2 03/16/2023   Last hemoglobin A1c Lab Results  Component Value Date   HGBA1C 6.5 (A) 05/09/2024      The ASCVD Risk score (Arnett DK, et al., 2019) failed to calculate for the following reasons:   The valid total cholesterol range is 130 to 320 mg/dL    Assessment & Plan:   Problem List Items Addressed This Visit       Unprioritized   Dyslipidemia   Relevant Orders   Lipid panel   Hypothyroid   Relevant Orders   TSH   Type 2 diabetes mellitus with hyperglycemia (HCC) - Primary   Relevant Orders   POC HgB A1c (Completed)  Diabetes well-controlled with A1c today 6.5%.  Continue metformin  and Jardiance .  Continue yearly diabetic eye exam.  Foot exam unremarkable  -Hyperlipidemia treated with rosuvastatin .  Recent hepatic panel normal.  Check lipid panel  -Hypothyroidism treated with levothyroxine  25 mcg daily.  Recheck TSH.  Routine follow-up in 6 months and sooner as needed  Return in about 6 months (around 11/09/2024).    Wolm Scarlet, MD

## 2024-05-09 NOTE — Patient Instructions (Signed)
 A1C well controlled at 6.5%  We will call with lab results.

## 2024-05-10 ENCOUNTER — Other Ambulatory Visit: Payer: Self-pay | Admitting: Family Medicine

## 2024-05-16 ENCOUNTER — Other Ambulatory Visit: Payer: Self-pay | Admitting: Family Medicine

## 2024-05-16 MED ORDER — ROSUVASTATIN CALCIUM 10 MG PO TABS: ORAL_TABLET | ORAL | 2 refills | Status: AC

## 2024-05-16 NOTE — Telephone Encounter (Signed)
 Copied from CRM [id number]. Topic: Clinical - Medication Refill >> May 16, 2024  1:57 PM [place] E wrote: Medication: rosuvastatin (CRESTOR) 10 MG tablet  Has the patient contacted their pharmacy? Yes (Agent: If no, request that the patient contact the pharmacy for the refill. If patient does not wish to contact the pharmacy document the reason why and proceed with request.) (Agent: If yes, when and what did the pharmacy advise?)  This is the patient's preferred pharmacy:  [facility] [address] [place] - [address] [place] [address] Phone: [phone] Fax: [phone]  Is this the correct pharmacy for this prescription? Yes If no, delete pharmacy and type the correct one.   Has the prescription been filled recently? No  Is the patient out of the medication? No, 10 days left.  Has the patient been seen for an appointment in the last year OR does the patient have an upcoming appointment? Yes  Can we respond through MyChart? Yes  Agent: Please be advised that Rx refills may take up to 3 business days. We ask that you follow-up with your pharmacy.

## 2024-05-16 NOTE — Telephone Encounter (Signed)
 Copied from CRM [id number]. Topic: Clinical - Medication Refill >> May 16, 2024  1:57 PM [place] E wrote: Medication: rosuvastatin  (CRESTOR ) 10 MG tablet  Has the patient contacted their pharmacy? Yes (Agent: If no, request that the patient contact the pharmacy for the refill. If patient does not wish to contact the pharmacy document the reason why and proceed with request.) (Agent: If yes, when and what did the pharmacy advise?)  This is the patient's preferred pharmacy:  [facility] [address] [place] - [address] [place] [address] Phone: [phone] Fax: [phone]  Is this the correct pharmacy for this prescription? Yes If no, delete pharmacy and type the correct one.   Has the prescription been filled recently? No  Is the patient out of the medication? No, 10 days left.  Has the patient been seen for an appointment in the last year OR does the patient have an upcoming appointment? Yes  Can we respond through MyChart? Yes  Agent: Please be advised that Rx refills may take up to 3 business days. We ask that you follow-up with your pharmacy.

## 2024-05-31 LAB — MICROALBUMIN / CREATININE URINE RATIO
Albumin/Creatinine Ratio, Urine, POC: <30
Creatinine, POC: 0.69 mg/dL
EGFR: 90

## 2024-07-19 ENCOUNTER — Other Ambulatory Visit: Payer: Self-pay | Admitting: Family Medicine

## 2024-09-09 ENCOUNTER — Other Ambulatory Visit: Payer: Self-pay | Admitting: Neurology

## 2024-10-02 ENCOUNTER — Other Ambulatory Visit: Payer: Self-pay | Admitting: Family Medicine

## 2024-11-08 ENCOUNTER — Other Ambulatory Visit: Payer: Self-pay | Admitting: Family Medicine

## 2024-11-10 ENCOUNTER — Telehealth: Payer: Self-pay | Admitting: Family Medicine

## 2024-11-10 NOTE — Telephone Encounter (Signed)
 Copied from CRM [id number]. Topic: General - Other >> Nov 10, 2024  3:12 PM Deleta S wrote: Reason for CRM: returning mykal called

## 2024-11-11 NOTE — Telephone Encounter (Signed)
 Patient calling again to speak with Mykal, is requesting a callback. Can be reached at [phone]

## 2025-02-28 ENCOUNTER — Ambulatory Visit: Admitting: Neurology
